# Patient Record
Sex: Male | Born: 1945 | Race: White | Hispanic: No | Marital: Married | Smoking: Former smoker
Health system: Southern US, Community
[De-identification: ages and names within clinical notes are randomized; demographics above are authoritative.]

## PROBLEM LIST (undated history)

## (undated) DIAGNOSIS — I5032 Chronic diastolic (congestive) heart failure: Secondary | ICD-10-CM

## (undated) DIAGNOSIS — I480 Paroxysmal atrial fibrillation: Secondary | ICD-10-CM

## (undated) DIAGNOSIS — G4733 Obstructive sleep apnea (adult) (pediatric): Secondary | ICD-10-CM

## (undated) DIAGNOSIS — F0280 Dementia in other diseases classified elsewhere without behavioral disturbance: Secondary | ICD-10-CM

## (undated) DIAGNOSIS — I639 Cerebral infarction, unspecified: Secondary | ICD-10-CM

## (undated) DIAGNOSIS — I152 Hypertension secondary to endocrine disorders: Secondary | ICD-10-CM

## (undated) DIAGNOSIS — I2510 Atherosclerotic heart disease of native coronary artery without angina pectoris: Secondary | ICD-10-CM

## (undated) DIAGNOSIS — N529 Male erectile dysfunction, unspecified: Secondary | ICD-10-CM

## (undated) DIAGNOSIS — I219 Acute myocardial infarction, unspecified: Secondary | ICD-10-CM

## (undated) DIAGNOSIS — R931 Abnormal findings on diagnostic imaging of heart and coronary circulation: Secondary | ICD-10-CM

## (undated) DIAGNOSIS — Z955 Presence of coronary angioplasty implant and graft: Secondary | ICD-10-CM

## (undated) DIAGNOSIS — E1169 Type 2 diabetes mellitus with other specified complication: Secondary | ICD-10-CM

## (undated) DIAGNOSIS — I5189 Other ill-defined heart diseases: Secondary | ICD-10-CM

## (undated) DIAGNOSIS — E119 Type 2 diabetes mellitus without complications: Secondary | ICD-10-CM

## (undated) DIAGNOSIS — I255 Ischemic cardiomyopathy: Secondary | ICD-10-CM

## (undated) DIAGNOSIS — R053 Chronic cough: Secondary | ICD-10-CM

## (undated) DIAGNOSIS — I872 Venous insufficiency (chronic) (peripheral): Secondary | ICD-10-CM

## (undated) DIAGNOSIS — G309 Alzheimer's disease, unspecified: Secondary | ICD-10-CM

## (undated) DIAGNOSIS — I252 Old myocardial infarction: Secondary | ICD-10-CM

## (undated) DIAGNOSIS — IMO0002 Reserved for concepts with insufficient information to code with codable children: Secondary | ICD-10-CM

## (undated) DIAGNOSIS — M109 Gout, unspecified: Secondary | ICD-10-CM

## (undated) DIAGNOSIS — R9430 Abnormal result of cardiovascular function study, unspecified: Secondary | ICD-10-CM

## (undated) DIAGNOSIS — L308 Other specified dermatitis: Secondary | ICD-10-CM

## (undated) DIAGNOSIS — N400 Enlarged prostate without lower urinary tract symptoms: Secondary | ICD-10-CM

## (undated) DIAGNOSIS — G4730 Sleep apnea, unspecified: Secondary | ICD-10-CM

## (undated) DIAGNOSIS — E291 Testicular hypofunction: Secondary | ICD-10-CM

## (undated) DIAGNOSIS — R269 Unspecified abnormalities of gait and mobility: Secondary | ICD-10-CM

## (undated) HISTORY — DX: Benign prostatic hyperplasia without lower urinary tract symptoms: N40.0

## (undated) HISTORY — DX: Acute myocardial infarction, unspecified: I21.9

## (undated) HISTORY — DX: Cerebral infarction, unspecified: I63.9

## (undated) HISTORY — DX: Testicular hypofunction: E29.1

## (undated) HISTORY — DX: Male erectile dysfunction, unspecified: N52.9

## (undated) HISTORY — PX: OTHER SURGICAL HISTORY: SHX169

---

## 2007-11-18 DIAGNOSIS — I639 Cerebral infarction, unspecified: Secondary | ICD-10-CM

## 2007-11-18 HISTORY — DX: Cerebral infarction, unspecified: I63.9

## 2008-01-29 ENCOUNTER — Ambulatory Visit: Payer: Self-pay | Admitting: Ophthalmology

## 2008-02-15 ENCOUNTER — Ambulatory Visit: Payer: Self-pay | Admitting: Internal Medicine

## 2011-10-21 ENCOUNTER — Ambulatory Visit: Payer: Self-pay | Admitting: Gastroenterology

## 2013-02-10 ENCOUNTER — Emergency Department: Payer: Self-pay | Admitting: Emergency Medicine

## 2014-02-21 ENCOUNTER — Ambulatory Visit: Payer: Self-pay | Admitting: Internal Medicine

## 2014-02-21 LAB — CBC CANCER CENTER
BASOS PCT: 1.1 %
Basophil #: 0.1 x10 3/mm (ref 0.0–0.1)
EOS ABS: 0.1 x10 3/mm (ref 0.0–0.7)
Eosinophil %: 1.9 %
HCT: 40.7 % (ref 40.0–52.0)
HGB: 13.7 g/dL (ref 13.0–18.0)
LYMPHS ABS: 1.4 x10 3/mm (ref 1.0–3.6)
Lymphocyte %: 26 %
MCH: 28.4 pg (ref 26.0–34.0)
MCHC: 33.6 g/dL (ref 32.0–36.0)
MCV: 84 fL (ref 80–100)
MONO ABS: 0.4 x10 3/mm (ref 0.2–1.0)
MONOS PCT: 6.6 %
NEUTROS ABS: 3.6 x10 3/mm (ref 1.4–6.5)
Neutrophil %: 64.4 %
Platelet: 171 x10 3/mm (ref 150–440)
RBC: 4.83 10*6/uL (ref 4.40–5.90)
RDW: 14.2 % (ref 11.5–14.5)
WBC: 5.5 x10 3/mm (ref 3.8–10.6)

## 2014-02-21 LAB — RETICULOCYTES
ABSOLUTE RETIC COUNT: 0.098 10*6/uL (ref 0.019–0.186)
Reticulocyte: 2 % (ref 0.4–3.1)

## 2014-02-21 LAB — IRON AND TIBC
IRON BIND. CAP.(TOTAL): 320 ug/dL (ref 250–450)
IRON SATURATION: 26 %
Iron: 82 ug/dL (ref 65–175)
Unbound Iron-Bind.Cap.: 238 ug/dL

## 2014-02-21 LAB — FERRITIN: Ferritin (ARMC): 29 ng/mL (ref 8–388)

## 2014-03-17 ENCOUNTER — Ambulatory Visit: Payer: Self-pay | Admitting: Internal Medicine

## 2014-03-21 LAB — CBC CANCER CENTER
BASOS ABS: 0 x10 3/mm (ref 0.0–0.1)
BASOS PCT: 0.7 %
EOS ABS: 0.1 x10 3/mm (ref 0.0–0.7)
Eosinophil %: 2.4 %
HCT: 41.6 % (ref 40.0–52.0)
HGB: 14.4 g/dL (ref 13.0–18.0)
Lymphocyte #: 1.5 x10 3/mm (ref 1.0–3.6)
Lymphocyte %: 29.6 %
MCH: 29 pg (ref 26.0–34.0)
MCHC: 34.6 g/dL (ref 32.0–36.0)
MCV: 84 fL (ref 80–100)
MONO ABS: 0.3 x10 3/mm (ref 0.2–1.0)
Monocyte %: 6.8 %
Neutrophil #: 3 x10 3/mm (ref 1.4–6.5)
Neutrophil %: 60.5 %
PLATELETS: 141 x10 3/mm — AB (ref 150–440)
RBC: 4.95 10*6/uL (ref 4.40–5.90)
RDW: 14.3 % (ref 11.5–14.5)
WBC: 4.9 x10 3/mm (ref 3.8–10.6)

## 2014-04-17 ENCOUNTER — Ambulatory Visit: Payer: Self-pay | Admitting: Internal Medicine

## 2014-04-18 ENCOUNTER — Ambulatory Visit: Payer: Self-pay | Admitting: Internal Medicine

## 2014-04-18 LAB — CBC CANCER CENTER
BASOS ABS: 0.1 x10 3/mm (ref 0.0–0.1)
BASOS PCT: 1 %
Eosinophil #: 0.1 x10 3/mm (ref 0.0–0.7)
Eosinophil %: 2.1 %
HCT: 41.5 % (ref 40.0–52.0)
HGB: 14 g/dL (ref 13.0–18.0)
LYMPHS PCT: 28.2 %
Lymphocyte #: 1.5 x10 3/mm (ref 1.0–3.6)
MCH: 28.7 pg (ref 26.0–34.0)
MCHC: 33.8 g/dL (ref 32.0–36.0)
MCV: 85 fL (ref 80–100)
MONOS PCT: 7.7 %
Monocyte #: 0.4 x10 3/mm (ref 0.2–1.0)
NEUTROS ABS: 3.3 x10 3/mm (ref 1.4–6.5)
Neutrophil %: 61 %
Platelet: 149 x10 3/mm — ABNORMAL LOW (ref 150–440)
RBC: 4.89 10*6/uL (ref 4.40–5.90)
RDW: 13.9 % (ref 11.5–14.5)
WBC: 5.4 x10 3/mm (ref 3.8–10.6)

## 2014-05-17 ENCOUNTER — Ambulatory Visit: Payer: Self-pay | Admitting: Internal Medicine

## 2014-05-30 ENCOUNTER — Ambulatory Visit: Payer: Self-pay | Admitting: Internal Medicine

## 2014-08-13 DIAGNOSIS — E119 Type 2 diabetes mellitus without complications: Secondary | ICD-10-CM | POA: Insufficient documentation

## 2014-08-13 DIAGNOSIS — E538 Deficiency of other specified B group vitamins: Secondary | ICD-10-CM | POA: Insufficient documentation

## 2014-08-13 DIAGNOSIS — G4733 Obstructive sleep apnea (adult) (pediatric): Secondary | ICD-10-CM | POA: Insufficient documentation

## 2014-08-13 DIAGNOSIS — E1159 Type 2 diabetes mellitus with other circulatory complications: Secondary | ICD-10-CM | POA: Insufficient documentation

## 2014-08-13 DIAGNOSIS — E7800 Pure hypercholesterolemia, unspecified: Secondary | ICD-10-CM | POA: Insufficient documentation

## 2014-08-13 DIAGNOSIS — D61818 Other pancytopenia: Secondary | ICD-10-CM | POA: Insufficient documentation

## 2014-08-13 DIAGNOSIS — I10 Essential (primary) hypertension: Secondary | ICD-10-CM | POA: Insufficient documentation

## 2014-08-13 DIAGNOSIS — E291 Testicular hypofunction: Secondary | ICD-10-CM | POA: Insufficient documentation

## 2014-08-13 DIAGNOSIS — H539 Unspecified visual disturbance: Secondary | ICD-10-CM | POA: Insufficient documentation

## 2014-08-13 DIAGNOSIS — N529 Male erectile dysfunction, unspecified: Secondary | ICD-10-CM | POA: Insufficient documentation

## 2014-08-13 DIAGNOSIS — I69998 Other sequelae following unspecified cerebrovascular disease: Secondary | ICD-10-CM

## 2014-09-20 DIAGNOSIS — Z8673 Personal history of transient ischemic attack (TIA), and cerebral infarction without residual deficits: Secondary | ICD-10-CM | POA: Insufficient documentation

## 2014-09-20 DIAGNOSIS — R0681 Apnea, not elsewhere classified: Secondary | ICD-10-CM | POA: Insufficient documentation

## 2014-09-20 DIAGNOSIS — R079 Chest pain, unspecified: Secondary | ICD-10-CM | POA: Insufficient documentation

## 2014-11-17 DIAGNOSIS — I219 Acute myocardial infarction, unspecified: Secondary | ICD-10-CM

## 2014-11-17 HISTORY — PX: CARDIAC CATHETERIZATION: SHX172

## 2014-11-17 HISTORY — DX: Acute myocardial infarction, unspecified: I21.9

## 2015-01-25 DIAGNOSIS — G3184 Mild cognitive impairment, so stated: Secondary | ICD-10-CM | POA: Insufficient documentation

## 2015-01-25 DIAGNOSIS — F0390 Unspecified dementia without behavioral disturbance: Secondary | ICD-10-CM | POA: Insufficient documentation

## 2015-01-25 DIAGNOSIS — F09 Unspecified mental disorder due to known physiological condition: Secondary | ICD-10-CM | POA: Insufficient documentation

## 2015-02-20 DIAGNOSIS — Z79899 Other long term (current) drug therapy: Secondary | ICD-10-CM | POA: Insufficient documentation

## 2015-06-27 ENCOUNTER — Encounter: Payer: Self-pay | Admitting: *Deleted

## 2015-07-05 ENCOUNTER — Encounter: Payer: Self-pay | Admitting: Urology

## 2015-07-05 ENCOUNTER — Ambulatory Visit: Payer: Self-pay | Admitting: Urology

## 2015-07-17 ENCOUNTER — Encounter: Payer: Self-pay | Admitting: Urology

## 2015-07-17 ENCOUNTER — Ambulatory Visit (INDEPENDENT_AMBULATORY_CARE_PROVIDER_SITE_OTHER): Payer: Medicare Other | Admitting: Urology

## 2015-07-17 ENCOUNTER — Other Ambulatory Visit: Payer: Self-pay | Admitting: Urology

## 2015-07-17 ENCOUNTER — Telehealth: Payer: Self-pay

## 2015-07-17 VITALS — BP 151/84 | HR 63 | Ht 71.75 in | Wt 278.5 lb

## 2015-07-17 DIAGNOSIS — N528 Other male erectile dysfunction: Secondary | ICD-10-CM

## 2015-07-17 DIAGNOSIS — N529 Male erectile dysfunction, unspecified: Secondary | ICD-10-CM

## 2015-07-17 DIAGNOSIS — N401 Enlarged prostate with lower urinary tract symptoms: Secondary | ICD-10-CM | POA: Diagnosis not present

## 2015-07-17 DIAGNOSIS — N138 Other obstructive and reflux uropathy: Secondary | ICD-10-CM | POA: Insufficient documentation

## 2015-07-17 MED ORDER — SILDENAFIL CITRATE 20 MG PO TABS: 20.0000 mg | ORAL_TABLET | ORAL | Status: DC | PRN

## 2015-07-17 MED ORDER — TAMSULOSIN HCL 0.4 MG PO CAPS: 0.4000 mg | ORAL_CAPSULE | Freq: Every day | ORAL | Status: DC

## 2015-07-17 NOTE — Telephone Encounter (Signed)
Pt called stating he went to have his generic cialis filled and the script currently says one daily. Per pt the pharmacist told him the script needs to say 3-5 prior to intercourse or prn. Please advise.

## 2015-07-17 NOTE — Progress Notes (Signed)
07/17/2015 10:36 AM   [name] 1946/10/11 [id number]  Referring provider: No referring provider defined for this encounter.  Chief Complaint  Patient presents with  . Erectile Dysfunction    1 year recheck  . Benign Prostatic Hypertrophy    HPI: Patient is a 69 year old white male with erectile dysfunction and BPH with LUTS who presents today for one year recheck.   BPH with LUTS His IPSS score today is 5, which is mild lower urinary tract symptomatology. He is mostly satisfied with his quality life due to his urinary symptoms.     His major complaint today nocturia 1.  He has had these symptoms for many years.  He denies any dysuria, hematuria or suprapubic pain.   He currently taking tamsulosin 0.4 mg daily.     He also denies any recent UTI's, fevers, chills, nausea or vomiting.  He does not have a family history of PCa.      IPSS      07/17/15 0800       International Prostate Symptom Score   How often have you had the sensation of not emptying your bladder? Less than 1 in 5     How often have you had to urinate less than every two hours? Less than 1 in 5 times     How often have you found you stopped and started again several times when you urinated? Less than 1 in 5 times     How often have you found it difficult to postpone urination? Less than 1 in 5 times     How often have you had a weak urinary stream? Not at All     How often have you had to strain to start urination? Not at All     How many times did you typically get up at night to urinate? 1 Time     Total IPSS Score 5     Quality of Life due to urinary symptoms   If you were to spend the rest of your life with your urinary condition just the way it is now how would you feel about that? Mostly Satisfied        Score:  1-7 Mild 8-19 Moderate 20-35 Severe   Erectile dysfunction His SHIM score is 5, which is severe ED.   He has been having difficulty with erections for several years.   His  major complaint is maintaining erections.  His libido is preserved.   His risk factors for ED are BPH, hypertension, advanced age, history of stroke and memory loss .  He denies any painful erections or curvatures with his erections.   He has tried PDE 5 inhibitors and they are effective, but he finds them cost prohibitive.  He is currently taking sildenafil 20 mg once daily.      SHIM      07/17/15 0851       SHIM: Over the last 6 months:   How do you rate your confidence that you could get and keep an erection? Very Low     When you had erections with sexual stimulation, how often were your erections hard enough for penetration (entering your partner)? Almost Never or Never     During sexual intercourse, how often were you able to maintain your erection after you had penetrated (entered) your partner? Extremely Difficult     During sexual intercourse, how difficult was it to maintain your erection to completion of intercourse? Extremely Difficult  When you attempted sexual intercourse, how often was it satisfactory for you? Extremely Difficult     SHIM Total Score   SHIM 5        Score: 1-7 Severe ED 8-11 Moderate ED 12-16 Mild-Moderate ED 17-21 Mild ED 22-25 No ED   PMH: Past Medical History  Diagnosis Date  . Stroke   . Erectile dysfunction   . BPH (benign prostatic hyperplasia)   . Erectile dysfunction   . Hypogonadism in male     Surgical History: Past Surgical History  Procedure Laterality Date  . Abdominal cyst surgery      Home Medications:    Medication List       This list is accurate as of: 07/17/15 10:36 AM.  Always use your most recent med list.               allopurinol 300 MG tablet  Commonly known as:  ZYLOPRIM  Take 300 mg by mouth daily.     aspirin 81 MG tablet  Take 81 mg by mouth daily.     cyanocobalamin 1000 MCG/ML injection  Commonly known as:  (VITAMIN B-12)  Inject into the muscle.     dipyridamole-aspirin 200-25 MG per 12  hr capsule  Commonly known as:  AGGRENOX  Take 1 capsule by mouth 2 (two) times daily.     donepezil 5 MG tablet  Commonly known as:  ARICEPT     ramipril 10 MG capsule  Commonly known as:  ALTACE  Take 10 mg by mouth daily.     sildenafil 20 MG tablet  Commonly known as:  REVATIO  Take 20 mg by mouth 3 (three) times daily.     simvastatin 40 MG tablet  Commonly known as:  ZOCOR  Take 40 mg by mouth daily.     tamsulosin 0.4 MG Caps capsule  Commonly known as:  FLOMAX  Take 1 capsule (0.4 mg total) by mouth daily after supper.     Vitamin D (Cholecalciferol) 1000 UNITS Caps  Take by mouth daily.        Allergies: No Known Allergies  Family History: Family History  Problem Relation Age of Onset  . Kidney disease Neg Hx   . Prostate cancer Neg Hx   . Bladder Cancer Neg Hx     Social History:  reports that he has quit smoking. He does not have any smokeless tobacco history on file. He reports that he does not drink alcohol or use illicit drugs.  ROS: UROLOGY Frequent Urination?: No Hard to postpone urination?: No Burning/pain with urination?: No Get up at night to urinate?: No Leakage of urine?: No Urine stream starts and stops?: No Trouble starting stream?: No Do you have to strain to urinate?: No Blood in urine?: No Urinary tract infection?: No Sexually transmitted disease?: No Injury to kidneys or bladder?: No Painful intercourse?: No Weak stream?: No Erection problems?: No Penile pain?: No  Gastrointestinal Nausea?: No Vomiting?: No Indigestion/heartburn?: No Diarrhea?: No Constipation?: No  Constitutional Fever: No Night sweats?: No Weight loss?: No Fatigue?: No  Skin Skin rash/lesions?: No Itching?: No  Eyes Blurred vision?: No Double vision?: No  Ears/Nose/Throat Sore throat?: No Sinus problems?: No  Hematologic/Lymphatic Swollen glands?: No Easy bruising?: No  Cardiovascular Leg swelling?: No Chest pain?:  No  Respiratory Cough?: No Shortness of breath?: No  Endocrine Excessive thirst?: No  Musculoskeletal Back pain?: No Joint pain?: No  Neurological Headaches?: No Dizziness?: No  Psychologic Depression?: No Anxiety?: No  Physical Exam: BP 151/84 mmHg  Pulse 63  Ht 5' 11.75" (1.822 m)  Wt 278 lb 8 oz (126.327 kg)  BMI 38.05 kg/m2  GU: Patient with circumcised.  Urethral meatus is patent.  No penile discharge. No penile lesions or rashes. Scrotum without lesions, cysts, rashes and/or edema.  Testicles are located scrotally bilaterally. No masses are appreciated in the testicles. Left and right epididymis are normal. Rectal: Patient with  normal sphincter tone. Perineum without scarring or rashes. No rectal masses are appreciated. Prostate is approximately 45 grams, no nodules are appreciated. Seminal vesicles are normal.   Laboratory Data: Lab Results  Component Value Date   WBC 5.4 04/18/2014   HGB 14.0 04/18/2014   HCT 41.5 04/18/2014   MCV 85 04/18/2014   PLT 149* 04/18/2014   PSA history:  1.4 ng/mL on 12/22/2012             1.3 ng/mL on 07/04/2013  1.5 ng/mL on 07/04/2014   Assessment & Plan:    1. BPH (benign prostatic hyperplasia) with LUTS:   Patient's IPSS score is 5/2.   His DRE demonstrates mild enlargement, no nodules.  He will continue the tamsulosin 0.4 mg daily. I have sent a refill to his pharmacy  He will follow up in 12 months for a PSA, DRE and an IPSS.    - PSA  2. Erectile dysfunction:   SHIM score is 5, which is severe ED.  He is only taking one sildenafil 25 mg tablet daily which is subtherapeutic.   I explained to him that he would need to take 3-5 tablets of the sildenafil 30 minutes prior to intercourse on an empty stomach.  Patient will contact us with results. He will return in 1 year for [facility] IM score and symptom recheck.   Return in about 1 year (around 07/16/2016) for IPSS and SHIM.  Michiel Cowboy, PA-C  [facility] [address], [address] [place], [place] [address] [phone]

## 2015-07-17 NOTE — Telephone Encounter (Signed)
Attempted to contact the pt, no answer. LMOM to return my call.  

## 2015-07-17 NOTE — Telephone Encounter (Signed)
I spoke w/ pt and informed him that he could pick up a prescription for the Sildenafil , # 50 at the office.

## 2015-07-17 NOTE — Telephone Encounter (Signed)
The medication is sildenafil. It is 20 mg daily he needs to take 3-5 tablets 30 minutes prior to intercourse on an empty stomach.

## 2015-07-17 NOTE — Patient Instructions (Signed)
Sildenafil 20 mg.  Take 3 to 5 tablets prior to 30 minutes prior to intercourse on an empty stomach.

## 2015-07-18 LAB — PSA: Prostate Specific Ag, Serum: 1.3 ng/mL (ref 0.0–4.0)

## 2015-07-18 NOTE — Telephone Encounter (Signed)
One year is fine

## 2015-07-18 NOTE — Telephone Encounter (Signed)
-----   Message from Harle Battiest, PA-C sent at 07/18/2015  1:23 PM EDT ----- Patient's PSA is stable.  We will see him in 6 months.  PSA to be drawn before his next appointment.

## 2015-07-18 NOTE — Telephone Encounter (Signed)
Spoke with pt and made aware of PSA results. Pt stated he made a f/u in 1year. Would that be ok or does he need to come back in 42mo. Please advise.

## 2015-07-19 NOTE — Telephone Encounter (Signed)
Spoke with pt in reference to f/u appt. Pt voiced understanding.  

## 2015-08-15 DIAGNOSIS — E559 Vitamin D deficiency, unspecified: Secondary | ICD-10-CM | POA: Insufficient documentation

## 2015-09-19 ENCOUNTER — Ambulatory Visit
Admission: EM | Admit: 2015-09-19 | Discharge: 2015-09-19 | Payer: Medicare Other | Attending: Family Medicine | Admitting: Family Medicine

## 2015-09-19 ENCOUNTER — Other Ambulatory Visit: Payer: Self-pay

## 2015-09-19 DIAGNOSIS — R079 Chest pain, unspecified: Secondary | ICD-10-CM | POA: Diagnosis present

## 2015-09-19 DIAGNOSIS — Z9889 Other specified postprocedural states: Secondary | ICD-10-CM | POA: Insufficient documentation

## 2015-09-19 DIAGNOSIS — I2129 ST elevation (STEMI) myocardial infarction involving other sites: Secondary | ICD-10-CM | POA: Diagnosis not present

## 2015-09-19 DIAGNOSIS — I213 ST elevation (STEMI) myocardial infarction of unspecified site: Secondary | ICD-10-CM | POA: Diagnosis not present

## 2015-09-19 DIAGNOSIS — Z87891 Personal history of nicotine dependence: Secondary | ICD-10-CM | POA: Diagnosis not present

## 2015-09-19 DIAGNOSIS — Z8673 Personal history of transient ischemic attack (TIA), and cerebral infarction without residual deficits: Secondary | ICD-10-CM | POA: Insufficient documentation

## 2015-09-19 NOTE — ED Notes (Signed)
Patient states he is having chest pain which started at approximately 745am today.

## 2015-09-19 NOTE — ED Provider Notes (Signed)
Subjective:  James GeorgesKenneth [name] is a 69 y.o. male who presents for evaluation of chest pain Onset was around 8am this morning. The patient admits to chest discomfort that is a left sided pressure.  Associated symptom diaphoresis. Patient's cardiac risk factors are obesity, diabetes, and past smoker. Family Hx father dies of MI in 6860s. Patient denies taking any other meds today besides ASA.   Patient Active Problem List   Diagnosis Date Noted  . 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  . 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   Past Medical History  Diagnosis Date  . Stroke   . Erectile dysfunction   . BPH (benign prostatic hyperplasia)   . Erectile dysfunction   . Hypogonadism in male     Past Surgical History  Procedure Laterality Date  . Abdominal cyst surgery       (Not in a hospital admission) No Known Allergies  Social History  Substance Use Topics  . Smoking status: Former Games developermoker  . Smokeless tobacco: Not on file     Comment: "years ago"  . Alcohol Use: No    Family History  Problem Relation Age of Onset  . Kidney disease Neg Hx   . Prostate cancer Neg Hx   . Bladder Cancer Neg Hx     Review of Systems Negative except mentioned above.   Objective:  Patient Vitals for the past 8 hrs:  BP Pulse Resp SpO2  09/19/15 0818 (!) 143/87 mmHg 78 (!) 22 98 %   Vitals as per Epic  GENERAL: NAD HEENT: no pharyngeal erythema, no exudate RESP: CTA B CARD: RRR, no chest wall tenderness NEURO: CN II-XII grossly intact  MSK: -Homans   ECG: sinus rhythm, ST elevation anterior leads, low voltage QRS-suggestive of acute MI   Assessment:  Myocardial Infarction   Plan:  1. ECG Ordered, IV placed, FSBS 191 2. NITRO SL x 2 3. 2L O2 [place], Baby ASA  (3) taken at approx. 8am   4. Nurse to call EMS for transport to ED  Jolene ProvostKirtida Marzella Miracle, MD 09/19/15 [phone]

## 2015-09-21 DIAGNOSIS — R310 Gross hematuria: Secondary | ICD-10-CM | POA: Insufficient documentation

## 2015-09-21 DIAGNOSIS — I2510 Atherosclerotic heart disease of native coronary artery without angina pectoris: Secondary | ICD-10-CM | POA: Insufficient documentation

## 2015-09-21 DIAGNOSIS — I5021 Acute systolic (congestive) heart failure: Secondary | ICD-10-CM | POA: Insufficient documentation

## 2015-09-21 DIAGNOSIS — I213 ST elevation (STEMI) myocardial infarction of unspecified site: Secondary | ICD-10-CM | POA: Insufficient documentation

## 2015-09-22 DIAGNOSIS — R58 Hemorrhage, not elsewhere classified: Secondary | ICD-10-CM | POA: Insufficient documentation

## 2015-09-22 DIAGNOSIS — D62 Acute posthemorrhagic anemia: Secondary | ICD-10-CM | POA: Insufficient documentation

## 2015-10-09 ENCOUNTER — Other Ambulatory Visit: Payer: Self-pay | Admitting: Internal Medicine

## 2015-10-09 DIAGNOSIS — K769 Liver disease, unspecified: Secondary | ICD-10-CM

## 2015-10-10 DIAGNOSIS — I2109 ST elevation (STEMI) myocardial infarction involving other coronary artery of anterior wall: Secondary | ICD-10-CM | POA: Insufficient documentation

## 2015-10-24 ENCOUNTER — Encounter: Payer: Medicare Other | Attending: Cardiology | Admitting: *Deleted

## 2015-10-24 ENCOUNTER — Encounter: Payer: Self-pay | Admitting: *Deleted

## 2015-10-24 VITALS — Ht 70.5 in | Wt 262.3 lb

## 2015-10-24 DIAGNOSIS — I213 ST elevation (STEMI) myocardial infarction of unspecified site: Secondary | ICD-10-CM | POA: Diagnosis not present

## 2015-10-24 DIAGNOSIS — Z9861 Coronary angioplasty status: Secondary | ICD-10-CM | POA: Diagnosis present

## 2015-10-24 DIAGNOSIS — M109 Gout, unspecified: Secondary | ICD-10-CM | POA: Insufficient documentation

## 2015-10-24 DIAGNOSIS — N400 Enlarged prostate without lower urinary tract symptoms: Secondary | ICD-10-CM | POA: Insufficient documentation

## 2015-10-24 NOTE — Addendum Note (Signed)
Addended by: Suszanne FinchWRIGHT, Noella Kipnis P on: 10/24/2015 06:10 PM   Modules accepted: Orders, Medications

## 2015-10-24 NOTE — Progress Notes (Addendum)
Cardiac Individual Treatment Plan  Patient Details  Name: James [name] MRN: [id number] Date of Birth: 06/28/1946 Referring Provider:  Marcina MillardParaschos, Alexander, MD  Initial Encounter Date: Date: 10/24/15  Visit Diagnosis: ST elevation myocardial infarction (STEMI), unspecified artery (HCC) - Plan: CARDIAC REHAB 30 DAY REVIEW  S/P PTCA (percutaneous transluminal coronary angioplasty) - Plan: CARDIAC REHAB 30 DAY REVIEW  Patient's Home Medications on Admission:  Current outpatient prescriptions:  .  aspirin 81 MG tablet, Take 81 mg by mouth daily., Disp: , Rfl:  .  atorvastatin (LIPITOR) 80 MG tablet, Take 1 tablet by mouth., Disp: , Rfl:  .  cyanocobalamin (,VITAMIN B-12,) 1000 MCG/ML injection, Inject into the muscle., Disp: , Rfl:  .  Cyanocobalamin (RA VITAMIN B-12 TR) 1000 MCG TBCR, Take by mouth daily. , Disp: , Rfl:  .  finasteride (PROSCAR) 5 MG tablet, Take by mouth., Disp: , Rfl:  .  metoprolol succinate (TOPROL-XL) 50 MG 24 hr tablet, Take by mouth., Disp: , Rfl:  .  ramipril (ALTACE) 10 MG capsule, Take 10 mg by mouth daily., Disp: , Rfl:  .  tamsulosin (FLOMAX) 0.4 MG CAPS capsule, Take 1 capsule (0.4 mg total) by mouth daily after supper., Disp: 90 capsule, Rfl: 3 .  ticagrelor (BRILINTA) 90 MG TABS tablet, Take by mouth., Disp: , Rfl:  .  Vitamin D, Cholecalciferol, 1000 UNITS CAPS, Take by mouth daily., Disp: , Rfl:   Past Medical History: Past Medical History  Diagnosis Date  . Stroke (HCC)   . Erectile dysfunction   . BPH (benign prostatic hyperplasia)   . Erectile dysfunction   . Hypogonadism in male     Tobacco Use: History  Smoking status  . Former Smoker -- 1.50 packs/day for 25 years  . Quit date: 11/17/1984  Smokeless tobacco  . Never Used    Comment: "years ago"    Labs: Recent Review Flowsheet Data    There is no flowsheet data to display.       Exercise Target Goals: Date: 10/24/15  Exercise Program Goal: Individual exercise prescription set  with THRR, safety & activity barriers. Participant demonstrates ability to understand and report RPE using BORG scale, to self-measure pulse accurately, and to acknowledge the importance of the exercise prescription.  Exercise Prescription Goal: Starting with aerobic activity 30 plus minutes a day, 3 days per week for initial exercise prescription. Provide home exercise prescription and guidelines that participant acknowledges understanding prior to discharge.  Activity Barriers & Risk Stratification:     Activity Barriers & Risk Stratification - 10/24/15 1730    Activity Barriers & Risk Stratification   Activity Barriers None   Risk Stratification High      6 Minute Walk:     6 Minute Walk      10/24/15 1436       6 Minute Walk   Phase Initial     Distance 1300 feet     Walk Time 6 minutes     Resting HR 72 bpm     Resting BP 124/60 mmHg     Max Ex. HR 111 bpm     Max Ex. BP 144/62 mmHg     RPE 8     Symptoms No        Initial Exercise Prescription:     Initial Exercise Prescription - 10/24/15 1400    Date of Initial Exercise Prescription   Date 10/24/15   Treadmill   MPH 2   Grade 0   Minutes 10  Bike   Level 0.2   Minutes 10   Recumbant Bike   Level 3   RPM 40   Watts 25   Minutes 15   NuStep   Level 3   Watts 30   Minutes 15   Arm Ergometer   Level 1   Watts 8   Minutes 10   Arm/Foot Ergometer   Level 4   Watts 15   Minutes 10   Cybex   Level 2   RPM 50   Minutes 10   Recumbant Elliptical   Level 1   RPM 40   Watts 10   Minutes 10   Elliptical   Level 1   Speed 3   Minutes 1   REL-XR   Level 2   Watts 30   Minutes 15   T5 Nustep   Level 2   Watts 15   Minutes 15   Biostep-RELP   Level 2   Watts 15   Minutes 15   Prescription Details   Frequency (times per week) 3   Duration Progress to 30 minutes of continuous aerobic without signs/symptoms of physical distress   Intensity   THRR REST +  30   Ratings of Perceived  Exertion 11-15   Progression Continue progressive overload as per policy without signs/symptoms or physical distress.   Resistance Training   Training Prescription Yes   Weight 2   Reps 10-15      Exercise Prescription Changes:   Discharge Exercise Prescription (Final Exercise Prescription Changes):   Nutrition:  Target Goals: Understanding of nutrition guidelines, daily intake of sodium 1500mg , cholesterol 200mg , calories 30% from fat and 7% or less from saturated fats, daily to have 5 or more servings of fruits and vegetables.  Biometrics:     Pre Biometrics - 10/24/15 1436    Pre Biometrics   Height 5' 10.5" (1.791 m)   Weight 262 lb 4.8 oz (118.978 kg)   Waist Circumference 47.75 inches   Hip Circumference 49 inches   Waist to Hip Ratio 0.97 %   BMI (Calculated) 37.2       Nutrition Therapy Plan and Nutrition Goals:   Nutrition Discharge: Rate Your Plate Scores:   Nutrition Goals Re-Evaluation:   Psychosocial: Target Goals: Acknowledge presence or absence of depression, maximize coping skills, provide positive support system. Participant is able to verbalize types and ability to use techniques and skills needed for reducing stress and depression.  Initial Review & Psychosocial Screening:     Initial Psych Review & Screening - 10/24/15 1741    Family Dynamics   Good Support System? Yes   Barriers   Psychosocial barriers to participate in program There are no identifiable barriers or psychosocial needs.   Screening Interventions   Interventions Encouraged to exercise  Pt. has a good support system.  He has his wife James [name], 2 children and 3 grandchildren. He states he does not feel depressed.  He has an affiliation with a [employer].        Quality of Life Scores:     Quality of Life - 10/24/15 1743    Quality of Life Scores   Health/Function Pre 22.32 %   Socioeconomic Pre 30 %   Psych/Spiritual Pre 27.43 %   Family Pre 28.8 %    GLOBAL Pre 26.02 %      PHQ-9:     Recent Review Flowsheet Data    Depression screen [facility] 2/9 10/24/2015   Decreased Interest  0   Down, Depressed, Hopeless 0   PHQ - 2 Score 0   Altered sleeping 1   Tired, decreased energy 1   Change in appetite 0   Feeling bad or failure about yourself  0   Trouble concentrating 0   Moving slowly or fidgety/restless 0   Suicidal thoughts 0   PHQ-9 Score 2   Difficult doing work/chores Not difficult at all      Psychosocial Evaluation and Intervention:   Psychosocial Re-Evaluation:   Vocational Rehabilitation: Provide vocational rehab assistance to qualifying candidates.   Vocational Rehab Evaluation & Intervention:     Vocational Rehab - 10/24/15 1732    Initial Vocational Rehab Evaluation & Intervention   Assessment shows need for Vocational Rehabilitation No      Education: Education Goals: Education classes will be provided on a weekly basis, covering required topics. Participant will state understanding/return demonstration of topics presented.  Learning Barriers/Preferences:     Learning Barriers/Preferences - 10/24/15 1732    Learning Barriers/Preferences   Learning Barriers Sight;Hearing  Pt. had stroke 7 years ago which affected his  ability to process and respond to questions. Responses are delayed at times.  Also pt has limited peripheral vision.  Patient does not drive at night.     Learning Preferences Written Material      Education Topics: General Nutrition Guidelines/Fats and Fiber: -Group instruction provided by verbal, written material, models and posters to present the general guidelines for heart healthy nutrition. Gives an explanation and review of dietary fats and fiber.   Controlling Sodium/Reading Food Labels: -Group verbal and written material supporting the discussion of sodium use in heart healthy nutrition. Review and explanation with models, verbal and written materials for utilization of the  food label.   Exercise Physiology & Risk Factors: - Group verbal and written instruction with models to review the exercise physiology of the cardiovascular system and associated critical values. Details cardiovascular disease risk factors and the goals associated with each risk factor.   Aerobic Exercise & Resistance Training: - Gives group verbal and written discussion on the health impact of inactivity. On the components of aerobic and resistive training programs and the benefits of this training and how to safely progress through these programs.   Flexibility, Balance, General Exercise Guidelines: - Provides group verbal and written instruction on the benefits of flexibility and balance training programs. Provides general exercise guidelines with specific guidelines to those with heart or lung disease. Demonstration and skill practice provided.   Stress Management: - Provides group verbal and written instruction about the health risks of elevated stress, cause of high stress, and healthy ways to reduce stress.   Depression: - Provides group verbal and written instruction on the correlation between heart/lung disease and depressed mood, treatment options, and the stigmas associated with seeking treatment.   Anatomy & Physiology of the Heart: - Group verbal and written instruction and models provide basic cardiac anatomy and physiology, with the coronary electrical and arterial systems. Review of: AMI, Angina, Valve disease, Heart Failure, Cardiac Arrhythmia, Pacemakers, and the ICD.   Cardiac Procedures: - Group verbal and written instruction and models to describe the testing methods done to diagnose heart disease. Reviews the outcomes of the test results. Describes the treatment choices: Medical Management, Angioplasty, or Coronary Bypass Surgery.   Cardiac Medications: - Group verbal and written instruction to review commonly prescribed medications for heart disease. Reviews the  medication, class of the drug, and side effects. Includes the steps  to properly store meds and maintain the prescription regimen.   Go Sex-Intimacy & Heart Disease, Get SMART - Goal Setting: - Group verbal and written instruction through game format to discuss heart disease and the return to sexual intimacy. Provides group verbal and written material to discuss and apply goal setting through the application of the S.M.A.R.T. Method.   Other Matters of the Heart: - Provides group verbal, written materials and models to describe Heart Failure, Angina, Valve Disease, and Diabetes in the realm of heart disease. Includes description of the disease process and treatment options available to the cardiac patient.   Exercise & Equipment Safety: - Individual verbal instruction and demonstration of equipment use and safety with use of the equipment.          Cardiac Rehab from 10/24/2015 in [facility]iac [facility]   Date  10/24/15   Educator  DW   Instruction Review Code  1- partially meets, needs review/practice      Infection Prevention: - Provides verbal and written material to individual with discussion of infection control including proper hand washing and proper equipment cleaning during exercise session.      Cardiac Rehab from 10/24/2015 in [facility] Cardiac Rehab   Date  10/24/15   Educator  DW   Instruction Review Code  2- meets goals/outcomes      Falls Prevention: - Provides verbal and written material to individual with discussion of falls prevention and safety.      Cardiac Rehab from 10/24/2015 in [facility]   Date  10/24/15   Educator  DW   Instruction Review Code  2- meets goals/outcomes      Diabetes: - Individual verbal and written instruction to review signs/symptoms of diabetes, desired ranges of glucose level fasting, after meals and with exercise. Advice that pre and post exercise glucose checks will be done for 3 sessions at entry of program.      Cardiac Rehab from  10/24/2015 in [facility] Cardiac Rehab   Date  10/24/15   Educator  DW   Instruction Review Code  2- meets goals/outcomes       Knowledge Questionnaire Score:   Personal Goals and Risk Factors at Admission:     Personal Goals and Risk Factors at Admission - 10/24/15 1740    Personal Goals and Risk Factors on Admission    Weight Management Yes   Admit Weight 262 lb 4.8 oz (118.978 kg)   Goal Weight 200 lb (90.719 kg)   Increase Aerobic Exercise and Physical Activity Yes   Intervention While in program, learn and follow the exercise prescription taught. Start at a low level workload and increase workload after able to maintain previous level for 30 minutes. Increase time before increasing intensity.   Take Less Medication Yes   Intervention Learn your risk factors and begin the lifestyle modifications for risk factor control during your time in the program.   Understand more about Heart/Pulmonary Disease. Yes   Intervention While in program utilize professionals for any questions, and attend the education sessions. Great websites to use are www.americanheart.org or www.lung.org for reliable information.   Diabetes Yes   Goal Blood glucose control identified by blood glucose values, HgbA1C. Participant verbalizes understanding of the signs/symptoms of hyper/hypo glycemia, proper foot care and importance of medication and nutrition plan for blood glucose control.   Intervention Provide nutrition & aerobic exercise along with prescribed medications to achieve blood glucose in normal ranges: Fasting 65-99 mg/dL   Hypertension Yes  Goal Participant will see blood pressure controlled within the values of 140/31mm/Hg or within value directed by their physician.   Intervention Provide nutrition & aerobic exercise along with prescribed medications to achieve BP 140/90 or less.   Lipids Yes   Goal Cholesterol controlled with medications as prescribed, with individualized exercise RX and with  personalized nutrition plan. Value goals: LDL < 70mg , HDL > 40mg . Participant states understanding of desired cholesterol values and following prescriptions.   Intervention Provide nutrition & aerobic exercise along with prescribed medications to achieve LDL 70mg , HDL >40mg .   Stress Yes   Goal To meet with psychosocial counselor for stress and relaxation information and guidance. To state understanding of performing relaxation techniques and or identifying personal stressors.   Intervention Provide education on types of stress, identifiying stressors, and ways to cope with stress. Provide demonstration and active practice of relaxation techniques.      Personal Goals and Risk Factors Review:    Personal Goals Discharge (Final Personal Goals and Risk Factors Review):    ITP Comments:   Comments: Today James [name] attended his med review/cardiac rehab orientation. James [name] was accompanied by his wife James [name]. James [name] had a stroke 7 years ago which causes him to have a delay in processing or responding to questions. 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 Staff will need to give him time to respond and may need to repeat things due to hearing impairment. Patient states he does better if he can look directly at the person talking. Patient will start in the Tuesday/Thursday session on November 08, 2015 at 0830.

## 2015-10-24 NOTE — Progress Notes (Deleted)
Cardiac Individual Treatment Plan  Patient Details  Name: James [name] MRN: [id number] Date of Birth: 1946/10/06 Referring Provider:  Marcina Millard, MD  Initial Encounter Date: Date: 10/24/15  Visit Diagnosis: ST elevation myocardial infarction (STEMI), unspecified artery (HCC)  S/P PTCA (percutaneous transluminal coronary angioplasty)  Patient's Home Medications on Admission:  Current outpatient prescriptions:  .  aspirin 81 MG tablet, Take 81 mg by mouth daily., Disp: , Rfl:  .  atorvastatin (LIPITOR) 80 MG tablet, Take 1 tablet by mouth., Disp: , Rfl:  .  cyanocobalamin (,VITAMIN B-12,) 1000 MCG/ML injection, Inject into the muscle., Disp: , Rfl:  .  Cyanocobalamin (RA VITAMIN B-12 TR) 1000 MCG TBCR, Take by mouth daily. , Disp: , Rfl:  .  finasteride (PROSCAR) 5 MG tablet, Take by mouth., Disp: , Rfl:  .  metoprolol succinate (TOPROL-XL) 50 MG 24 hr tablet, Take by mouth., Disp: , Rfl:  .  ramipril (ALTACE) 10 MG capsule, Take 10 mg by mouth daily., Disp: , Rfl:  .  ramipril (ALTACE) 10 MG capsule, Take by mouth., Disp: , Rfl:  .  tamsulosin (FLOMAX) 0.4 MG CAPS capsule, Take 1 capsule (0.4 mg total) by mouth daily after supper., Disp: 90 capsule, Rfl: 3 .  ticagrelor (BRILINTA) 90 MG TABS tablet, Take by mouth., Disp: , Rfl:  .  Vitamin D, Cholecalciferol, 1000 UNITS CAPS, Take by mouth daily., Disp: , Rfl:  .  allopurinol (ZYLOPRIM) 300 MG tablet, Take 300 mg by mouth daily., Disp: , Rfl:  .  dipyridamole-aspirin (AGGRENOX) 200-25 MG per 12 hr capsule, Take 1 capsule by mouth 2 (two) times daily., Disp: , Rfl:  .  donepezil (ARICEPT) 5 MG tablet, , Disp: , Rfl:  .  sildenafil (REVATIO) 20 MG tablet, Take 1 tablet (20 mg total) by mouth continuous as needed (take 2-5 tablets as needed for sexual activity). (Patient not taking: Reported on 10/24/2015), Disp: 50 tablet, Rfl: 0 .  simvastatin (ZOCOR) 40 MG tablet, Take 40 mg by mouth daily., Disp: , Rfl:   Past Medical  History: Past Medical History  Diagnosis Date  . Stroke (HCC)   . Erectile dysfunction   . BPH (benign prostatic hyperplasia)   . Erectile dysfunction   . Hypogonadism in male     Tobacco Use: History  Smoking status  . Former Smoker -- 1.50 packs/day for 25 years  . Quit date: 11/17/1984  Smokeless tobacco  . Never Used    Comment: "years ago"    Labs: Recent Review Flowsheet Data    There is no flowsheet data to display.       Exercise Target Goals: Date: 10/24/15  Exercise Program Goal: Individual exercise prescription set with THRR, safety & activity barriers. Participant demonstrates ability to understand and report RPE using BORG scale, to self-measure pulse accurately, and to acknowledge the importance of the exercise prescription.  Exercise Prescription Goal: Starting with aerobic activity 30 plus minutes a day, 3 days per week for initial exercise prescription. Provide home exercise prescription and guidelines that participant acknowledges understanding prior to discharge.  Activity Barriers & Risk Stratification:     Activity Barriers & Risk Stratification - 10/24/15 1730    Activity Barriers & Risk Stratification   Activity Barriers None   Risk Stratification High      6 Minute Walk:     6 Minute Walk      10/24/15 1436       6 Minute Walk   Phase Initial  Distance 1300 feet     Walk Time 6 minutes     Resting HR 72 bpm     Resting BP 124/60 mmHg     Max Ex. HR 111 bpm     Max Ex. BP 144/62 mmHg     RPE 8     Symptoms No        Initial Exercise Prescription:     Initial Exercise Prescription - 10/24/15 1400    Date of Initial Exercise Prescription   Date 10/24/15   Treadmill   MPH 2   Grade 0   Minutes 10   Bike   Level 0.2   Minutes 10   Recumbant Bike   Level 3   RPM 40   Watts 25   Minutes 15   NuStep   Level 3   Watts 30   Minutes 15   Arm Ergometer   Level 1   Watts 8   Minutes 10   Arm/Foot Ergometer    Level 4   Watts 15   Minutes 10   Cybex   Level 2   RPM 50   Minutes 10   Recumbant Elliptical   Level 1   RPM 40   Watts 10   Minutes 10   Elliptical   Level 1   Speed 3   Minutes 1   REL-XR   Level 2   Watts 30   Minutes 15   T5 Nustep   Level 2   Watts 15   Minutes 15   Biostep-RELP   Level 2   Watts 15   Minutes 15   Prescription Details   Frequency (times per week) 3   Duration Progress to 30 minutes of continuous aerobic without signs/symptoms of physical distress   Intensity   THRR REST +  30   Ratings of Perceived Exertion 11-15   Progression Continue progressive overload as per policy without signs/symptoms or physical distress.   Resistance Training   Training Prescription Yes   Weight 2   Reps 10-15      Exercise Prescription Changes:   Discharge Exercise Prescription (Final Exercise Prescription Changes):   Nutrition:  Target Goals: Understanding of nutrition guidelines, daily intake of sodium 1500mg , cholesterol 200mg , calories 30% from fat and 7% or less from saturated fats, daily to have 5 or more servings of fruits and vegetables.  Biometrics:     Pre Biometrics - 10/24/15 1436    Pre Biometrics   Height 5' 10.5" (1.791 m)   Weight 262 lb 4.8 oz (118.978 kg)   Waist Circumference 47.75 inches   Hip Circumference 49 inches   Waist to Hip Ratio 0.97 %   BMI (Calculated) 37.2       Nutrition Therapy Plan and Nutrition Goals:   Nutrition Discharge: Rate Your Plate Scores:   Nutrition Goals Re-Evaluation:   Psychosocial: Target Goals: Acknowledge presence or absence of depression, maximize coping skills, provide positive support system. Participant is able to verbalize types and ability to use techniques and skills needed for reducing stress and depression.  Initial Review & Psychosocial Screening:     Initial Psych Review & Screening - 10/24/15 1741    Family Dynamics   Good Support System? Yes   Barriers    Psychosocial barriers to participate in program There are no identifiable barriers or psychosocial needs.   Screening Interventions   Interventions Encouraged to exercise  Pt. has a good support system.  He has his wife James [name],  2 children and 3 grandchildren. He states he does not feel depressed.  He has an affiliation with a [employer].        Quality of Life Scores:     Quality of Life - 10/24/15 1743    Quality of Life Scores   Health/Function Pre 22.32 %   Socioeconomic Pre 30 %   Psych/Spiritual Pre 27.43 %   Family Pre 28.8 %   GLOBAL Pre 26.02 %      PHQ-9:     Recent Review Flowsheet Data    Depression screen [facility] 2/9 10/24/2015   Decreased Interest 0   Down, Depressed, Hopeless 0   PHQ - 2 Score 0   Altered sleeping 1   Tired, decreased energy 1   Change in appetite 0   Feeling bad or failure about yourself  0   Trouble concentrating 0   Moving slowly or fidgety/restless 0   Suicidal thoughts 0   PHQ-9 Score 2   Difficult doing work/chores Not difficult at all      Psychosocial Evaluation and Intervention:   Psychosocial Re-Evaluation:   Vocational Rehabilitation: Provide vocational rehab assistance to qualifying candidates.   Vocational Rehab Evaluation & Intervention:     Vocational Rehab - 10/24/15 1732    Initial Vocational Rehab Evaluation & Intervention   Assessment shows need for Vocational Rehabilitation No      Education: Education Goals: Education classes will be provided on a weekly basis, covering required topics. Participant will state understanding/return demonstration of topics presented.  Learning Barriers/Preferences:     Learning Barriers/Preferences - 10/24/15 1732    Learning Barriers/Preferences   Learning Barriers Sight;Hearing  Pt. had stroke 7 years ago which affected his  ability to process and respond to questions. Responses are delayed at times.  Also pt has limited peripheral vision.  Patient does not  drive at night.     Learning Preferences Written Material      Education Topics: General Nutrition Guidelines/Fats and Fiber: -Group instruction provided by verbal, written material, models and posters to present the general guidelines for heart healthy nutrition. Gives an explanation and review of dietary fats and fiber.   Controlling Sodium/Reading Food Labels: -Group verbal and written material supporting the discussion of sodium use in heart healthy nutrition. Review and explanation with models, verbal and written materials for utilization of the food label.   Exercise Physiology & Risk Factors: - Group verbal and written instruction with models to review the exercise physiology of the cardiovascular system and associated critical values. Details cardiovascular disease risk factors and the goals associated with each risk factor.   Aerobic Exercise & Resistance Training: - Gives group verbal and written discussion on the health impact of inactivity. On the components of aerobic and resistive training programs and the benefits of this training and how to safely progress through these programs.   Flexibility, Balance, General Exercise Guidelines: - Provides group verbal and written instruction on the benefits of flexibility and balance training programs. Provides general exercise guidelines with specific guidelines to those with heart or lung disease. Demonstration and skill practice provided.   Stress Management: - Provides group verbal and written instruction about the health risks of elevated stress, cause of high stress, and healthy ways to reduce stress.   Depression: - Provides group verbal and written instruction on the correlation between heart/lung disease and depressed mood, treatment options, and the stigmas associated with seeking treatment.   Anatomy & Physiology of the Heart: -  Group verbal and written instruction and models provide basic cardiac anatomy and  physiology, with the coronary electrical and arterial systems. Review of: AMI, Angina, Valve disease, Heart Failure, Cardiac Arrhythmia, Pacemakers, and the ICD.   Cardiac Procedures: - Group verbal and written instruction and models to describe the testing methods done to diagnose heart disease. Reviews the outcomes of the test results. Describes the treatment choices: Medical Management, Angioplasty, or Coronary Bypass Surgery.   Cardiac Medications: - Group verbal and written instruction to review commonly prescribed medications for heart disease. Reviews the medication, class of the drug, and side effects. Includes the steps to properly store meds and maintain the prescription regimen.   Go Sex-Intimacy & Heart Disease, Get SMART - Goal Setting: - Group verbal and written instruction through game format to discuss heart disease and the return to sexual intimacy. Provides group verbal and written material to discuss and apply goal setting through the application of the S.M.A.R.T. Method.   Other Matters of the Heart: - Provides group verbal, written materials and models to describe Heart Failure, Angina, Valve Disease, and Diabetes in the realm of heart disease. Includes description of the disease process and treatment options available to the cardiac patient.   Exercise & Equipment Safety: - Individual verbal instruction and demonstration of equipment use and safety with use of the equipment.          Cardiac Rehab from 10/24/2015 in [facility] Cardiac Rehab   Date  10/24/15   Educator  DW   Instruction Review Code  1- partially meets, needs review/practice      Infection Prevention: - Provides verbal and written material to individual with discussion of infection control including proper hand washing and proper equipment cleaning during exercise session.      Cardiac Rehab from 10/24/2015 in [facility] Cardiac Rehab   Date  10/24/15   Educator  DW   Instruction Review Code  2- meets  goals/outcomes      Falls Prevention: - Provides verbal and written material to individual with discussion of falls prevention and safety.      Cardiac Rehab from 10/24/2015 in [facility] Cardiac Rehab   Date  10/24/15   Educator  DW   Instruction Review Code  2- meets goals/outcomes      Diabetes: - Individual verbal and written instruction to review signs/symptoms of diabetes, desired ranges of glucose level fasting, after meals and with exercise. Advice that pre and post exercise glucose checks will be done for 3 sessions at entry of program.      Cardiac Rehab from 10/24/2015 in [facility] Cardiac Rehab   Date  10/24/15   Educator  DW   Instruction Review Code  2- meets goals/outcomes       Knowledge Questionnaire Score:   Personal Goals and Risk Factors at Admission:     Personal Goals and Risk Factors at Admission - 10/24/15 1740    Personal Goals and Risk Factors on Admission    Weight Management Yes   Admit Weight 262 lb 4.8 oz (118.978 kg)   Goal Weight 200 lb (90.719 kg)   Increase Aerobic Exercise and Physical Activity Yes   Intervention While in program, learn and follow the exercise prescription taught. Start at a low level workload and increase workload after able to maintain previous level for 30 minutes. Increase time before increasing intensity.   Take Less Medication Yes   Intervention Learn your risk factors and begin the lifestyle modifications for risk factor control during your  time in the program.   Understand more about Heart/Pulmonary Disease. Yes   Intervention While in program utilize professionals for any questions, and attend the education sessions. Great websites to use are www.americanheart.org or www.lung.org for reliable information.   Diabetes Yes   Goal Blood glucose control identified by blood glucose values, HgbA1C. Participant verbalizes understanding of the signs/symptoms of hyper/hypo glycemia, proper foot care and importance of medication and  nutrition plan for blood glucose control.   Intervention Provide nutrition & aerobic exercise along with prescribed medications to achieve blood glucose in normal ranges: Fasting 65-99 mg/dL   Hypertension Yes   Goal Participant will see blood pressure controlled within the values of 140/5490mm/Hg or within value directed by their physician.   Intervention Provide nutrition & aerobic exercise along with prescribed medications to achieve BP 140/90 or less.   Lipids Yes   Goal Cholesterol controlled with medications as prescribed, with individualized exercise RX and with personalized nutrition plan. Value goals: LDL < 70mg , HDL > 40mg . Participant states understanding of desired cholesterol values and following prescriptions.   Intervention Provide nutrition & aerobic exercise along with prescribed medications to achieve LDL 70mg , HDL >40mg .   Stress Yes   Goal To meet with psychosocial counselor for stress and relaxation information and guidance. To state understanding of performing relaxation techniques and or identifying personal stressors.   Intervention Provide education on types of stress, identifiying stressors, and ways to cope with stress. Provide demonstration and active practice of relaxation techniques.      Personal Goals and Risk Factors Review:    Personal Goals Discharge (Final Personal Goals and Risk Factors Review):    ITP Comments:   Comments:  Today James [name] attended his med review/cardiac rehab orientation.  James [name] was accompanied by his wife James FreeLibby.  James [name] had a stroke 7 years ago which causes him to have a delay in processing or responding to questions. 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  Staff will need to give him time to respond and may need to repeat things due to hearing impairment.  Patient states he does better if he can look directly at the person talking.     Patient will start in the Tuesday/Thursday session on November 08, 2015 at 0830.

## 2015-10-24 NOTE — Addendum Note (Signed)
Addended by: Suszanne FinchWRIGHT, Yaqueline Gutter P on: 10/24/2015 06:15 PM   Modules accepted: Orders

## 2015-10-24 NOTE — Patient Instructions (Signed)
Patient Instructions  Patient Details  Name: James GeorgesKenneth Lacross MRN: [id number] Date of Birth: 10/12/1946 Referring Provider:  Marcina MillardParaschos, Alexander, MD  Below are the personal goals you chose as well as exercise and nutrition goals. Our goal is to help you keep on track towards obtaining and maintaining your goals. We will be discussing your progress on these goals with you throughout the program.  Initial Exercise Prescription:     Initial Exercise Prescription - 10/24/15 1400    Date of Initial Exercise Prescription   Date 10/24/15   Treadmill   MPH 2   Grade 0   Minutes 10   Bike   Level 0.2   Minutes 10   Recumbant Bike   Level 3   RPM 40   Watts 25   Minutes 15   NuStep   Level 3   Watts 30   Minutes 15   Arm Ergometer   Level 1   Watts 8   Minutes 10   Arm/Foot Ergometer   Level 4   Watts 15   Minutes 10   Cybex   Level 2   RPM 50   Minutes 10   Recumbant Elliptical   Level 1   RPM 40   Watts 10   Minutes 10   Elliptical   Level 1   Speed 3   Minutes 1   REL-XR   Level 2   Watts 30   Minutes 15   T5 Nustep   Level 2   Watts 15   Minutes 15   Biostep-RELP   Level 2   Watts 15   Minutes 15   Prescription Details   Frequency (times per week) 3   Duration Progress to 30 minutes of continuous aerobic without signs/symptoms of physical distress   Intensity   THRR REST +  30   Ratings of Perceived Exertion 11-15   Progression Continue progressive overload as per policy without signs/symptoms or physical distress.   Resistance Training   Training Prescription Yes   Weight 2   Reps 10-15      Exercise Goals: Frequency: Be able to perform aerobic exercise three times per week working toward 3-5 days per week.  Intensity: Work with a perceived exertion of 11 (fairly light) - 15 (hard) as tolerated. Follow your new exercise prescription and watch for changes in prescription as you progress with the program. Changes will be reviewed with you when they  are made.  Duration: You should be able to do 30 minutes of continuous aerobic exercise in addition to a 5 minute warm-up and a 5 minute cool-down routine.  Nutrition Goals: Your personal nutrition goals will be established when you do your nutrition analysis with the dietician.  The following are nutrition guidelines to follow: Cholesterol < 200mg /day Sodium < 1500mg /day Fiber: Men over 50 yrs - 30 grams per day  Personal Goals:     Personal Goals and Risk Factors at Admission - 10/24/15 1740    Personal Goals and Risk Factors on Admission    Weight Management Yes   Admit Weight 262 lb 4.8 oz (118.978 kg)   Goal Weight 200 lb (90.719 kg)   Increase Aerobic Exercise and Physical Activity Yes   Intervention While in program, learn and follow the exercise prescription taught. Start at a low level workload and increase workload after able to maintain previous level for 30 minutes. Increase time before increasing intensity.   Take Less Medication Yes   Intervention Learn your risk  factors and begin the lifestyle modifications for risk factor control during your time in the program.   Understand more about Heart/Pulmonary Disease. Yes   Intervention While in program utilize professionals for any questions, and attend the education sessions. Great websites to use are www.americanheart.org or www.lung.org for reliable information.   Diabetes Yes   Goal Blood glucose control identified by blood glucose values, HgbA1C. Participant verbalizes understanding of the signs/symptoms of hyper/hypo glycemia, proper foot care and importance of medication and nutrition plan for blood glucose control.   Intervention Provide nutrition & aerobic exercise along with prescribed medications to achieve blood glucose in normal ranges: Fasting 65-99 mg/dL   Hypertension Yes   Goal Participant will see blood pressure controlled within the values of 140/68mm/Hg or within value directed by their physician.    Intervention Provide nutrition & aerobic exercise along with prescribed medications to achieve BP 140/90 or less.   Lipids Yes   Goal Cholesterol controlled with medications as prescribed, with individualized exercise RX and with personalized nutrition plan. Value goals: LDL < , HDL > . Participant states understanding of desired cholesterol values and following prescriptions.   Intervention Provide nutrition & aerobic exercise along with prescribed medications to achieve LDL 70mg , HDL >40mg .   Stress Yes   Goal To meet with psychosocial counselor for stress and relaxation information and guidance. To state understanding of performing relaxation techniques and or identifying personal stressors.   Intervention Provide education on types of stress, identifiying stressors, and ways to cope with stress. Provide demonstration and active practice of relaxation techniques.      Tobacco Use Initial Evaluation: History  Smoking status  . Former Smoker -- 1.50 packs/day for 25 years  . Quit date: 11/17/1984  Smokeless tobacco  . Never Used    Comment: "years ago"    Copy of goals given to participant.

## 2015-10-31 ENCOUNTER — Ambulatory Visit
Admission: RE | Admit: 2015-10-31 | Discharge: 2015-10-31 | Disposition: A | Discharge status: Home / Self Care | Payer: Medicare Other | Source: Ambulatory Visit | Attending: Internal Medicine | Admitting: Internal Medicine

## 2015-10-31 DIAGNOSIS — K7689 Other specified diseases of liver: Secondary | ICD-10-CM | POA: Diagnosis present

## 2015-10-31 DIAGNOSIS — K769 Liver disease, unspecified: Secondary | ICD-10-CM

## 2015-10-31 MED ORDER — GADOBENATE DIMEGLUMINE 529 MG/ML IV SOLN
20.0000 mL | Freq: Once | INTRAVENOUS | Status: AC | PRN
  Administered 2015-10-31: 20 mL via INTRAVENOUS

## 2015-11-08 ENCOUNTER — Encounter: Payer: Medicare Other | Admitting: *Deleted

## 2015-11-08 DIAGNOSIS — Z9861 Coronary angioplasty status: Secondary | ICD-10-CM

## 2015-11-08 DIAGNOSIS — I213 ST elevation (STEMI) myocardial infarction of unspecified site: Secondary | ICD-10-CM | POA: Diagnosis not present

## 2015-11-08 LAB — GLUCOSE, CAPILLARY: Glucose-Capillary: 131 mg/dL — ABNORMAL HIGH (ref 65–99)

## 2015-11-08 NOTE — Progress Notes (Signed)
Daily Session Note  Patient Details  Name: James [name] MRN: [id number] Date of Birth: 09-23-1946 Referring Provider:  Isaias Cowman, MD  Encounter Date: 11/08/2015  Check In:     Session Check In - 11/08/15 0935    Check-In   Staff Present Hessie Knows, BS, Exercise Physiologist;Tyrice Hewitt Amedeo Plenty, BS, ACSM CEP, Exercise Physiologist;Carroll Enterkin, RN, BSN   ER physicians immediately available to respond to emergencies See telemetry face sheet for immediately available ER MD   Medication changes reported     No   Fall or balance concerns reported    No   Warm-up and Cool-down Performed on first and last piece of equipment   VAD Patient? No   Pain Assessment   Currently in Pain? No/denies   Multiple Pain Sites No         Goals Met:  Independence with exercise equipment Exercise tolerated well Personal goals reviewed No report of cardiac concerns or symptoms Strength training completed today  Goals Unmet:  Not Applicable  Goals Comments: Today was the patient's first day of class. The patient's initial exercise prescription (based on the 6 min walk evaluation) was reviewed with the patient.  Patient completed exercise prescription and all exercise goals during rehab session. The exercise was tolerated well and the patient is progressing in the program.     Dr. Emily Filbert is Medical Director for [facility].

## 2015-11-13 DIAGNOSIS — I213 ST elevation (STEMI) myocardial infarction of unspecified site: Secondary | ICD-10-CM

## 2015-11-13 DIAGNOSIS — Z9861 Coronary angioplasty status: Secondary | ICD-10-CM

## 2015-11-13 LAB — GLUCOSE, CAPILLARY
GLUCOSE-CAPILLARY: 204 mg/dL — AB (ref 65–99)
Glucose-Capillary: 135 mg/dL — ABNORMAL HIGH (ref 65–99)

## 2015-11-13 NOTE — Progress Notes (Signed)
Daily Session Note  Patient Details  Name: Addie Cederberg MRN: [id number] Date of Birth: 13-Feb-1946 Referring Provider:  Isaias Cowman, MD  Encounter Date: 11/13/2015  Check In:     Session Check In - 11/13/15 0929    Check-In   Staff Present Candiss Norse, MS, ACSM CEP, Exercise Physiologist;Diane Joya Gaskins, RN, Apolonio Schneiders, BS, Exercise Physiologist   ER physicians immediately available to respond to emergencies See telemetry face sheet for immediately available ER MD   Medication changes reported     No   Fall or balance concerns reported    No   Warm-up and Cool-down Performed on first and last piece of equipment   VAD Patient? No   Pain Assessment   Currently in Pain? No/denies         Goals Met:  Independence with exercise equipment Exercise tolerated well No report of cardiac concerns or symptoms Strength training completed today  Goals Unmet:  Not Applicable  Goals Comments:    Dr. Emily Filbert is Medical Director for [facility].

## 2015-11-15 DIAGNOSIS — I213 ST elevation (STEMI) myocardial infarction of unspecified site: Secondary | ICD-10-CM

## 2015-11-15 DIAGNOSIS — Z9861 Coronary angioplasty status: Secondary | ICD-10-CM

## 2015-11-15 LAB — GLUCOSE, CAPILLARY
GLUCOSE-CAPILLARY: 145 mg/dL — AB (ref 65–99)
Glucose-Capillary: 149 mg/dL — ABNORMAL HIGH (ref 65–99)

## 2015-11-15 NOTE — Progress Notes (Signed)
Daily Session Note  Patient Details  Name: James [name] MRN: [id number] Date of Birth: 04/16/1946 Referring Provider:  Isaias Cowman, MD  Encounter Date: 11/15/2015  Check In:     Session Check In - 11/15/15 0858    Check-In   Staff Present Gerlene Burdock, RN, BSN;Kendall Caprice Beaver, BS, Exercise Physiologist;Bharat Antillon, BS, ACSM EP-C, Exercise Physiologist   ER physicians immediately available to respond to emergencies See telemetry face sheet for immediately available ER MD   Medication changes reported     No   Fall or balance concerns reported    No   Warm-up and Cool-down Performed on first and last piece of equipment   VAD Patient? No   Pain Assessment   Currently in Pain? No/denies         Goals Met:  Proper associated with RPD/PD & O2 Sat Exercise tolerated well No report of cardiac concerns or symptoms Strength training completed today  Goals Unmet:  Not Applicable  Goals Comments:    Dr. Emily Filbert is Medical Director for [facility] and [facility].

## 2015-11-18 NOTE — Progress Notes (Signed)
Cardiac Individual Treatment Plan  Patient Details  Name: Karie GeorgesKenneth Shawler MRN: [id number] Date of Birth: 01/04/1946 Referring Provider:  Marcina MillardParaschos, Alexander, MD  Initial Encounter Date:    Visit Diagnosis: ST elevation myocardial infarction (STEMI), unspecified artery (HCC)  S/P PTCA (percutaneous transluminal coronary angioplasty)  Patient's Home Medications on Admission:  Current outpatient prescriptions:  .  aspirin 81 MG tablet, Take 81 mg by mouth daily., Disp: , Rfl:  .  atorvastatin (LIPITOR) 80 MG tablet, Take 1 tablet by mouth., Disp: , Rfl:  .  cyanocobalamin (,VITAMIN B-12,) 1000 MCG/ML injection, Inject into the muscle., Disp: , Rfl:  .  Cyanocobalamin (RA VITAMIN B-12 TR) 1000 MCG TBCR, Take by mouth daily. , Disp: , Rfl:  .  finasteride (PROSCAR) 5 MG tablet, Take by mouth., Disp: , Rfl:  .  metoprolol succinate (TOPROL-XL) 50 MG 24 hr tablet, Take by mouth., Disp: , Rfl:  .  ramipril (ALTACE) 10 MG capsule, Take 10 mg by mouth daily., Disp: , Rfl:  .  tamsulosin (FLOMAX) 0.4 MG CAPS capsule, Take 1 capsule (0.4 mg total) by mouth daily after supper., Disp: 90 capsule, Rfl: 3 .  ticagrelor (BRILINTA) 90 MG TABS tablet, Take by mouth., Disp: , Rfl:  .  Vitamin D, Cholecalciferol, 1000 UNITS CAPS, Take by mouth daily., Disp: , Rfl:   Past Medical History: Past Medical History  Diagnosis Date  . Stroke (HCC)   . Erectile dysfunction   . BPH (benign prostatic hyperplasia)   . Erectile dysfunction   . Hypogonadism in male     Tobacco Use: History  Smoking status  . Former Smoker -- 1.50 packs/day for 25 years  . Quit date: 11/17/1984  Smokeless tobacco  . Never Used    Comment: "years ago"    Labs: Recent Review Flowsheet Data    There is no flowsheet data to display.       Exercise Target Goals:    Exercise Program Goal: Individual exercise prescription set with THRR, safety & activity barriers. Participant demonstrates ability to understand and report  RPE using BORG scale, to self-measure pulse accurately, and to acknowledge the importance of the exercise prescription.  Exercise Prescription Goal: Starting with aerobic activity 30 plus minutes a day, 3 days per week for initial exercise prescription. Provide home exercise prescription and guidelines that participant acknowledges understanding prior to discharge.  Activity Barriers & Risk Stratification:     Activity Barriers & Risk Stratification - 10/24/15 1730    Activity Barriers & Risk Stratification   Activity Barriers None   Risk Stratification High      6 Minute Walk:     6 Minute Walk      10/24/15 1436       6 Minute Walk   Phase Initial     Distance 1300 feet     Walk Time 6 minutes     Resting HR 72 bpm     Resting BP 124/60 mmHg     Max Ex. HR 111 bpm     Max Ex. BP 144/62 mmHg     RPE 8     Symptoms No        Initial Exercise Prescription:     Initial Exercise Prescription - 10/24/15 1400    Date of Initial Exercise Prescription   Date 10/24/15   Treadmill   MPH 2   Grade 0   Minutes 10   Bike   Level 0.2   Minutes 10   Recumbant Bike  Level 3   RPM 40   Watts 25   Minutes 15   NuStep   Level 3   Watts 30   Minutes 15   Arm Ergometer   Level 1   Watts 8   Minutes 10   Arm/Foot Ergometer   Level 4   Watts 15   Minutes 10   Cybex   Level 2   RPM 50   Minutes 10   Recumbant Elliptical   Level 1   RPM 40   Watts 10   Minutes 10   Elliptical   Level 1   Speed 3   Minutes 1   REL-XR   Level 2   Watts 30   Minutes 15   T5 Nustep   Level 2   Watts 15   Minutes 15   Biostep-RELP   Level 2   Watts 15   Minutes 15   Prescription Details   Frequency (times per week) 3   Duration Progress to 30 minutes of continuous aerobic without signs/symptoms of physical distress   Intensity   THRR REST +  30   Ratings of Perceived Exertion 11-15   Progression Continue progressive overload as per policy without signs/symptoms or  physical distress.   Resistance Training   Training Prescription Yes   Weight 2   Reps 10-15      Exercise Prescription Changes:     Exercise Prescription Changes      11/15/15 1400           Response to Exercise   Blood Pressure (Admit) 128/78 mmHg       Blood Pressure (Exercise) 142/64 mmHg       Blood Pressure (Exit) 124/76 mmHg       Heart Rate (Admit) 99 bpm       Heart Rate (Exercise) 102 bpm       Heart Rate (Exit) 84 bpm       Rating of Perceived Exertion (Exercise) 12       Symptoms none       Duration Progress to 50 minutes of aerobic without signs/symptoms of physical distress       Intensity THRR unchanged       Progression Continue progressive overload as per policy without signs/symptoms or physical distress.       Resistance Training   Training Prescription Yes       Weight 3       Reps 10-15       Interval Training   Interval Training No       Treadmill   MPH 3.2       Grade 0       Minutes 20       T5 Nustep   Level 2       Watts 25       Minutes 10          Discharge Exercise Prescription (Final Exercise Prescription Changes):     Exercise Prescription Changes - 11/15/15 1400    Response to Exercise   Blood Pressure (Admit) 128/78 mmHg   Blood Pressure (Exercise) 142/64 mmHg   Blood Pressure (Exit) 124/76 mmHg   Heart Rate (Admit) 99 bpm   Heart Rate (Exercise) 102 bpm   Heart Rate (Exit) 84 bpm   Rating of Perceived Exertion (Exercise) 12   Symptoms none   Duration Progress to 50 minutes of aerobic without signs/symptoms of physical distress   Intensity THRR unchanged   Progression Continue  progressive overload as per policy without signs/symptoms or physical distress.   Resistance Training   Training Prescription Yes   Weight 3   Reps 10-15   Interval Training   Interval Training No   Treadmill   MPH 3.2   Grade 0   Minutes 20   T5 Nustep   Level 2   Watts 25   Minutes 10      Nutrition:  Target Goals: Understanding  of nutrition guidelines, daily intake of sodium 1500mg , cholesterol 200mg , calories 30% from fat and 7% or less from saturated fats, daily to have 5 or more servings of fruits and vegetables.  Biometrics:     Pre Biometrics - 10/24/15 1436    Pre Biometrics   Height 5' 10.5" (1.791 m)   Weight 262 lb 4.8 oz (118.978 kg)   Waist Circumference 47.75 inches   Hip Circumference 49 inches   Waist to Hip Ratio 0.97 %   BMI (Calculated) 37.2       Nutrition Therapy Plan and Nutrition Goals:   Nutrition Discharge: Rate Your Plate Scores:     Rate Your Plate - 64/33/29 0924    Rate Your Plate Scores   Pre Score 58   Pre Score % 64 %      Nutrition Goals Re-Evaluation:   Psychosocial: Target Goals: Acknowledge presence or absence of depression, maximize coping skills, provide positive support system. Participant is able to verbalize types and ability to use techniques and skills needed for reducing stress and depression.  Initial Review & Psychosocial Screening:     Initial Psych Review & Screening - 10/24/15 1741    Family Dynamics   Good Support System? Yes   Barriers   Psychosocial barriers to participate in program There are no identifiable barriers or psychosocial needs.   Screening Interventions   Interventions Encouraged to exercise  Pt. has a good support system.  He has his wife Almyra Free, 2 children and 3 grandchildren. He states he does not feel depressed.  He has an affiliation with a [employer].        Quality of Life Scores:     Quality of Life - 10/24/15 1743    Quality of Life Scores   Health/Function Pre 22.32 %   Socioeconomic Pre 30 %   Psych/Spiritual Pre 27.43 %   Family Pre 28.8 %   GLOBAL Pre 26.02 %      PHQ-9:     Recent Review Flowsheet Data    Depression screen [facility] 2/9 10/24/2015   Decreased Interest 0   Down, Depressed, Hopeless 0   PHQ - 2 Score 0   Altered sleeping 1   Tired, decreased energy 1   Change in appetite  0   Feeling bad or failure about yourself  0   Trouble concentrating 0   Moving slowly or fidgety/restless 0   Suicidal thoughts 0   PHQ-9 Score 2   Difficult doing work/chores Not difficult at all      Psychosocial Evaluation and Intervention:   Psychosocial Re-Evaluation:   Vocational Rehabilitation: Provide vocational rehab assistance to qualifying candidates.   Vocational Rehab Evaluation & Intervention:     Vocational Rehab - 10/24/15 1732    Initial Vocational Rehab Evaluation & Intervention   Assessment shows need for Vocational Rehabilitation No      Education: Education Goals: Education classes will be provided on a weekly basis, covering required topics. Participant will state understanding/return demonstration of topics presented.  Learning Barriers/Preferences:  Learning Barriers/Preferences - 10/24/15 1732    Learning Barriers/Preferences   Learning Barriers Sight;Hearing  Pt. had stroke 7 years ago which affected his  ability to process and respond to questions. Responses are delayed at times.  Also pt has limited peripheral vision.  Patient does not drive at night.     Learning Preferences Written Material      Education Topics: General Nutrition Guidelines/Fats and Fiber: -Group instruction provided by verbal, written material, models and posters to present the general guidelines for heart healthy nutrition. Gives an explanation and review of dietary fats and fiber.   Controlling Sodium/Reading Food Labels: -Group verbal and written material supporting the discussion of sodium use in heart healthy nutrition. Review and explanation with models, verbal and written materials for utilization of the food label.   Exercise Physiology & Risk Factors: - Group verbal and written instruction with models to review the exercise physiology of the cardiovascular system and associated critical values. Details cardiovascular disease risk factors and the goals  associated with each risk factor.   Aerobic Exercise & Resistance Training: - Gives group verbal and written discussion on the health impact of inactivity. On the components of aerobic and resistive training programs and the benefits of this training and how to safely progress through these programs.   Flexibility, Balance, General Exercise Guidelines: - Provides group verbal and written instruction on the benefits of flexibility and balance training programs. Provides general exercise guidelines with specific guidelines to those with heart or lung disease. Demonstration and skill practice provided.   Stress Management: - Provides group verbal and written instruction about the health risks of elevated stress, cause of high stress, and healthy ways to reduce stress.   Depression: - Provides group verbal and written instruction on the correlation between heart/lung disease and depressed mood, treatment options, and the stigmas associated with seeking treatment.   Anatomy & Physiology of the Heart: - Group verbal and written instruction and models provide basic cardiac anatomy and physiology, with the coronary electrical and arterial systems. Review of: AMI, Angina, Valve disease, Heart Failure, Cardiac Arrhythmia, Pacemakers, and the ICD.   Cardiac Procedures: - Group verbal and written instruction and models to describe the testing methods done to diagnose heart disease. Reviews the outcomes of the test results. Describes the treatment choices: Medical Management, Angioplasty, or Coronary Bypass Surgery.   Cardiac Medications: - Group verbal and written instruction to review commonly prescribed medications for heart disease. Reviews the medication, class of the drug, and side effects. Includes the steps to properly store meds and maintain the prescription regimen.          Cardiac Rehab from 11/15/2015 in [facility] Cardiac and [facility]monary Rehab   Date  11/15/15   Educator  CE   Instruction  Review Code  2- meets goals/outcomes      Go Sex-Intimacy & Heart Disease, Get SMART - Goal Setting: - Group verbal and written instruction through game format to discuss heart disease and the return to sexual intimacy. Provides group verbal and written material to discuss and apply goal setting through the application of the S.M.A.R.T. Method.   Other Matters of the Heart: - Provides group verbal, written materials and models to describe Heart Failure, Angina, Valve Disease, and Diabetes in the realm of heart disease. Includes description of the disease process and treatment options available to the cardiac patient.   Exercise & Equipment Safety: - Individual verbal instruction and demonstration of equipment use and safety with use of the  equipment.      Cardiac Rehab from 11/15/2015 in [facility] Cardiac and Pulmonary Rehab   Date  10/24/15   Educator  DW   Instruction Review Code  1- partially meets, needs review/practice      Infection Prevention: - Provides verbal and written material to individual with discussion of infection control including proper hand washing and proper equipment cleaning during exercise session.      Cardiac Rehab from 11/15/2015 in [facility] Cardiac and Pulmonary Rehab   Date  10/24/15   Educator  DW   Instruction Review Code  2- meets goals/outcomes      Falls Prevention: - Provides verbal and written material to individual with discussion of falls prevention and safety.      Cardiac Rehab from 11/15/2015 in [facility] Cardiac and Pulmonary Rehab   Date  10/24/15   Educator  DW   Instruction Review Code  2- meets goals/outcomes      Diabetes: - Individual verbal and written instruction to review signs/symptoms of diabetes, desired ranges of glucose level fasting, after meals and with exercise. Advice that pre and post exercise glucose checks will be done for 3 sessions at entry of program.      Cardiac Rehab from 11/15/2015 in [facility] Cardiac and Pulmonary Rehab    Date  10/24/15   Educator  DW   Instruction Review Code  2- meets goals/outcomes       Knowledge Questionnaire Score:     Knowledge Questionnaire Score - 11/08/15 1041    Knowledge Questionnaire Score   Pre Score 18      Personal Goals and Risk Factors at Admission:     Personal Goals and Risk Factors at Admission - 10/24/15 1740    Personal Goals and Risk Factors on Admission    Weight Management Yes   Admit Weight 262 lb 4.8 oz (118.978 kg)   Goal Weight 200 lb (90.719 kg)   Increase Aerobic Exercise and Physical Activity Yes   Intervention While in program, learn and follow the exercise prescription taught. Start at a low level workload and increase workload after able to maintain previous level for 30 minutes. Increase time before increasing intensity.   Take Less Medication Yes   Intervention Learn your risk factors and begin the lifestyle modifications for risk factor control during your time in the program.   Understand more about Heart/Pulmonary Disease. Yes   Intervention While in program utilize professionals for any questions, and attend the education sessions. Great websites to use are www.americanheart.org or www.lung.org for reliable information.   Diabetes Yes   Goal Blood glucose control identified by blood glucose values, HgbA1C. Participant verbalizes understanding of the signs/symptoms of hyper/hypo glycemia, proper foot care and importance of medication and nutrition plan for blood glucose control.   Intervention Provide nutrition & aerobic exercise along with prescribed medications to achieve blood glucose in normal ranges: Fasting 65-99 mg/dL   Hypertension Yes   Goal Participant will see blood pressure controlled within the values of 140/45mm/Hg or within value directed by their physician.   Intervention Provide nutrition & aerobic exercise along with prescribed medications to achieve BP 140/90 or less.   Lipids Yes   Goal Cholesterol controlled with  medications as prescribed, with individualized exercise RX and with personalized nutrition plan. Value goals: LDL < 70mg , HDL > 40mg . Participant states understanding of desired cholesterol values and following prescriptions.   Intervention Provide nutrition & aerobic exercise along with prescribed medications to achieve LDL 70mg , HDL >40mg .  Stress Yes   Goal To meet with psychosocial counselor for stress and relaxation information and guidance. To state understanding of performing relaxation techniques and or identifying personal stressors.   Intervention Provide education on types of stress, identifiying stressors, and ways to cope with stress. Provide demonstration and active practice of relaxation techniques.      Personal Goals and Risk Factors Review:      Goals and Risk Factor Review      11/15/15 0941           Increase Aerobic Exercise and Physical Activity   Goals Progress/Improvement seen  Yes       Comments Rocky Link is relatively new to class, but is enjoying getting into the exercise portion of the program, and feels better each time he has completed exercise.          Personal Goals Discharge (Final Personal Goals and Risk Factors Review):      Goals and Risk Factor Review - 11/15/15 0941    Increase Aerobic Exercise and Physical Activity   Goals Progress/Improvement seen  Yes   Comments Rocky Link is relatively new to class, but is enjoying getting into the exercise portion of the program, and feels better each time he has completed exercise.      ITP Comments:     ITP Comments      11/18/15 1302           ITP Comments Ready for 30 day review.  Continue with ITP   New start to program, has attended three sessions.          Comments:

## 2015-11-20 ENCOUNTER — Encounter: Payer: Medicare Other | Attending: Cardiology | Admitting: *Deleted

## 2015-11-20 DIAGNOSIS — I213 ST elevation (STEMI) myocardial infarction of unspecified site: Secondary | ICD-10-CM | POA: Diagnosis present

## 2015-11-20 DIAGNOSIS — Z9861 Coronary angioplasty status: Secondary | ICD-10-CM | POA: Diagnosis present

## 2015-11-20 NOTE — Progress Notes (Signed)
Daily Session Note  Patient Details  Name: James [name] MRN: [id number] Date of Birth: [birth year] Referring Provider:  Isaias Cowman, MD  Encounter Date: 11/20/2015  Check In:     Session Check In - 11/20/15 1003    Check-In   Staff Present Roanna Epley, RN, Drusilla Kanner, MS, ACSM CEP, Exercise Physiologist;Kendall Otis Peak, Exercise Physiologist   ER physicians immediately available to respond to emergencies See telemetry face sheet for immediately available ER MD   Medication changes reported     No   Fall or balance concerns reported    No   Warm-up and Cool-down Performed on first and last piece of equipment   VAD Patient? No   Pain Assessment   Currently in Pain? No/denies   Multiple Pain Sites No         Goals Met:  Independence with exercise equipment Exercise tolerated well No report of cardiac concerns or symptoms Strength training completed today  Goals Unmet:  Not Applicable  Goals Comments: Patient completed exercise prescription and all exercise goals during rehab session. The exercise was tolerated well and the patient is progressing in the program.    Dr. Emily Filbert is Medical Director for [place] and [facility].

## 2015-11-21 NOTE — Addendum Note (Signed)
Addended by: Rudy JewBICE, SUSANNE P on: 11/21/2015 11:25 AM   Modules accepted: Orders

## 2015-11-22 DIAGNOSIS — I213 ST elevation (STEMI) myocardial infarction of unspecified site: Secondary | ICD-10-CM | POA: Diagnosis not present

## 2015-11-22 DIAGNOSIS — Z9861 Coronary angioplasty status: Secondary | ICD-10-CM

## 2015-11-22 NOTE — Progress Notes (Signed)
Daily Session Note  Patient Details  Name: James [name] MRN: [id number] Date of Birth: 30-Jul-1946 Referring Provider:  Isaias Cowman, MD  Encounter Date: 11/22/2015  Check In:     Session Check In - 11/22/15 0903    Check-In   Staff Present Hessie Knows, BS, Exercise Physiologist;Steven Way, BS, ACSM EP-C, Exercise Physiologist;Carroll Enterkin, RN, BSN   ER physicians immediately available to respond to emergencies See telemetry face sheet for immediately available ER MD   Medication changes reported     No   Fall or balance concerns reported    No   Warm-up and Cool-down Performed on first and last piece of equipment   VAD Patient? No   Pain Assessment   Currently in Pain? No/denies         Goals Met:  Independence with exercise equipment Exercise tolerated well No report of cardiac concerns or symptoms Strength training completed today  Goals Unmet:  Not Applicable  Goals Comments:    Dr. Emily Filbert is Medical Director for [place] and [facility].

## 2015-11-27 DIAGNOSIS — I213 ST elevation (STEMI) myocardial infarction of unspecified site: Secondary | ICD-10-CM | POA: Diagnosis not present

## 2015-11-27 DIAGNOSIS — Z9861 Coronary angioplasty status: Secondary | ICD-10-CM

## 2015-11-27 NOTE — Progress Notes (Signed)
Daily Session Note  Patient Details  Name: James [name] MRN: [id number] Date of Birth: 12/24/1945 Referring Provider:  Paraschos, Alexander, MD  Encounter Date: 11/27/2015  Check In:     Session Check In - 11/27/15 1103    Check-In   Staff Present Susanne Bice, RN, BSN, CCRP;Renee MacMillan, MS, ACSM CEP, Exercise Physiologist; , BS, ACSM EP-C, Exercise Physiologist   ER physicians immediately available to respond to emergencies See telemetry face sheet for immediately available ER MD   Medication changes reported     No   Fall or balance concerns reported    No   Warm-up and Cool-down Performed on first and last piece of equipment   VAD Patient? No   Pain Assessment   Currently in Pain? No/denies         Goals Met:  Proper associated with RPD/PD & O2 Sat Exercise tolerated well No report of cardiac concerns or symptoms Strength training completed today  Goals Unmet:  Not Applicable  Goals Comments:   Dr. Mark Miller is Medical Director for [facility] and [facility]. 

## 2015-11-29 DIAGNOSIS — I213 ST elevation (STEMI) myocardial infarction of unspecified site: Secondary | ICD-10-CM

## 2015-11-29 DIAGNOSIS — Z9861 Coronary angioplasty status: Secondary | ICD-10-CM

## 2015-11-29 NOTE — Progress Notes (Signed)
Daily Session Note  Patient Details  Name: James [name] MRN: [id number] Date of Birth: 09-Jul-1946 Referring Provider:  Isaias Cowman, MD  Encounter Date: 11/29/2015  Check In:     Session Check In - 11/29/15 0854    Check-In   Staff Present Hessie Knows, BS, Exercise Physiologist;Carroll Enterkin, RN, BSN;Steven Way, BS, ACSM EP-C, Exercise Physiologist   ER physicians immediately available to respond to emergencies See telemetry face sheet for immediately available ER MD   Medication changes reported     No   Fall or balance concerns reported    No   Warm-up and Cool-down Performed on first and last piece of equipment   VAD Patient? No   Pain Assessment   Currently in Pain? No/denies         Goals Met:  Independence with exercise equipment Exercise tolerated well No report of cardiac concerns or symptoms Strength training completed today  Goals Unmet:  Not Applicable  Goals Comments:    Dr. Emily Filbert is Medical Director for [place] and [facility].

## 2015-12-04 DIAGNOSIS — I213 ST elevation (STEMI) myocardial infarction of unspecified site: Secondary | ICD-10-CM

## 2015-12-04 DIAGNOSIS — Z9861 Coronary angioplasty status: Secondary | ICD-10-CM

## 2015-12-04 NOTE — Progress Notes (Signed)
Daily Session Note  Patient Details  Name: James [name] MRN: [id number] Date of Birth: [birth year] Referring Provider:  Ezequiel Kayser, MD  Encounter Date: 12/04/2015  Check In:     Session Check In - 12/04/15 2947    Check-In   Staff Present Candiss Norse, MS, ACSM CEP, Exercise Physiologist;Diane Joya Gaskins, RN, Apolonio Schneiders, BS, Exercise Physiologist   ER physicians immediately available to respond to emergencies See telemetry face sheet for immediately available ER MD   Medication changes reported     No   Fall or balance concerns reported    No   Warm-up and Cool-down Performed on first and last piece of equipment   VAD Patient? No   Pain Assessment   Currently in Pain? No/denies         Goals Met:  Independence with exercise equipment Exercise tolerated well No report of cardiac concerns or symptoms Strength training completed today  Goals Unmet:  Not Applicable  Goals Comments:    Dr. Emily Filbert is Medical Director for [place] and [facility].

## 2015-12-05 ENCOUNTER — Telehealth: Payer: Self-pay

## 2015-12-05 DIAGNOSIS — N529 Male erectile dysfunction, unspecified: Secondary | ICD-10-CM

## 2015-12-05 MED ORDER — SILDENAFIL CITRATE 20 MG PO TABS: 20.0000 mg | ORAL_TABLET | Freq: Three times a day (TID) | ORAL | Status: DC

## 2015-12-05 NOTE — Telephone Encounter (Signed)
Pt called requesting refills of sildenafil. Pt was seen in 06/2015 and Carollee Herter told pt to f/u in 1 year.

## 2015-12-06 ENCOUNTER — Other Ambulatory Visit: Payer: Self-pay

## 2015-12-06 DIAGNOSIS — Z9861 Coronary angioplasty status: Secondary | ICD-10-CM

## 2015-12-06 DIAGNOSIS — I213 ST elevation (STEMI) myocardial infarction of unspecified site: Secondary | ICD-10-CM | POA: Diagnosis not present

## 2015-12-06 DIAGNOSIS — N529 Male erectile dysfunction, unspecified: Secondary | ICD-10-CM

## 2015-12-06 MED ORDER — SILDENAFIL CITRATE 20 MG PO TABS: 20.0000 mg | ORAL_TABLET | Freq: Three times a day (TID) | ORAL | Status: DC

## 2015-12-06 NOTE — Progress Notes (Signed)
Daily Session Note  Patient Details  Name: Edi Gorniak MRN: [id number] Date of Birth: 04-16-1946 Referring Provider:  Isaias Cowman, MD  Encounter Date: 12/06/2015  Check In:     Session Check In - 12/06/15 0855    Check-In   Staff Present Hessie Knows, BS, Exercise Physiologist;Carroll Enterkin, RN, BSN;Steven Way, BS, ACSM EP-C, Exercise Physiologist   ER physicians immediately available to respond to emergencies See telemetry face sheet for immediately available ER MD   Medication changes reported     No   Fall or balance concerns reported    No   Warm-up and Cool-down Performed on first and last piece of equipment   VAD Patient? No   Pain Assessment   Currently in Pain? No/denies         Goals Met:  Independence with exercise equipment Exercise tolerated well No report of cardiac concerns or symptoms Strength training completed today  Goals Unmet:  Not Applicable  Goals Comments:    Dr. Emily Filbert is Medical Director for [facility].

## 2015-12-11 DIAGNOSIS — I213 ST elevation (STEMI) myocardial infarction of unspecified site: Secondary | ICD-10-CM

## 2015-12-11 DIAGNOSIS — Z9861 Coronary angioplasty status: Secondary | ICD-10-CM

## 2015-12-11 NOTE — Progress Notes (Signed)
Daily Session Note  Patient Details  Name: James [name] MRN: [id number] Date of Birth: January 22, 1946 Referring Provider:  Isaias Cowman, MD  Encounter Date: 12/11/2015  Check In:     Session Check In - 12/11/15 0930    Check-In   Staff Present Candiss Norse, MS, ACSM CEP, Exercise Physiologist;Julisa Flippo Caprice Beaver, BS, Exercise Physiologist;Diane Joya Gaskins, RN, BSN   ER physicians immediately available to respond to emergencies See telemetry face sheet for immediately available ER MD   Medication changes reported     No   Fall or balance concerns reported    No   Warm-up and Cool-down Performed on first and last piece of equipment   VAD Patient? No   Pain Assessment   Currently in Pain? No/denies         Goals Met:  Independence with exercise equipment Exercise tolerated well No report of cardiac concerns or symptoms Strength training completed today  Goals Unmet:  Not Applicable  Goals Comments:    Dr. Emily Filbert is Medical Director for [facility].

## 2015-12-13 DIAGNOSIS — Z9861 Coronary angioplasty status: Secondary | ICD-10-CM

## 2015-12-13 DIAGNOSIS — I213 ST elevation (STEMI) myocardial infarction of unspecified site: Secondary | ICD-10-CM

## 2015-12-13 NOTE — Progress Notes (Signed)
Cardiac Individual Treatment Plan  Patient Details  Name: James [name] MRN: [id number] Date of Birth: November 18, 1945 Referring Provider:  Isaias Cowman, MD  Initial Encounter Date:    Visit Diagnosis: S/P PTCA (percutaneous transluminal coronary angioplasty)  ST elevation myocardial infarction (STEMI), unspecified artery ([place])  Patient's Home Medications on Admission:  Current outpatient prescriptions:  .  aspirin 81 MG tablet, Take 81 mg by mouth daily., Disp: , Rfl:  .  atorvastatin (LIPITOR) 80 MG tablet, Take 1 tablet by mouth., Disp: , Rfl:  .  cyanocobalamin (,VITAMIN B-12,) 1000 MCG/ML injection, Inject into the muscle., Disp: , Rfl:  .  Cyanocobalamin (RA VITAMIN B-12 TR) 1000 MCG TBCR, Take by mouth daily. , Disp: , Rfl:  .  finasteride (PROSCAR) 5 MG tablet, Take by mouth., Disp: , Rfl:  .  metoprolol succinate (TOPROL-XL) 50 MG 24 hr tablet, Take by mouth., Disp: , Rfl:  .  ramipril (ALTACE) 10 MG capsule, Take 10 mg by mouth daily., Disp: , Rfl:  .  sildenafil (REVATIO) 20 MG tablet, Take 1 tablet (20 mg total) by mouth 3 (three) times daily., Disp: 90 tablet, Rfl: 0 .  tamsulosin (FLOMAX) 0.4 MG CAPS capsule, Take 1 capsule (0.4 mg total) by mouth daily after supper., Disp: 90 capsule, Rfl: 3 .  ticagrelor (BRILINTA) 90 MG TABS tablet, Take by mouth., Disp: , Rfl:  .  Vitamin D, Cholecalciferol, 1000 UNITS CAPS, Take by mouth daily., Disp: , Rfl:   Past Medical History: Past Medical History  Diagnosis Date  . Stroke ([place])   . Erectile dysfunction   . BPH (benign prostatic hyperplasia)   . Erectile dysfunction   . Hypogonadism in male     Tobacco Use: History  Smoking status  . Former Smoker -- 1.50 packs/day for 25 years  . Quit date: 11/17/1984  Smokeless tobacco  . Never Used    Comment: "years ago"    Labs: Recent Review Flowsheet Data    There is no flowsheet data to display.       Exercise Target Goals:    Exercise Program Goal: Individual  exercise prescription set with THRR, safety & activity barriers. Participant demonstrates ability to understand and report RPE using BORG scale, to self-measure pulse accurately, and to acknowledge the importance of the exercise prescription.  Exercise Prescription Goal: Starting with aerobic activity 30 plus minutes a day, 3 days per week for initial exercise prescription. Provide home exercise prescription and guidelines that participant acknowledges understanding prior to discharge.  Activity Barriers & Risk Stratification:     Activity Barriers & Risk Stratification - 10/24/15 1730    Activity Barriers & Risk Stratification   Activity Barriers None   Risk Stratification High      6 Minute Walk:     6 Minute Walk      10/24/15 1436       6 Minute Walk   Phase Initial     Distance 1300 feet     Walk Time 6 minutes     Resting HR 72 bpm     Resting BP 124/60 mmHg     Max Ex. HR 111 bpm     Max Ex. BP 144/62 mmHg     RPE 8     Symptoms No        Initial Exercise Prescription:     Initial Exercise Prescription - 10/24/15 1400    Date of Initial Exercise Prescription   Date 10/24/15   Treadmill   MPH 2  Grade 0   Minutes 10   Bike   Level 0.2   Minutes 10   Recumbant Bike   Level 3   RPM 40   Watts 25   Minutes 15   NuStep   Level 3   Watts 30   Minutes 15   Arm Ergometer   Level 1   Watts 8   Minutes 10   Arm/Foot Ergometer   Level 4   Watts 15   Minutes 10   Cybex   Level 2   RPM 50   Minutes 10   Recumbant Elliptical   Level 1   RPM 40   Watts 10   Minutes 10   Elliptical   Level 1   Speed 3   Minutes 1   REL-XR   Level 2   Watts 30   Minutes 15   T5 Nustep   Level 2   Watts 15   Minutes 15   Biostep-RELP   Level 2   Watts 15   Minutes 15   Prescription Details   Frequency (times per week) 3   Duration Progress to 30 minutes of continuous aerobic without signs/symptoms of physical distress   Intensity   THRR REST +  30    Ratings of Perceived Exertion 11-15   Progression Continue progressive overload as per policy without signs/symptoms or physical distress.   Resistance Training   Training Prescription Yes   Weight 2   Reps 10-15      Exercise Prescription Changes:     Exercise Prescription Changes      11/15/15 1400 12/04/15 0656         Response to Exercise   Blood Pressure (Admit) 128/78 mmHg 124/70 mmHg      Blood Pressure (Exercise) 142/64 mmHg 162/64 mmHg      Blood Pressure (Exit) 124/76 mmHg 100/64 mmHg      Heart Rate (Admit) 99 bpm 70 bpm      Heart Rate (Exercise) 102 bpm 97 bpm      Heart Rate (Exit) 84 bpm 87 bpm      Rating of Perceived Exertion (Exercise) 12 13      Symptoms none none      Duration Progress to 50 minutes of aerobic without signs/symptoms of physical distress Progress to 50 minutes of aerobic without signs/symptoms of physical distress      Intensity THRR unchanged THRR unchanged      Progression Continue progressive overload as per policy without signs/symptoms or physical distress. Continue progressive overload as per policy without signs/symptoms or physical distress.      Resistance Training   Training Prescription Yes Yes      Weight 3 3      Reps 10-15 10-15      Interval Training   Interval Training No No      Treadmill   MPH 3.2 3.2      Grade 0 0      Minutes 20 20      T5 Nustep   Level 2 2      Watts 25 25      Minutes 10 10         Discharge Exercise Prescription (Final Exercise Prescription Changes):     Exercise Prescription Changes - 12/04/15 0656    Response to Exercise   Blood Pressure (Admit) 124/70 mmHg   Blood Pressure (Exercise) 162/64 mmHg   Blood Pressure (Exit) 100/64 mmHg   Heart Rate (Admit) 70  bpm   Heart Rate (Exercise) 97 bpm   Heart Rate (Exit) 87 bpm   Rating of Perceived Exertion (Exercise) 13   Symptoms none   Duration Progress to 50 minutes of aerobic without signs/symptoms of physical distress   Intensity  THRR unchanged   Progression Continue progressive overload as per policy without signs/symptoms or physical distress.   Resistance Training   Training Prescription Yes   Weight 3   Reps 10-15   Interval Training   Interval Training No   Treadmill   MPH 3.2   Grade 0   Minutes 20   T5 Nustep   Level 2   Watts 25   Minutes 10      Nutrition:  Target Goals: Understanding of nutrition guidelines, daily intake of sodium <154m, cholesterol <2041m calories 30% from fat and 7% or less from saturated fats, daily to have 5 or more servings of fruits and vegetables.  Biometrics:     Pre Biometrics - 10/24/15 1436    Pre Biometrics   Height 5' 10.5" (1.791 m)   Weight 262 lb 4.8 oz (118.978 kg)   Waist Circumference 47.75 inches   Hip Circumference 49 inches   Waist to Hip Ratio 0.97 %   BMI (Calculated) 37.2       Nutrition Therapy Plan and Nutrition Goals:     Nutrition Therapy & Goals - 12/04/15 1237    Nutrition Therapy   Diet Instructed patient on a meal plan based on 1800 calories including DASH diet principles   Drug/Food Interactions Statins/Certain Fruits   Fiber 30 grams   Whole Grain Foods 3 servings   Protein 8 ounces/day   Saturated Fats 12 max. grams   Fruits and Vegetables 5 servings/day   Personal Nutrition Goals   Personal Goal #1 Try Starkist "very low sodium" tuna   Personal Goal #2 Increase intake of whole grains. Try 100% whole wheat pasta and brown rice   Personal Goal #3 Increase fruits and vegetables with goal of 5 servings per day.   Personal Goal #4 Try the mini bags of popcorn verses the large bags   Additional Goals? Yes   Personal Goal #5 Consider a margarine with no trans fat. Refer to list.      Nutrition Discharge: Rate Your Plate Scores:     Rate Your Plate - 1262/83/6692947  Rate Your Plate Scores   Pre Score 58   Pre Score % 64 %      Nutrition Goals Re-Evaluation:     Nutrition Goals Re-Evaluation      12/13/15 1149            Personal Goal #3 Re-Evaluation   Personal Goal #3 KeYvone Neuaid he will try to cut back on the bread that he likes and substitute some vegetables. KeYvone Neuaid it was very helpful when he met with the Cardiac Rehab Registered dietician       Goal Progress Seen Yes          Psychosocial: Target Goals: Acknowledge presence or absence of depression, maximize coping skills, provide positive support system. Participant is able to verbalize types and ability to use techniques and skills needed for reducing stress and depression.  Initial Review & Psychosocial Screening:     Initial Psych Review & Screening - 10/24/15 [place]   Barriers   Psychosocial barriers to participate in program There are no identifiable barriers or psychosocial  needs.   Screening Interventions   Interventions Encouraged to exercise  Pt. has a good support system.  He has his wife Golden Circle, 2 children and 3 grandchildren. He states he does not feel depressed.  He has an affiliation with a [employer].        Quality of Life Scores:     Quality of Life - 10/24/15 1743    Quality of Life Scores   Health/Function Pre 22.32 %   Socioeconomic Pre 30 %   Psych/Spiritual Pre 27.43 %   Family Pre 28.8 %   GLOBAL Pre 26.02 %      PHQ-9:     Recent Review Flowsheet Data    Depression screen [facility] 2/9 10/24/2015   Decreased Interest 0   Down, Depressed, Hopeless 0   PHQ - 2 Score 0   Altered sleeping 1   Tired, decreased energy 1   Change in appetite 0   Feeling bad or failure about yourself  0   Trouble concentrating 0   Moving slowly or fidgety/restless 0   Suicidal thoughts 0   PHQ-9 Score 2   Difficult doing work/chores Not difficult at all      Psychosocial Evaluation and Intervention:     Psychosocial Evaluation - 12/11/15 0951    Psychosocial Evaluation & Interventions   Interventions Encouraged to exercise with the program and follow  exercise prescription;Stress management education;Relaxation education   Comments Counselor met with Mr. Gallina today for initial psychosocial evaluation.  He will be 70 years old next week and continues to work in [employer].  He had a heart attack on 11/3.  He has a strong support system with a spouse of 78 years and adult children who live close by as well as active involvement in his local church community.  Mr. Vandevoorde states he has several other health issues including sleep apnea and erectile dysfunction for which he is being treated.  He denies a history of depression or anxiety or current symptoms and reports he is typically in a positive mood.  He does mention how stressful at times his job is as well as concern over his health issues.   His goals for this program are to lose weight, increase his stamina and have a stronger heart.  he would also like to move around with greater ease or worry of having another heart attack.  Mr. Cocuzza works out at [employer] and plans to return there following this program.     Continued Psychosocial Services Needed Yes  Mr. Schirtzinger will benefit form the stress management and relaxations components of this program.  He will also benefit form meeting with the dietician to address his weight loss goals.        Psychosocial Re-Evaluation:     Psychosocial Re-Evaluation      12/13/15 1154           Psychosocial Re-Evaluation   Interventions Encouraged to attend Cardiac Rehabilitation for the exercise       Comments Yvone Neu said he really wants to lose weight and is trying to exercise more. He realizes that he is only at session 8 or 10 and has 36 sessions of [facility] available to him.           Vocational Rehabilitation: Provide vocational rehab assistance to qualifying candidates.   Vocational Rehab Evaluation & Intervention:     Vocational Rehab - 10/24/15 1732    Initial Vocational Rehab Evaluation & Intervention  Assessment shows need for  Vocational Rehabilitation No      Education: Education Goals: Education classes will be provided on a weekly basis, covering required topics. Participant will state understanding/return demonstration of topics presented.  Learning Barriers/Preferences:     Learning Barriers/Preferences - 10/24/15 1732    Learning Barriers/Preferences   Learning Barriers Sight;Hearing  Pt. had stroke 7 years ago which affected his  ability to process and respond to questions. Responses are delayed at times.  Also pt has limited peripheral vision.  Patient does not drive at night.     Learning Preferences Written Material      Education Topics: General Nutrition Guidelines/Fats and Fiber: -Group instruction provided by verbal, written material, models and posters to present the general guidelines for heart healthy nutrition. Gives an explanation and review of dietary fats and fiber.   Controlling Sodium/Reading Food Labels: -Group verbal and written material supporting the discussion of sodium use in heart healthy nutrition. Review and explanation with models, verbal and written materials for utilization of the food label.   Exercise Physiology & Risk Factors: - Group verbal and written instruction with models to review the exercise physiology of the cardiovascular system and associated critical values. Details cardiovascular disease risk factors and the goals associated with each risk factor.          Cardiac Rehab from 12/13/2015 in [facility] Cardiac and Pulmonary Rehab   Date  11/22/15   Educator  SW   Instruction Review Code  2- meets goals/outcomes      Aerobic Exercise & Resistance Training: - Gives group verbal and written discussion on the health impact of inactivity. On the components of aerobic and resistive training programs and the benefits of this training and how to safely progress through these programs.      Cardiac Rehab from 12/13/2015 in [facility] Cardiac and Pulmonary Rehab   Date   12/04/15   Educator  RM   Instruction Review Code  2- meets goals/outcomes      Flexibility, Balance, General Exercise Guidelines: - Provides group verbal and written instruction on the benefits of flexibility and balance training programs. Provides general exercise guidelines with specific guidelines to those with heart or lung disease. Demonstration and skill practice provided.      Cardiac Rehab from 12/13/2015 in [facility] Cardiac and Pulmonary Rehab   Date  12/04/15   Educator  RM   Instruction Review Code  2- meets goals/outcomes      Stress Management: - Provides group verbal and written instruction about the health risks of elevated stress, cause of high stress, and healthy ways to reduce stress.      Cardiac Rehab from 12/13/2015 in [facility] Cardiac and Pulmonary Rehab   Date  11/29/15   Educator  CE   Instruction Review Code  2- meets goals/outcomes      Depression: - Provides group verbal and written instruction on the correlation between heart/lung disease and depressed mood, treatment options, and the stigmas associated with seeking treatment.   Anatomy & Physiology of the Heart: - Group verbal and written instruction and models provide basic cardiac anatomy and physiology, with the coronary electrical and arterial systems. Review of: AMI, Angina, Valve disease, Heart Failure, Cardiac Arrhythmia, Pacemakers, and the ICD.      Cardiac Rehab from 12/13/2015 in [facility] Cardiac and Pulmonary Rehab   Date  12/06/15   Educator  CE   Instruction Review Code  2- meets goals/outcomes      Cardiac Procedures: -  Group verbal and written instruction and models to describe the testing methods done to diagnose heart disease. Reviews the outcomes of the test results. Describes the treatment choices: Medical Management, Angioplasty, or Coronary Bypass Surgery.   Cardiac Medications: - Group verbal and written instruction to review commonly prescribed medications for heart disease. Reviews the  medication, class of the drug, and side effects. Includes the steps to properly store meds and maintain the prescription regimen.      Cardiac Rehab from 12/13/2015 in [facility] Cardiac and Pulmonary Rehab   Date  11/15/15   Educator  CE   Instruction Review Code  2- meets goals/outcomes      Go Sex-Intimacy & Heart Disease, Get SMART - Goal Setting: - Group verbal and written instruction through game format to discuss heart disease and the return to sexual intimacy. Provides group verbal and written material to discuss and apply goal setting through the application of the S.M.A.R.T. Method.   Other Matters of the Heart: - Provides group verbal, written materials and models to describe Heart Failure, Angina, Valve Disease, and Diabetes in the realm of heart disease. Includes description of the disease process and treatment options available to the cardiac patient.   Exercise & Equipment Safety: - Individual verbal instruction and demonstration of equipment use and safety with use of the equipment.      Cardiac Rehab from 12/13/2015 in [facility] Cardiac and Pulmonary Rehab   Date  10/24/15   Educator  DW   Instruction Review Code  1- partially meets, needs review/practice      Infection Prevention: - Provides verbal and written material to individual with discussion of infection control including proper hand washing and proper equipment cleaning during exercise session.      Cardiac Rehab from 12/13/2015 in [facility] Cardiac and Pulmonary Rehab   Date  10/24/15   Educator  DW   Instruction Review Code  2- meets goals/outcomes      Falls Prevention: - Provides verbal and written material to individual with discussion of falls prevention and safety.      Cardiac Rehab from 12/13/2015 in [facility] Cardiac and Pulmonary Rehab   Date  10/24/15   Educator  DW   Instruction Review Code  2- meets goals/outcomes      Diabetes: - Individual verbal and written instruction to review signs/symptoms of  diabetes, desired ranges of glucose level fasting, after meals and with exercise. Advice that pre and post exercise glucose checks will be done for 3 sessions at entry of program.      Cardiac Rehab from 12/13/2015 in [facility] Cardiac and Pulmonary Rehab   Date  12/13/15   Educator  [place]   Instruction Review Code  2- meets goals/outcomes       Knowledge Questionnaire Score:     Knowledge Questionnaire Score - 11/08/15 1041    Knowledge Questionnaire Score   Pre Score 18      Personal Goals and Risk Factors at Admission:     Personal Goals and Risk Factors at Admission - 10/24/15 1740    Personal Goals and Risk Factors on Admission    Weight Management Yes   Admit Weight 262 lb 4.8 oz (118.978 kg)   Goal Weight 200 lb (90.719 kg)   Increase Aerobic Exercise and Physical Activity Yes   Intervention While in program, learn and follow the exercise prescription taught. Start at a low level workload and increase workload after able to maintain previous level for 30 minutes. Increase time before  increasing intensity.   Take Less Medication Yes   Intervention Learn your risk factors and begin the lifestyle modifications for risk factor control during your time in the program.   Understand more about Heart/Pulmonary Disease. Yes   Intervention While in program utilize professionals for any questions, and attend the education sessions. Great websites to use are www.americanheart.org or www.lung.org for reliable information.   Diabetes Yes   Goal Blood glucose control identified by blood glucose values, HgbA1C. Participant verbalizes understanding of the signs/symptoms of hyper/hypo glycemia, proper foot care and importance of medication and nutrition plan for blood glucose control.   Intervention Provide nutrition & aerobic exercise along with prescribed medications to achieve blood glucose in normal ranges: Fasting 65-99 mg/dL   Hypertension Yes   Goal Participant will see blood pressure  controlled within the values of 140/10m/Hg or within value directed by their physician.   Intervention Provide nutrition & aerobic exercise along with prescribed medications to achieve BP 140/90 or less.   Lipids Yes   Goal Cholesterol controlled with medications as prescribed, with individualized exercise RX and with personalized nutrition plan. Value goals: LDL < 781m HDL > 4054mParticipant states understanding of desired cholesterol values and following prescriptions.   Intervention Provide nutrition & aerobic exercise along with prescribed medications to achieve LDL <31m44mDL >40mg4mStress Yes   Goal To meet with psychosocial counselor for stress and relaxation information and guidance. To state understanding of performing relaxation techniques and or identifying personal stressors.   Intervention Provide education on types of stress, identifiying stressors, and ways to cope with stress. Provide demonstration and active practice of relaxation techniques.      Personal Goals and Risk Factors Review:      Goals and Risk Factor Review      11/15/15 0941 12/06/15 1005 12/13/15 1150       Weight Management   Goals Progress/Improvement seen  Yes Yes     Comments  He hasn't seen significant gain in scale numbers, however, has made very important diet changes  Cut down on sodium and has started to consume more soup and fruits and veggies.   Ken sYvone Neu today he has lost weight but not as quick as he would like. We discussed trying to cut back on his bread intake.      Increase Aerobic Exercise and Physical Activity   Goals Progress/Improvement seen  Yes Yes Yes     Comments Ken iYvone Neuelatively new to class, but is enjoying getting into the exercise portion of the program, and feels better each time he has completed exercise. Ken iYvone Neueeling much stronger.  Aiming to return to prior work levels, which he feels he is doing incrementally and is enjoying striving to make these gains.  Also has been  doing some exercise on days that he is able. Ken aYvone NeuI discussed today and increasing his exercise will burn more calories and hopefully achieve or get closer to his weight loss goal.      Diabetes   Goal  Blood glucose control identified by blood glucose values, HgbA1C. Participant verbalizes understanding of the signs/symptoms of hyper/hypo glycemia, proper foot care and importance of medication and nutrition plan for blood glucose control.      Progress seen towards goals   Yes     Comments   Ken rYvone Neurts his blood sugars have been stable.      Hypertension   Goal  Participant will see blood pressure controlled within the  values of 140/80m/Hg or within value directed by their physician.  BP is remaining within acceptable levels.      Progress seen toward goals   Yes     Comments   KYvone Neuhad a good blood pressure today but frequent PVC's when he was at Level 7 on the Nustep T5 and watts said 100. He is going to take a copy of his PVC with no symptoms to his primary care MD today since he lives near the office.      Abnormal Lipids   Progress seen towards goals   Unknown     Comments   KYvone Neusaid his primary care MD takes care of this.         Personal Goals Discharge (Final Personal Goals and Risk Factors Review):      Goals and Risk Factor Review - 12/13/15 1150    Weight Management   Goals Progress/Improvement seen Yes   Comments Ken siad today he has lost weight but not as quick as he would like. We discussed trying to cut back on his bread intake.    Increase Aerobic Exercise and Physical Activity   Goals Progress/Improvement seen  Yes   Comments KYvone Neuand I discussed today and increasing his exercise will burn more calories and hopefully achieve or get closer to his weight loss goal.    Diabetes   Progress seen towards goals Yes   Comments KYvone Neureports his blood sugars have been stable.    Hypertension   Progress seen toward goals Yes   Comments KYvone Neuhad a good blood pressure today but  frequent PVC's when he was at Level 7 on the Nustep T5 and watts said 100. He is going to take a copy of his PVC with no symptoms to his primary care MD today since he lives near the office.    Abnormal Lipids   Progress seen towards goals Unknown   Comments KYvone Neusaid his primary care MD takes care of this.       ITP Comments:     ITP Comments      11/18/15 1302           ITP Comments Ready for 30 day review.  Continue with ITP   New start to program, has attended three sessions.          Comments: No c/o or symptoms but KYvone Neuhad a lot of PVC's when he exercised today on level 7 with 100 watts seated on the Nustep T5 exercise equipment.

## 2015-12-13 NOTE — Progress Notes (Signed)
Cardiac Individual Treatment Plan  Patient Details  Name: James [name] MRN: [id number] Date of Birth: November 18, 1945 Referring Provider:  Isaias Cowman, MD  Initial Encounter Date:    Visit Diagnosis: S/P PTCA (percutaneous transluminal coronary angioplasty)  ST elevation myocardial infarction (STEMI), unspecified artery ([place])  Patient's Home Medications on Admission:  Current outpatient prescriptions:  .  aspirin 81 MG tablet, Take 81 mg by mouth daily., Disp: , Rfl:  .  atorvastatin (LIPITOR) 80 MG tablet, Take 1 tablet by mouth., Disp: , Rfl:  .  cyanocobalamin (,VITAMIN B-12,) 1000 MCG/ML injection, Inject into the muscle., Disp: , Rfl:  .  Cyanocobalamin (RA VITAMIN B-12 TR) 1000 MCG TBCR, Take by mouth daily. , Disp: , Rfl:  .  finasteride (PROSCAR) 5 MG tablet, Take by mouth., Disp: , Rfl:  .  metoprolol succinate (TOPROL-XL) 50 MG 24 hr tablet, Take by mouth., Disp: , Rfl:  .  ramipril (ALTACE) 10 MG capsule, Take 10 mg by mouth daily., Disp: , Rfl:  .  sildenafil (REVATIO) 20 MG tablet, Take 1 tablet (20 mg total) by mouth 3 (three) times daily., Disp: 90 tablet, Rfl: 0 .  tamsulosin (FLOMAX) 0.4 MG CAPS capsule, Take 1 capsule (0.4 mg total) by mouth daily after supper., Disp: 90 capsule, Rfl: 3 .  ticagrelor (BRILINTA) 90 MG TABS tablet, Take by mouth., Disp: , Rfl:  .  Vitamin D, Cholecalciferol, 1000 UNITS CAPS, Take by mouth daily., Disp: , Rfl:   Past Medical History: Past Medical History  Diagnosis Date  . Stroke ([place])   . Erectile dysfunction   . BPH (benign prostatic hyperplasia)   . Erectile dysfunction   . Hypogonadism in male     Tobacco Use: History  Smoking status  . Former Smoker -- 1.50 packs/day for 25 years  . Quit date: 11/17/1984  Smokeless tobacco  . Never Used    Comment: "years ago"    Labs: Recent Review Flowsheet Data    There is no flowsheet data to display.       Exercise Target Goals:    Exercise Program Goal: Individual  exercise prescription set with THRR, safety & activity barriers. Participant demonstrates ability to understand and report RPE using BORG scale, to self-measure pulse accurately, and to acknowledge the importance of the exercise prescription.  Exercise Prescription Goal: Starting with aerobic activity 30 plus minutes a day, 3 days per week for initial exercise prescription. Provide home exercise prescription and guidelines that participant acknowledges understanding prior to discharge.  Activity Barriers & Risk Stratification:     Activity Barriers & Risk Stratification - 10/24/15 1730    Activity Barriers & Risk Stratification   Activity Barriers None   Risk Stratification High      6 Minute Walk:     6 Minute Walk      10/24/15 1436       6 Minute Walk   Phase Initial     Distance 1300 feet     Walk Time 6 minutes     Resting HR 72 bpm     Resting BP 124/60 mmHg     Max Ex. HR 111 bpm     Max Ex. BP 144/62 mmHg     RPE 8     Symptoms No        Initial Exercise Prescription:     Initial Exercise Prescription - 10/24/15 1400    Date of Initial Exercise Prescription   Date 10/24/15   Treadmill   MPH 2  Grade 0   Minutes 10   Bike   Level 0.2   Minutes 10   Recumbant Bike   Level 3   RPM 40   Watts 25   Minutes 15   NuStep   Level 3   Watts 30   Minutes 15   Arm Ergometer   Level 1   Watts 8   Minutes 10   Arm/Foot Ergometer   Level 4   Watts 15   Minutes 10   Cybex   Level 2   RPM 50   Minutes 10   Recumbant Elliptical   Level 1   RPM 40   Watts 10   Minutes 10   Elliptical   Level 1   Speed 3   Minutes 1   REL-XR   Level 2   Watts 30   Minutes 15   T5 Nustep   Level 2   Watts 15   Minutes 15   Biostep-RELP   Level 2   Watts 15   Minutes 15   Prescription Details   Frequency (times per week) 3   Duration Progress to 30 minutes of continuous aerobic without signs/symptoms of physical distress   Intensity   THRR REST +  30    Ratings of Perceived Exertion 11-15   Progression Continue progressive overload as per policy without signs/symptoms or physical distress.   Resistance Training   Training Prescription Yes   Weight 2   Reps 10-15      Exercise Prescription Changes:     Exercise Prescription Changes      11/15/15 1400 12/04/15 0656         Response to Exercise   Blood Pressure (Admit) 128/78 mmHg 124/70 mmHg      Blood Pressure (Exercise) 142/64 mmHg 162/64 mmHg      Blood Pressure (Exit) 124/76 mmHg 100/64 mmHg      Heart Rate (Admit) 99 bpm 70 bpm      Heart Rate (Exercise) 102 bpm 97 bpm      Heart Rate (Exit) 84 bpm 87 bpm      Rating of Perceived Exertion (Exercise) 12 13      Symptoms none none      Duration Progress to 50 minutes of aerobic without signs/symptoms of physical distress Progress to 50 minutes of aerobic without signs/symptoms of physical distress      Intensity THRR unchanged THRR unchanged      Progression Continue progressive overload as per policy without signs/symptoms or physical distress. Continue progressive overload as per policy without signs/symptoms or physical distress.      Resistance Training   Training Prescription Yes Yes      Weight 3 3      Reps 10-15 10-15      Interval Training   Interval Training No No      Treadmill   MPH 3.2 3.2      Grade 0 0      Minutes 20 20      T5 Nustep   Level 2 2      Watts 25 25      Minutes 10 10         Discharge Exercise Prescription (Final Exercise Prescription Changes):     Exercise Prescription Changes - 12/04/15 0656    Response to Exercise   Blood Pressure (Admit) 124/70 mmHg   Blood Pressure (Exercise) 162/64 mmHg   Blood Pressure (Exit) 100/64 mmHg   Heart Rate (Admit) 70  bpm   Heart Rate (Exercise) 97 bpm   Heart Rate (Exit) 87 bpm   Rating of Perceived Exertion (Exercise) 13   Symptoms none   Duration Progress to 50 minutes of aerobic without signs/symptoms of physical distress   Intensity  THRR unchanged   Progression Continue progressive overload as per policy without signs/symptoms or physical distress.   Resistance Training   Training Prescription Yes   Weight 3   Reps 10-15   Interval Training   Interval Training No   Treadmill   MPH 3.2   Grade 0   Minutes 20   T5 Nustep   Level 2   Watts 25   Minutes 10      Nutrition:  Target Goals: Understanding of nutrition guidelines, daily intake of sodium <154m, cholesterol <2041m calories 30% from fat and 7% or less from saturated fats, daily to have 5 or more servings of fruits and vegetables.  Biometrics:     Pre Biometrics - 10/24/15 1436    Pre Biometrics   Height 5' 10.5" (1.791 m)   Weight 262 lb 4.8 oz (118.978 kg)   Waist Circumference 47.75 inches   Hip Circumference 49 inches   Waist to Hip Ratio 0.97 %   BMI (Calculated) 37.2       Nutrition Therapy Plan and Nutrition Goals:     Nutrition Therapy & Goals - 12/04/15 1237    Nutrition Therapy   Diet Instructed patient on a meal plan based on 1800 calories including DASH diet principles   Drug/Food Interactions Statins/Certain Fruits   Fiber 30 grams   Whole Grain Foods 3 servings   Protein 8 ounces/day   Saturated Fats 12 max. grams   Fruits and Vegetables 5 servings/day   Personal Nutrition Goals   Personal Goal #1 Try Starkist "very low sodium" tuna   Personal Goal #2 Increase intake of whole grains. Try 100% whole wheat pasta and brown rice   Personal Goal #3 Increase fruits and vegetables with goal of 5 servings per day.   Personal Goal #4 Try the mini bags of popcorn verses the large bags   Additional Goals? Yes   Personal Goal #5 Consider a margarine with no trans fat. Refer to list.      Nutrition Discharge: Rate Your Plate Scores:     Rate Your Plate - 1262/83/6692947  Rate Your Plate Scores   Pre Score 58   Pre Score % 64 %      Nutrition Goals Re-Evaluation:     Nutrition Goals Re-Evaluation      12/13/15 1149            Personal Goal #3 Re-Evaluation   Personal Goal #3 KeYvone Neuaid he will try to cut back on the bread that he likes and substitute some vegetables. KeYvone Neuaid it was very helpful when he met with the Cardiac Rehab Registered dietician       Goal Progress Seen Yes          Psychosocial: Target Goals: Acknowledge presence or absence of depression, maximize coping skills, provide positive support system. Participant is able to verbalize types and ability to use techniques and skills needed for reducing stress and depression.  Initial Review & Psychosocial Screening:     Initial Psych Review & Screening - 10/24/15 [place]   Barriers   Psychosocial barriers to participate in program There are no identifiable barriers or psychosocial  needs.   Screening Interventions   Interventions Encouraged to exercise  Pt. has a good support system.  He has his wife Golden Circle, 2 children and 3 grandchildren. He states he does not feel depressed.  He has an affiliation with a [employer].        Quality of Life Scores:     Quality of Life - 10/24/15 1743    Quality of Life Scores   Health/Function Pre 22.32 %   Socioeconomic Pre 30 %   Psych/Spiritual Pre 27.43 %   Family Pre 28.8 %   GLOBAL Pre 26.02 %      PHQ-9:     Recent Review Flowsheet Data    Depression screen [facility] 2/9 10/24/2015   Decreased Interest 0   Down, Depressed, Hopeless 0   PHQ - 2 Score 0   Altered sleeping 1   Tired, decreased energy 1   Change in appetite 0   Feeling bad or failure about yourself  0   Trouble concentrating 0   Moving slowly or fidgety/restless 0   Suicidal thoughts 0   PHQ-9 Score 2   Difficult doing work/chores Not difficult at all      Psychosocial Evaluation and Intervention:     Psychosocial Evaluation - 12/11/15 0951    Psychosocial Evaluation & Interventions   Interventions Encouraged to exercise with the program and follow  exercise prescription;Stress management education;Relaxation education   Comments Counselor met with Mr. Gallina today for initial psychosocial evaluation.  He will be 70 years old next week and continues to work in [employer].  He had a heart attack on 11/3.  He has a strong support system with a spouse of 78 years and adult children who live close by as well as active involvement in his local church community.  Mr. Vandevoorde states he has several other health issues including sleep apnea and erectile dysfunction for which he is being treated.  He denies a history of depression or anxiety or current symptoms and reports he is typically in a positive mood.  He does mention how stressful at times his job is as well as concern over his health issues.   His goals for this program are to lose weight, increase his stamina and have a stronger heart.  he would also like to move around with greater ease or worry of having another heart attack.  Mr. Cocuzza works out at [employer] and plans to return there following this program.     Continued Psychosocial Services Needed Yes  Mr. Schirtzinger will benefit form the stress management and relaxations components of this program.  He will also benefit form meeting with the dietician to address his weight loss goals.        Psychosocial Re-Evaluation:     Psychosocial Re-Evaluation      12/13/15 1154           Psychosocial Re-Evaluation   Interventions Encouraged to attend Cardiac Rehabilitation for the exercise       Comments Yvone Neu said he really wants to lose weight and is trying to exercise more. He realizes that he is only at session 8 or 10 and has 36 sessions of [facility] available to him.           Vocational Rehabilitation: Provide vocational rehab assistance to qualifying candidates.   Vocational Rehab Evaluation & Intervention:     Vocational Rehab - 10/24/15 1732    Initial Vocational Rehab Evaluation & Intervention  Assessment shows need for  Vocational Rehabilitation No      Education: Education Goals: Education classes will be provided on a weekly basis, covering required topics. Participant will state understanding/return demonstration of topics presented.  Learning Barriers/Preferences:     Learning Barriers/Preferences - 10/24/15 1732    Learning Barriers/Preferences   Learning Barriers Sight;Hearing  Pt. had stroke 7 years ago which affected his  ability to process and respond to questions. Responses are delayed at times.  Also pt has limited peripheral vision.  Patient does not drive at night.     Learning Preferences Written Material      Education Topics: General Nutrition Guidelines/Fats and Fiber: -Group instruction provided by verbal, written material, models and posters to present the general guidelines for heart healthy nutrition. Gives an explanation and review of dietary fats and fiber.   Controlling Sodium/Reading Food Labels: -Group verbal and written material supporting the discussion of sodium use in heart healthy nutrition. Review and explanation with models, verbal and written materials for utilization of the food label.   Exercise Physiology & Risk Factors: - Group verbal and written instruction with models to review the exercise physiology of the cardiovascular system and associated critical values. Details cardiovascular disease risk factors and the goals associated with each risk factor.          Cardiac Rehab from 12/13/2015 in [facility] Cardiac and Pulmonary Rehab   Date  11/22/15   Educator  SW   Instruction Review Code  2- meets goals/outcomes      Aerobic Exercise & Resistance Training: - Gives group verbal and written discussion on the health impact of inactivity. On the components of aerobic and resistive training programs and the benefits of this training and how to safely progress through these programs.      Cardiac Rehab from 12/13/2015 in [facility] Cardiac and Pulmonary Rehab   Date   12/04/15   Educator  RM   Instruction Review Code  2- meets goals/outcomes      Flexibility, Balance, General Exercise Guidelines: - Provides group verbal and written instruction on the benefits of flexibility and balance training programs. Provides general exercise guidelines with specific guidelines to those with heart or lung disease. Demonstration and skill practice provided.      Cardiac Rehab from 12/13/2015 in [facility] Cardiac and Pulmonary Rehab   Date  12/04/15   Educator  RM   Instruction Review Code  2- meets goals/outcomes      Stress Management: - Provides group verbal and written instruction about the health risks of elevated stress, cause of high stress, and healthy ways to reduce stress.      Cardiac Rehab from 12/13/2015 in [facility] Cardiac and Pulmonary Rehab   Date  11/29/15   Educator  CE   Instruction Review Code  2- meets goals/outcomes      Depression: - Provides group verbal and written instruction on the correlation between heart/lung disease and depressed mood, treatment options, and the stigmas associated with seeking treatment.   Anatomy & Physiology of the Heart: - Group verbal and written instruction and models provide basic cardiac anatomy and physiology, with the coronary electrical and arterial systems. Review of: AMI, Angina, Valve disease, Heart Failure, Cardiac Arrhythmia, Pacemakers, and the ICD.      Cardiac Rehab from 12/13/2015 in [facility] Cardiac and Pulmonary Rehab   Date  12/06/15   Educator  CE   Instruction Review Code  2- meets goals/outcomes      Cardiac Procedures: -  Group verbal and written instruction and models to describe the testing methods done to diagnose heart disease. Reviews the outcomes of the test results. Describes the treatment choices: Medical Management, Angioplasty, or Coronary Bypass Surgery.   Cardiac Medications: - Group verbal and written instruction to review commonly prescribed medications for heart disease. Reviews the  medication, class of the drug, and side effects. Includes the steps to properly store meds and maintain the prescription regimen.      Cardiac Rehab from 12/13/2015 in [facility] Cardiac and Pulmonary Rehab   Date  11/15/15   Educator  CE   Instruction Review Code  2- meets goals/outcomes      Go Sex-Intimacy & Heart Disease, Get SMART - Goal Setting: - Group verbal and written instruction through game format to discuss heart disease and the return to sexual intimacy. Provides group verbal and written material to discuss and apply goal setting through the application of the S.M.A.R.T. Method.   Other Matters of the Heart: - Provides group verbal, written materials and models to describe Heart Failure, Angina, Valve Disease, and Diabetes in the realm of heart disease. Includes description of the disease process and treatment options available to the cardiac patient.   Exercise & Equipment Safety: - Individual verbal instruction and demonstration of equipment use and safety with use of the equipment.      Cardiac Rehab from 12/13/2015 in [facility] Cardiac and Pulmonary Rehab   Date  10/24/15   Educator  DW   Instruction Review Code  1- partially meets, needs review/practice      Infection Prevention: - Provides verbal and written material to individual with discussion of infection control including proper hand washing and proper equipment cleaning during exercise session.      Cardiac Rehab from 12/13/2015 in [facility] Cardiac and Pulmonary Rehab   Date  10/24/15   Educator  DW   Instruction Review Code  2- meets goals/outcomes      Falls Prevention: - Provides verbal and written material to individual with discussion of falls prevention and safety.      Cardiac Rehab from 12/13/2015 in [facility] Cardiac and Pulmonary Rehab   Date  10/24/15   Educator  DW   Instruction Review Code  2- meets goals/outcomes      Diabetes: - Individual verbal and written instruction to review signs/symptoms of  diabetes, desired ranges of glucose level fasting, after meals and with exercise. Advice that pre and post exercise glucose checks will be done for 3 sessions at entry of program.      Cardiac Rehab from 12/13/2015 in [facility] Cardiac and Pulmonary Rehab   Date  12/13/15   Educator  [place]   Instruction Review Code  2- meets goals/outcomes       Knowledge Questionnaire Score:     Knowledge Questionnaire Score - 11/08/15 1041    Knowledge Questionnaire Score   Pre Score 18      Personal Goals and Risk Factors at Admission:     Personal Goals and Risk Factors at Admission - 10/24/15 1740    Personal Goals and Risk Factors on Admission    Weight Management Yes   Admit Weight 262 lb 4.8 oz (118.978 kg)   Goal Weight 200 lb (90.719 kg)   Increase Aerobic Exercise and Physical Activity Yes   Intervention While in program, learn and follow the exercise prescription taught. Start at a low level workload and increase workload after able to maintain previous level for 30 minutes. Increase time before  increasing intensity.   Take Less Medication Yes   Intervention Learn your risk factors and begin the lifestyle modifications for risk factor control during your time in the program.   Understand more about Heart/Pulmonary Disease. Yes   Intervention While in program utilize professionals for any questions, and attend the education sessions. Great websites to use are www.americanheart.org or www.lung.org for reliable information.   Diabetes Yes   Goal Blood glucose control identified by blood glucose values, HgbA1C. Participant verbalizes understanding of the signs/symptoms of hyper/hypo glycemia, proper foot care and importance of medication and nutrition plan for blood glucose control.   Intervention Provide nutrition & aerobic exercise along with prescribed medications to achieve blood glucose in normal ranges: Fasting 65-99 mg/dL   Hypertension Yes   Goal Participant will see blood pressure  controlled within the values of 140/10m/Hg or within value directed by their physician.   Intervention Provide nutrition & aerobic exercise along with prescribed medications to achieve BP 140/90 or less.   Lipids Yes   Goal Cholesterol controlled with medications as prescribed, with individualized exercise RX and with personalized nutrition plan. Value goals: LDL < 781m HDL > 4054mParticipant states understanding of desired cholesterol values and following prescriptions.   Intervention Provide nutrition & aerobic exercise along with prescribed medications to achieve LDL <31m44mDL >40mg4mStress Yes   Goal To meet with psychosocial counselor for stress and relaxation information and guidance. To state understanding of performing relaxation techniques and or identifying personal stressors.   Intervention Provide education on types of stress, identifiying stressors, and ways to cope with stress. Provide demonstration and active practice of relaxation techniques.      Personal Goals and Risk Factors Review:      Goals and Risk Factor Review      11/15/15 0941 12/06/15 1005 12/13/15 1150       Weight Management   Goals Progress/Improvement seen  Yes Yes     Comments  He hasn't seen significant gain in scale numbers, however, has made very important diet changes  Cut down on sodium and has started to consume more soup and fruits and veggies.   Ken sYvone Neu today he has lost weight but not as quick as he would like. We discussed trying to cut back on his bread intake.      Increase Aerobic Exercise and Physical Activity   Goals Progress/Improvement seen  Yes Yes Yes     Comments Ken iYvone Neuelatively new to class, but is enjoying getting into the exercise portion of the program, and feels better each time he has completed exercise. Ken iYvone Neueeling much stronger.  Aiming to return to prior work levels, which he feels he is doing incrementally and is enjoying striving to make these gains.  Also has been  doing some exercise on days that he is able. Ken aYvone NeuI discussed today and increasing his exercise will burn more calories and hopefully achieve or get closer to his weight loss goal.      Diabetes   Goal  Blood glucose control identified by blood glucose values, HgbA1C. Participant verbalizes understanding of the signs/symptoms of hyper/hypo glycemia, proper foot care and importance of medication and nutrition plan for blood glucose control.      Progress seen towards goals   Yes     Comments   Ken rYvone Neurts his blood sugars have been stable.      Hypertension   Goal  Participant will see blood pressure controlled within the  values of 140/37m/Hg or within value directed by their physician.  BP is remaining within acceptable levels.      Progress seen toward goals   Yes     Comments   KYvone Neuhad a good blood pressure today but frequent PVC's when he was at Level 7 on the Nustep T5 and watts said 100. He is going to take a copy of his PVC with no symptoms to his primary care MD today since he lives near the office.      Abnormal Lipids   Progress seen towards goals   Unknown     Comments   KYvone Neusaid his primary care MD takes care of this.         Personal Goals Discharge (Final Personal Goals and Risk Factors Review):      Goals and Risk Factor Review - 12/13/15 1150    Weight Management   Goals Progress/Improvement seen Yes   Comments Ken siad today he has lost weight but not as quick as he would like. We discussed trying to cut back on his bread intake.    Increase Aerobic Exercise and Physical Activity   Goals Progress/Improvement seen  Yes   Comments KYvone Neuand I discussed today and increasing his exercise will burn more calories and hopefully achieve or get closer to his weight loss goal.    Diabetes   Progress seen towards goals Yes   Comments KYvone Neureports his blood sugars have been stable.    Hypertension   Progress seen toward goals Yes   Comments KYvone Neuhad a good blood pressure today but  frequent PVC's when he was at Level 7 on the Nustep T5 and watts said 100. He is going to take a copy of his PVC with no symptoms to his primary care MD today since he lives near the office.    Abnormal Lipids   Progress seen towards goals Unknown   Comments KYvone Neusaid his primary care MD takes care of this.       ITP Comments:     ITP Comments      11/18/15 1302 12/13/15 1242         ITP Comments Ready for 30 day review.  Continue with ITP   New start to program, has attended three sessions. Ready for 30 day review. Continue with ITP.         Comments:

## 2015-12-13 NOTE — Progress Notes (Signed)
Daily Session Note  Patient Details  Name: Naziah Weckerly MRN: [id number] Date of Birth: May 07, 1946 Referring Provider:  Isaias Cowman, MD  Encounter Date: 12/13/2015  Check In:     Session Check In - 12/13/15 0846    Check-In   Staff Present Gerlene Burdock, RN, BSN;Macauley Mossberg Caprice Beaver, BS, Exercise Physiologist;Steven Way, BS, ACSM EP-C, Exercise Physiologist   ER physicians immediately available to respond to emergencies See telemetry face sheet for immediately available ER MD   Medication changes reported     No   Fall or balance concerns reported    No   Warm-up and Cool-down Performed on first and last piece of equipment   VAD Patient? No   Pain Assessment   Currently in Pain? No/denies   Multiple Pain Sites No         Goals Met:  Independence with exercise equipment Exercise tolerated well No report of cardiac concerns or symptoms Strength training completed today  Goals Unmet:  Not Applicable  Goals Comments:    Dr. Emily Filbert is Medical Director for [facility] and [facility].

## 2015-12-19 NOTE — Addendum Note (Signed)
Addended by: Rudy Jew on: 12/19/2015 07:28 AM   Modules accepted: Orders

## 2015-12-20 ENCOUNTER — Encounter: Payer: Medicare Other | Attending: Cardiology | Admitting: *Deleted

## 2015-12-20 DIAGNOSIS — Z9861 Coronary angioplasty status: Secondary | ICD-10-CM | POA: Insufficient documentation

## 2015-12-20 DIAGNOSIS — I213 ST elevation (STEMI) myocardial infarction of unspecified site: Secondary | ICD-10-CM | POA: Diagnosis present

## 2015-12-20 NOTE — Progress Notes (Signed)
Daily Session Note  Patient Details  Name: Jandiel Magallanes MRN: [id number] Date of Birth: 10-08-1946 Referring Provider:  Isaias Cowman, MD  Encounter Date: 12/20/2015  Check In:     Session Check In - 12/20/15 0941    Check-In   Staff Present Heath Lark, RN, BSN, CCRP;Carroll Enterkin, RN, Apolonio Schneiders, BS, Exercise Physiologist   ER physicians immediately available to respond to emergencies See telemetry face sheet for immediately available ER MD   Medication changes reported     No   Fall or balance concerns reported    No   Warm-up and Cool-down Performed on first and last piece of equipment   VAD Patient? No   Pain Assessment   Currently in Pain? No/denies           Exercise Prescription Changes - 12/20/15 0900    Resistance Training   Training Prescription Yes   Weight 3   Reps 10-15   Interval Training   Interval Training Yes   Equipment NuStep   Comments T5 level 6-6 at 70 watts   Treadmill   MPH 3.2   Grade 0   Minutes 20   T5 Nustep   Level 7   Watts 25   Minutes 10      Goals Met:  Exercise tolerated well No report of cardiac concerns or symptoms Strength training completed today  Goals Unmet:  Not Applicable  Goals Comments: Doing well with exercise prescription progression.    Dr. Emily Filbert is Medical Director for [facility].

## 2015-12-25 ENCOUNTER — Encounter: Payer: Medicare Other | Admitting: *Deleted

## 2015-12-25 DIAGNOSIS — Z9861 Coronary angioplasty status: Secondary | ICD-10-CM

## 2015-12-25 DIAGNOSIS — I213 ST elevation (STEMI) myocardial infarction of unspecified site: Secondary | ICD-10-CM | POA: Diagnosis not present

## 2015-12-25 NOTE — Progress Notes (Signed)
Daily Session Note  Patient Details  Name: Tymir Terral MRN: [id number] Date of Birth: 08/24/1946 Referring Provider:  Isaias Cowman, MD  Encounter Date: 12/25/2015  Check In:     Session Check In - 12/25/15 0855    Check-In   Location [facility]   Staff Present Candiss Norse, MS, ACSM CEP, Exercise Physiologist;Carroll Enterkin, RN, Jana Half, RN, BSN   Supervising physician immediately available to respond to emergencies See telemetry face sheet for immediately available ER MD   Medication changes reported     No   Fall or balance concerns reported    No   Warm-up and Cool-down Performed on first and last piece of equipment   Resistance Training Performed Yes   VAD Patient? No   Pain Assessment   Currently in Pain? No/denies   Multiple Pain Sites No         Goals Met:  Independence with exercise equipment Exercise tolerated well No report of cardiac concerns or symptoms Strength training completed today  Goals Unmet:  Not Applicable  Goals Comments: Patient completed exercise prescription and all exercise goals during rehab session. The exercise was tolerated well and the patient is progressing in the program.    Dr. Emily Filbert is Medical Director for [place] [facility].

## 2015-12-27 ENCOUNTER — Encounter: Payer: Medicare Other | Admitting: *Deleted

## 2015-12-27 DIAGNOSIS — Z9861 Coronary angioplasty status: Secondary | ICD-10-CM

## 2015-12-27 DIAGNOSIS — I213 ST elevation (STEMI) myocardial infarction of unspecified site: Secondary | ICD-10-CM | POA: Diagnosis not present

## 2015-12-27 NOTE — Progress Notes (Signed)
Daily Session Note  Patient Details  Name: James [name] MRN: [id number] Date of Birth: [birth year] Referring Provider:  Isaias Cowman, MD  Encounter Date: 12/27/2015  Check In:     Session Check In - 12/27/15 0915    Check-In   Staff Present Heath Lark, RN, BSN, CCRP;Carroll Enterkin, RN, Jana Half, RN, BSN   Supervising physician immediately available to respond to emergencies See telemetry face sheet for immediately available ER MD   Medication changes reported     No   Fall or balance concerns reported    No   Warm-up and Cool-down Performed on first and last piece of equipment   Resistance Training Performed No   VAD Patient? No         Goals Met:  Independence with exercise equipment Exercise tolerated well No report of cardiac concerns or symptoms  Goals Unmet:  Not Applicable  Goals Comments:  Patient completed exercise prescription and all exercise goals during rehab session. The exercise was tolerated well and the patient is progressing in the program.    Dr. Emily Filbert is Medical Director for [place] [facility].

## 2016-01-01 ENCOUNTER — Encounter: Payer: Medicare Other | Admitting: *Deleted

## 2016-01-01 DIAGNOSIS — I213 ST elevation (STEMI) myocardial infarction of unspecified site: Secondary | ICD-10-CM | POA: Diagnosis not present

## 2016-01-01 DIAGNOSIS — Z9861 Coronary angioplasty status: Secondary | ICD-10-CM

## 2016-01-01 NOTE — Progress Notes (Signed)
Daily Session Note  Patient Details  Name: James [name] MRN: [id number] Date of Birth: Apr 03, 1946 Referring Provider:  Isaias Cowman, MD  Encounter Date: 01/01/2016  Check In:     Session Check In - 01/01/16 0906    Check-In   Location [facility]   Staff Present Candiss Norse, MS, ACSM CEP, Exercise Physiologist;Diane Joya Gaskins, RN, BSN;Carroll Enterkin, RN, BSN   Supervising physician immediately available to respond to emergencies See telemetry face sheet for immediately available ER MD   Medication changes reported     No   Fall or balance concerns reported    No   Warm-up and Cool-down Performed on first and last piece of equipment   Resistance Training Performed Yes   VAD Patient? No   Pain Assessment   Currently in Pain? No/denies   Multiple Pain Sites No         Goals Met:  Independence with exercise equipment Exercise tolerated well No report of cardiac concerns or symptoms Strength training completed today  Goals Unmet:  Not Applicable  Goals Comments: Patient completed exercise prescription and all exercise goals during rehab session. The exercise was tolerated well and the patient is progressing in the program.    Dr. Emily Filbert is Medical Director for [place] and [facility].

## 2016-01-03 ENCOUNTER — Encounter: Payer: Medicare Other | Admitting: *Deleted

## 2016-01-03 DIAGNOSIS — I213 ST elevation (STEMI) myocardial infarction of unspecified site: Secondary | ICD-10-CM | POA: Diagnosis not present

## 2016-01-03 DIAGNOSIS — Z9861 Coronary angioplasty status: Secondary | ICD-10-CM

## 2016-01-03 NOTE — Progress Notes (Signed)
Daily Session Note  Patient Details  Name: James [name] MRN: [id number] Date of Birth: 1946/03/31 Referring Provider:  Isaias Cowman, MD  Encounter Date: 01/03/2016  Check In:     Session Check In - 01/03/16 0903    Check-In   Staff Present Gerlene Burdock, RN, BSN;Diane Joya Gaskins, RN, BSN;Mehkai Gallo, RN, BSN, [facility]   Supervising physician immediately available to respond to emergencies See telemetry face sheet for immediately available ER MD   Medication changes reported     No   Fall or balance concerns reported    No   Warm-up and Cool-down Performed on first and last piece of equipment   Resistance Training Performed No   VAD Patient? No   Pain Assessment   Currently in Pain? No/denies         Goals Met:  Independence with exercise equipment Exercise tolerated well No report of cardiac concerns or symptoms Strength training completed today  Goals Unmet:  Not Applicable  Goals Comments: Doing well with exercise prescription progression.    Dr. Emily Filbert is Medical Director for [place] [facility].

## 2016-01-08 ENCOUNTER — Encounter: Payer: Medicare Other | Admitting: *Deleted

## 2016-01-08 DIAGNOSIS — I213 ST elevation (STEMI) myocardial infarction of unspecified site: Secondary | ICD-10-CM | POA: Diagnosis not present

## 2016-01-08 DIAGNOSIS — Z9861 Coronary angioplasty status: Secondary | ICD-10-CM

## 2016-01-08 NOTE — Progress Notes (Signed)
Daily Session Note  Patient Details  Name: James [name] MRN: [id number] Date of Birth: [birth year] Referring Provider:  Isaias Cowman, MD  Encounter Date: 01/08/2016  Check In:     Session Check In - 01/08/16 1001    Check-In   Location [facility]   Staff Present Candiss Norse, MS, ACSM CEP, Exercise Physiologist;Mary Kellie Shropshire, RN;Diane Joya Gaskins, RN, BSN   Supervising physician immediately available to respond to emergencies See telemetry face sheet for immediately available ER MD   Medication changes reported     No   Fall or balance concerns reported    No   Warm-up and Cool-down Performed on first and last piece of equipment   Resistance Training Performed Yes   VAD Patient? No   Pain Assessment   Currently in Pain? No/denies   Multiple Pain Sites No           Exercise Prescription Changes - 01/08/16 1000    Exercise Review   Progression Yes   Response to Exercise   Symptoms None   Comments Yvone Neu can continuously exercise for the entire 45 minutes of aerobic exercise and exercise progression is now focused on intensity.Marland Kitchen Yvone Neu has been doing interval training for the last several sessions and is very compliant with it. We will continue to offer tweaks in his interval program in order to promote continuous progression.    Frequency Add 2 additional days to program exercise sessions.  Yvone Neu is going to start light exercise at [employer]   Duration Progress to 50 minutes of aerobic without signs/symptoms of physical distress   Intensity Rest + 30   Progression Continue progressive overload as per policy without signs/symptoms or physical distress.   Resistance Training   Training Prescription Yes   Weight 4   Reps 10-15   Interval Training   Interval Training Yes   Equipment NuStep   Comments T5 level 6-7 at 60-70 watts   Treadmill   MPH 3.2   Grade 0   Minutes 20   T5 Nustep   Level 7   Watts 100   Minutes 40      Goals Met:   Independence with exercise equipment Exercise tolerated well Personal goals reviewed No report of cardiac concerns or symptoms Strength training completed today  Goals Unmet:  Not Applicable  Goals Comments: Patient completed exercise prescription and all exercise goals during rehab session. The exercise was tolerated well and the patient is progressing in the program.    Dr. Emily Filbert is Medical Director for [place].

## 2016-01-08 NOTE — Progress Notes (Signed)
Cardiac Individual Treatment Plan  Patient Details  Name: Ranny Wiebelhaus MRN: [id number] Date of Birth: [birth year] Referring Provider:  Isaias Cowman, MD  Initial Encounter Date:    Visit Diagnosis: S/P PTCA (percutaneous transluminal coronary angioplasty)  ST elevation myocardial infarction (STEMI), unspecified artery ([place])  Patient's Home Medications on Admission:  Current outpatient prescriptions:  .  aspirin 81 MG tablet, Take 81 mg by mouth daily., Disp: , Rfl:  .  atorvastatin (LIPITOR) 80 MG tablet, Take 1 tablet by mouth., Disp: , Rfl:  .  cyanocobalamin (,VITAMIN B-12,) 1000 MCG/ML injection, Inject into the muscle., Disp: , Rfl:  .  Cyanocobalamin (RA VITAMIN B-12 TR) 1000 MCG TBCR, Take by mouth daily. , Disp: , Rfl:  .  finasteride (PROSCAR) 5 MG tablet, Take by mouth., Disp: , Rfl:  .  metoprolol succinate (TOPROL-XL) 50 MG 24 hr tablet, Take by mouth., Disp: , Rfl:  .  ramipril (ALTACE) 10 MG capsule, Take 10 mg by mouth daily., Disp: , Rfl:  .  sildenafil (REVATIO) 20 MG tablet, Take 1 tablet (20 mg total) by mouth 3 (three) times daily., Disp: 90 tablet, Rfl: 0 .  tamsulosin (FLOMAX) 0.4 MG CAPS capsule, Take 1 capsule (0.4 mg total) by mouth daily after supper., Disp: 90 capsule, Rfl: 3 .  ticagrelor (BRILINTA) 90 MG TABS tablet, Take by mouth., Disp: , Rfl:  .  Vitamin D, Cholecalciferol, 1000 UNITS CAPS, Take by mouth daily., Disp: , Rfl:   Past Medical History: Past Medical History  Diagnosis Date  . Stroke ([place])   . Erectile dysfunction   . BPH (benign prostatic hyperplasia)   . Erectile dysfunction   . Hypogonadism in male     Tobacco Use: History  Smoking status  . Former Smoker -- 1.50 packs/day for 25 years  . Quit date: 11/17/1984  Smokeless tobacco  . Never Used    Comment: "years ago"    Labs: Recent Review Flowsheet Data    There is no flowsheet data to display.       Exercise Target Goals:    Exercise Program Goal: Individual  exercise prescription set with THRR, safety & activity barriers. Participant demonstrates ability to understand and report RPE using BORG scale, to self-measure pulse accurately, and to acknowledge the importance of the exercise prescription.  Exercise Prescription Goal: Starting with aerobic activity 30 plus minutes a day, 3 days per week for initial exercise prescription. Provide home exercise prescription and guidelines that participant acknowledges understanding prior to discharge.  Activity Barriers & Risk Stratification:     Activity Barriers & Cardiac Risk Stratification - 10/24/15 1730    Activity Barriers & Cardiac Risk Stratification   Activity Barriers None   Cardiac Risk Stratification High      6 Minute Walk:     6 Minute Walk      10/24/15 1436       6 Minute Walk   Phase Initial     Distance 1300 feet     Walk Time 6 minutes     RPE 8     Symptoms No     Resting HR 72 bpm     Resting BP 124/60 mmHg     Max Ex. HR 111 bpm     Max Ex. BP 144/62 mmHg        Initial Exercise Prescription:     Initial Exercise Prescription - 10/24/15 1400    Date of Initial Exercise Prescription   Date 10/24/15   Treadmill  MPH 2   Grade 0   Minutes 10   Bike   Level 0.2   Minutes 10   Recumbant Bike   Level 3   RPM 40   Watts 25   Minutes 15   NuStep   Level 3   Watts 30   Minutes 15   Arm Ergometer   Level 1   Watts 8   Minutes 10   Arm/Foot Ergometer   Level 4   Watts 15   Minutes 10   Cybex   Level 2   RPM 50   Minutes 10   Recumbant Elliptical   Level 1   RPM 40   Watts 10   Minutes 10   Elliptical   Level 1   Speed 3   Minutes 1   REL-XR   Level 2   Watts 30   Minutes 15   T5 Nustep   Level 2   Watts 15   Minutes 15   Biostep-RELP   Level 2   Watts 15   Minutes 15   Prescription Details   Frequency (times per week) 3   Duration Progress to 30 minutes of continuous aerobic without signs/symptoms of physical distress    Intensity   THRR REST +  30   Ratings of Perceived Exertion 11-15   Progression Continue progressive overload as per policy without signs/symptoms or physical distress.   Resistance Training   Training Prescription Yes   Weight 2   Reps 10-15      Exercise Prescription Changes:     Exercise Prescription Changes      11/15/15 1400 12/04/15 0656 12/20/15 0900 12/25/15 0900 01/03/16 0648   Exercise Review   Progression    Yes Yes   Response to Exercise   Blood Pressure (Admit) 128/78 mmHg 124/70 mmHg   122/64 mmHg   Blood Pressure (Exercise) 142/64 mmHg 162/64 mmHg   160/64 mmHg   Blood Pressure (Exit) 124/76 mmHg 100/64 mmHg   110/62 mmHg   Heart Rate (Admit) 99 bpm 70 bpm   61 bpm   Heart Rate (Exercise) 102 bpm 97 bpm   95 bpm   Heart Rate (Exit) 84 bpm 87 bpm   68 bpm   Rating of Perceived Exertion (Exercise) _0 Symptoms none none  None None   Comments    Reviewed individualized exercise prescription and made increases per departmental policy. Exercise increases were discussed with the patient and they were able to perform the new work loads without issue (no signs or symptoms).  Yvone Neu can continuously exercise for the entire 45 minutes of aerobic exercise and exercise progression is now focused on intensity.Marland Kitchen Yvone Neu has been doing interval training for the last several sessions and is very compliant with it. We will continue to offer tweaks in his interval program in order to promote continuous progression.    Frequency     Add 2 additional days to program exercise sessions.   Duration Progress to 50 minutes of aerobic without signs/symptoms of physical distress Progress to 50 minutes of aerobic without signs/symptoms of physical distress  Progress to 50 minutes of aerobic without signs/symptoms of physical distress Progress to 50 minutes of aerobic without signs/symptoms of physical distress   Intensity THRR unchanged THRR unchanged  Rest + 30 Rest + 30   Progression Continue  progressive overload as per policy without signs/symptoms or physical distress. Continue progressive overload as per policy without signs/symptoms or physical  distress.  Continue progressive overload as per policy without signs/symptoms or physical distress. Continue progressive overload as per policy without signs/symptoms or physical distress.   Resistance Training   Training Prescription _0    Weight _1 Reps 10-15 10-15 10-15 10-15 10-15   Interval Training   Interval Training No No Yes Yes Yes   Equipment   NuStep NuStep NuStep   Comments   T5 level 6-6 at 70 watts T5 level 6-7 at 60-70 watts T5 level 6-7 at 60-70 watts   Treadmill   MPH 3.2 3.2 3.2 3.2 3.2   Grade 0 0 0 0 0   Minutes _2 T5 Nustep   Level _3 Watts _4 60 100   Minutes _5 40      Discharge Exercise Prescription (Final Exercise Prescription Changes):     Exercise Prescription Changes - 01/03/16 0648    Exercise Review   Progression Yes   Response to Exercise   Blood Pressure (Admit) 122/64 mmHg   Blood Pressure (Exercise) 160/64 mmHg   Blood Pressure (Exit) 110/62 mmHg   Heart Rate (Admit) 61 bpm   Heart Rate (Exercise) 95 bpm   Heart Rate (Exit) 68 bpm   Rating of Perceived Exertion (Exercise) 9   Symptoms None   Comments Yvone Neu can continuously exercise for the entire 45 minutes of aerobic exercise and exercise progression is now focused on intensity.Marland Kitchen Yvone Neu has been doing interval training for the last several sessions and is very compliant with it. We will continue to offer tweaks in his interval program in order to promote continuous progression.    Frequency Add 2 additional days to program exercise sessions.   Duration Progress to 50 minutes of aerobic without signs/symptoms of physical distress   Intensity Rest + 30   Progression Continue progressive overload as per policy without signs/symptoms or physical distress.   Resistance Training    Training Prescription Yes   Weight 4   Reps 10-15   Interval Training   Interval Training Yes   Equipment NuStep   Comments T5 level 6-7 at 60-70 watts   Treadmill   MPH 3.2   Grade 0   Minutes 20   T5 Nustep   Level 7   Watts 100   Minutes 40      Nutrition:  Target Goals: Understanding of nutrition guidelines, daily intake of sodium <1515m, cholesterol <2042m calories 30% from fat and 7% or less from saturated fats, daily to have 5 or more servings of fruits and vegetables.  Biometrics:     Pre Biometrics - 10/24/15 1436    Pre Biometrics   Height 5' 10.5" (1.791 m)   Weight 262 lb 4.8 oz (118.978 kg)   Waist Circumference 47.75 inches   Hip Circumference 49 inches   Waist to Hip Ratio 0.97 %   BMI (Calculated) 37.2       Nutrition Therapy Plan and Nutrition Goals:     Nutrition Therapy & Goals - 12/04/15 1237    Nutrition Therapy   Diet Instructed patient on a meal plan based on 1800 calories including DASH diet principles   Drug/Food Interactions Statins/Certain Fruits   Fiber 30 grams   Whole Grain Foods 3 servings   Protein 8 ounces/day   Saturated Fats 12 max. grams   Fruits and Vegetables 5 servings/day   Personal Nutrition  Goals   Personal Goal #1 Try Starkist "very low sodium" tuna   Personal Goal #2 Increase intake of whole grains. Try 100% whole wheat pasta and brown rice   Personal Goal #3 Increase fruits and vegetables with goal of 5 servings per day.   Personal Goal #4 Try the mini bags of popcorn verses the large bags   Additional Goals? Yes   Personal Goal #5 Consider a margarine with no trans fat. Refer to list.      Nutrition Discharge: Rate Your Plate Scores:     Rate Your Plate - 40/34/74 2595    Rate Your Plate Scores   Pre Score 58   Pre Score % 64 %      Nutrition Goals Re-Evaluation:     Nutrition Goals Re-Evaluation      12/13/15 1149 12/25/15 1057         Personal Goal #2 Re-Evaluation   Personal Goal #2  Yvone Neu  said he has tried 100% whole wheat.       Goal Progress Seen  Yes      Personal Goal #3 Re-Evaluation   Personal Goal #3 Yvone Neu said he will try to cut back on the bread that he likes and substitute some vegetables. Yvone Neu said it was very helpful when he met with the Cardiac Rehab Registered dietician       Goal Progress Seen Yes       Personal Goal #5 Re-Evaluation   Personal Goal #5  Discussed with Yvone Neu about fat in [place] vs saturated fat in beef. Yvone Neu says he eats out alot at [employer] like [employer] and I suggested he ask for there calorie nutrition guide (since Yvone Neu doesn't like to look up things on the computer).       Goal Progress Seen  Yes         Psychosocial: Target Goals: Acknowledge presence or absence of depression, maximize coping skills, provide positive support system. Participant is able to verbalize types and ability to use techniques and skills needed for reducing stress and depression.  Initial Review & Psychosocial Screening:     Initial Psych Review & Screening - 10/24/15 [place]? Yes   Barriers   Psychosocial barriers to participate in program There are no identifiable barriers or psychosocial needs.   Screening Interventions   Interventions Encouraged to exercise  Pt. has a good support system.  He has his wife Golden Circle, 2 children and 3 grandchildren. He states he does not feel depressed.  He has an affiliation with a [employer].        Quality of Life Scores:     Quality of Life - 10/24/15 1743    Quality of Life Scores   Health/Function Pre 22.32 %   Socioeconomic Pre 30 %   Psych/Spiritual Pre 27.43 %   Family Pre 28.8 %   GLOBAL Pre 26.02 %      PHQ-9:     Recent Review Flowsheet Data    Depression screen [facility] 2/9 10/24/2015   Decreased Interest 0   Down, Depressed, Hopeless 0   PHQ - 2 Score 0   Altered sleeping 1   Tired, decreased energy 1   Change in appetite 0   Feeling bad or  failure about yourself  0   Trouble concentrating 0   Moving slowly or fidgety/restless 0   Suicidal thoughts 0   PHQ-9 Score 2   Difficult  doing work/chores Not difficult at all      Psychosocial Evaluation and Intervention:     Psychosocial Evaluation - 12/11/15 0951    Psychosocial Evaluation & Interventions   Interventions Encouraged to exercise with the program and follow exercise prescription;Stress management education;Relaxation education   Comments Counselor met with Mr. [place] today for initial psychosocial evaluation.  He will be 70 years old next week and continues to work in [employer].  He had a heart attack on 11/3.  He has a strong support system with a spouse of 58 years and adult children who live close by as well as active involvement in his local church community.  Mr. Reicher states he has several other health issues including sleep apnea and erectile dysfunction for which he is being treated.  He denies a history of depression or anxiety or current symptoms and reports he is typically in a positive mood.  He does mention how stressful at times his job is as well as concern over his health issues.   His goals for this program are to lose weight, increase his stamina and have a stronger heart.  he would also like to move around with greater ease or worry of having another heart attack.  Mr. Tracz works out at [employer] and plans to return there following this program.     Continued Psychosocial Services Needed Yes  Mr. Cicero will benefit form the stress management and relaxations components of this program.  He will also benefit form meeting with the dietician to address his weight loss goals.        Psychosocial Re-Evaluation:     Psychosocial Re-Evaluation      12/13/15 1154 12/25/15 1100         Psychosocial Re-Evaluation   Interventions Encouraged to attend Cardiac Rehabilitation for the exercise       Comments Yvone Neu said he really wants to lose weight and is trying  to exercise more. He realizes that he is only at session 8 or 10 and has 36 sessions of [facility] available to him.  Yvone Neu said he feels things are going well but was hoping exercise would drop his weight more. He gets to eat out a lot with his wife he said.          Vocational Rehabilitation: Provide vocational rehab assistance to qualifying candidates.   Vocational Rehab Evaluation & Intervention:     Vocational Rehab - 10/24/15 1732    Initial Vocational Rehab Evaluation & Intervention   Assessment shows need for Vocational Rehabilitation No      Education: Education Goals: Education classes will be provided on a weekly basis, covering required topics. Participant will state understanding/return demonstration of topics presented.  Learning Barriers/Preferences:     Learning Barriers/Preferences - 10/24/15 1732    Learning Barriers/Preferences   Learning Barriers Sight;Hearing  Pt. had stroke 7 years ago which affected his  ability to process and respond to questions. Responses are delayed at times.  Also pt has limited peripheral vision.  Patient does not drive at night.     Learning Preferences Written Material      Education Topics: General Nutrition Guidelines/Fats and Fiber: -Group instruction provided by verbal, written material, models and posters to present the general guidelines for heart healthy nutrition. Gives an explanation and review of dietary fats and fiber.          Cardiac Rehab from 01/03/2016 in [facility] Cardiac and Pulmonary Rehab   Date  12/25/15  Educator  CR   Instruction Review Code  2- meets goals/outcomes      Controlling Sodium/Reading Food Labels: -Group verbal and written material supporting the discussion of sodium use in heart healthy nutrition. Review and explanation with models, verbal and written materials for utilization of the food label.      Cardiac Rehab from 01/03/2016 in [facility] Cardiac and Pulmonary Rehab   Date  01/01/16   Educator   PI   Instruction Review Code  2- meets goals/outcomes      Exercise Physiology & Risk Factors: - Group verbal and written instruction with models to review the exercise physiology of the cardiovascular system and associated critical values. Details cardiovascular disease risk factors and the goals associated with each risk factor.      Cardiac Rehab from 01/03/2016 in [facility] Cardiac and Pulmonary Rehab   Date  11/22/15   Educator  SW   Instruction Review Code  2- meets goals/outcomes      Aerobic Exercise & Resistance Training: - Gives group verbal and written discussion on the health impact of inactivity. On the components of aerobic and resistive training programs and the benefits of this training and how to safely progress through these programs.      Cardiac Rehab from 01/03/2016 in [facility] Cardiac and Pulmonary Rehab   Date  12/04/15   Educator  RM   Instruction Review Code  2- meets goals/outcomes      Flexibility, Balance, General Exercise Guidelines: - Provides group verbal and written instruction on the benefits of flexibility and balance training programs. Provides general exercise guidelines with specific guidelines to those with heart or lung disease. Demonstration and skill practice provided.      Cardiac Rehab from 01/03/2016 in [facility] Cardiac and Pulmonary Rehab   Date  12/04/15   Educator  RM   Instruction Review Code  2- meets goals/outcomes      Stress Management: - Provides group verbal and written instruction about the health risks of elevated stress, cause of high stress, and healthy ways to reduce stress.      Cardiac Rehab from 01/03/2016 in [facility] Cardiac and Pulmonary Rehab   Date  11/29/15   Educator  CE   Instruction Review Code  2- meets goals/outcomes      Depression: - Provides group verbal and written instruction on the correlation between heart/lung disease and depressed mood, treatment options, and the stigmas associated with seeking  treatment.   Anatomy & Physiology of the Heart: - Group verbal and written instruction and models provide basic cardiac anatomy and physiology, with the coronary electrical and arterial systems. Review of: AMI, Angina, Valve disease, Heart Failure, Cardiac Arrhythmia, Pacemakers, and the ICD.      Cardiac Rehab from 01/03/2016 in [facility] Cardiac and Pulmonary Rehab   Date  12/06/15   Educator  CE   Instruction Review Code  2- meets goals/outcomes      Cardiac Procedures: - Group verbal and written instruction and models to describe the testing methods done to diagnose heart disease. Reviews the outcomes of the test results. Describes the treatment choices: Medical Management, Angioplasty, or Coronary Bypass Surgery.      Cardiac Rehab from 01/03/2016 in [facility] Cardiac and Pulmonary Rehab   Date  01/03/16   Educator  CE   Instruction Review Code  2- meets goals/outcomes      Cardiac Medications: - Group verbal and written instruction to review commonly prescribed medications for heart disease. Reviews the medication, class of  the drug, and side effects. Includes the steps to properly store meds and maintain the prescription regimen.      Cardiac Rehab from 01/03/2016 in [facility] Cardiac and Pulmonary Rehab   Date  12/27/15   Educator  DW   Instruction Review Code  2- meets goals/outcomes      Go Sex-Intimacy & Heart Disease, Get SMART - Goal Setting: - Group verbal and written instruction through game format to discuss heart disease and the return to sexual intimacy. Provides group verbal and written material to discuss and apply goal setting through the application of the S.M.A.R.T. Method.      Cardiac Rehab from 01/03/2016 in [facility] Cardiac and Pulmonary Rehab   Date  01/03/16   Educator  CE   Instruction Review Code  2- meets goals/outcomes      Other Matters of the Heart: - Provides group verbal, written materials and models to describe Heart Failure, Angina, Valve Disease, and Diabetes  in the realm of heart disease. Includes description of the disease process and treatment options available to the cardiac patient.   Exercise & Equipment Safety: - Individual verbal instruction and demonstration of equipment use and safety with use of the equipment.      Cardiac Rehab from 01/03/2016 in [facility] Cardiac and Pulmonary Rehab   Date  10/24/15   Educator  DW   Instruction Review Code  1- partially meets, needs review/practice      Infection Prevention: - Provides verbal and written material to individual with discussion of infection control including proper hand washing and proper equipment cleaning during exercise session.      Cardiac Rehab from 01/03/2016 in [facility] Cardiac and Pulmonary Rehab   Date  10/24/15   Educator  DW   Instruction Review Code  2- meets goals/outcomes      Falls Prevention: - Provides verbal and written material to individual with discussion of falls prevention and safety.      Cardiac Rehab from 01/03/2016 in [facility] Cardiac and Pulmonary Rehab   Date  10/24/15   Educator  DW   Instruction Review Code  2- meets goals/outcomes      Diabetes: - Individual verbal and written instruction to review signs/symptoms of diabetes, desired ranges of glucose level fasting, after meals and with exercise. Advice that pre and post exercise glucose checks will be done for 3 sessions at entry of program.      Cardiac Rehab from 01/03/2016 in [facility] Cardiac and Pulmonary Rehab   Date  12/13/15   Educator  [place]   Instruction Review Code  2- meets goals/outcomes       Knowledge Questionnaire Score:     Knowledge Questionnaire Score - 11/08/15 1041    Knowledge Questionnaire Score   Pre Score 18      Personal Goals and Risk Factors at Admission:     Personal Goals and Risk Factors at Admission - 10/24/15 1740    Personal Goals and Risk Factors on Admission    Weight Management Yes   Admit Weight 262 lb 4.8 oz (118.978 kg)   Goal Weight 200 lb (90.719  kg)   Increase Aerobic Exercise and Physical Activity Yes   Intervention While in program, learn and follow the exercise prescription taught. Start at a low level workload and increase workload after able to maintain previous level for 30 minutes. Increase time before increasing intensity.   Take Less Medication Yes   Intervention Learn your risk factors and begin the lifestyle modifications for  risk factor control during your time in the program.   Understand more about Heart/Pulmonary Disease. Yes   Intervention While in program utilize professionals for any questions, and attend the education sessions. Great websites to use are www.americanheart.org or www.lung.org for reliable information.   Diabetes Yes   Goal Blood glucose control identified by blood glucose values, HgbA1C. Participant verbalizes understanding of the signs/symptoms of hyper/hypo glycemia, proper foot care and importance of medication and nutrition plan for blood glucose control.   Intervention Provide nutrition & aerobic exercise along with prescribed medications to achieve blood glucose in normal ranges: Fasting 65-99 mg/dL   Hypertension Yes   Goal Participant will see blood pressure controlled within the values of 140/71m/Hg or within value directed by their physician.   Intervention Provide nutrition & aerobic exercise along with prescribed medications to achieve BP 140/90 or less.   Lipids Yes   Goal Cholesterol controlled with medications as prescribed, with individualized exercise RX and with personalized nutrition plan. Value goals: LDL < 779m HDL > 4059mParticipant states understanding of desired cholesterol values and following prescriptions.   Intervention Provide nutrition & aerobic exercise along with prescribed medications to achieve LDL <71m75mDL >40mg39mStress Yes   Goal To meet with psychosocial counselor for stress and relaxation information and guidance. To state understanding of performing relaxation  techniques and or identifying personal stressors.   Intervention Provide education on types of stress, identifiying stressors, and ways to cope with stress. Provide demonstration and active practice of relaxation techniques.      Personal Goals and Risk Factors Review:      Goals and Risk Factor Review      11/15/15 0941 12/06/15 1005 12/13/15 1150 12/25/15 1058     Weight Management   Goals Progress/Improvement seen  Yes Yes No    Comments  He hasn't seen significant gain in scale numbers, however, has made very important diet changes  Cut down on sodium and has started to consume more soup and fruits and veggies.   Ken sYvone Neu today he has lost weight but not as quick as he would like. We discussed trying to cut back on his bread intake.  Ken sYvone Neu he is still struggling with his weight at times but doesn't feel he eats alot. He said he didn't need to meet with the dietician since he has in the past.    Increase Aerobic Exercise and Physical Activity   Goals Progress/Improvement seen  Yes Yes Yes     Comments Ken iYvone Neuelatively new to class, but is enjoying getting into the exercise portion of the program, and feels better each time he has completed exercise. Ken iYvone Neueeling much stronger.  Aiming to return to prior work levels, which he feels he is doing incrementally and is enjoying striving to make these gains.  Also has been doing some exercise on days that he is able. Ken aYvone NeuI discussed today and increasing his exercise will burn more calories and hopefully achieve or get closer to his weight loss goal.      Diabetes   Goal  Blood glucose control identified by blood glucose values, HgbA1C. Participant verbalizes understanding of the signs/symptoms of hyper/hypo glycemia, proper foot care and importance of medication and nutrition plan for blood glucose control.      Progress seen towards goals   Yes Yes    Comments   Ken rYvone Neurts his blood sugars have been stable.  Ken rYvone Neurts his MD recently  checked  his blood sugar and his cholestrol levels    Hypertension   Goal  Participant will see blood pressure controlled within the values of 140/49m/Hg or within value directed by their physician.  BP is remaining within acceptable levels.      Progress seen toward goals   Yes Yes    Comments   KYvone Neuhad a good blood pressure today but frequent PVC's when he was at Level 7 on the Nustep T5 and watts said 100. He is going to take a copy of his PVC with no symptoms to his primary care MD today since he lives near the office.  Ken's blood pressure has been stable.. See Cardiac Reha LSI sheets.     Abnormal Lipids   Progress seen towards goals   Unknown Yes    Comments   KYvone Neusaid his primary care MD takes care of this.  Blood work will be checked by his primary care.       Personal Goals Discharge (Final Personal Goals and Risk Factors Review):      Goals and Risk Factor Review - 12/25/15 1058    Weight Management   Goals Progress/Improvement seen No   Comments KYvone Neusaid he is still struggling with his weight at times but doesn't feel he eats alot. He said he didn't need to meet with the dietician since he has in the past.   Diabetes   Progress seen towards goals Yes   Comments KYvone Neureports his MD recently checked his blood sugar and his cholestrol levels   Hypertension   Progress seen toward goals Yes   Comments Ken's blood pressure has been stable.. See Cardiac Reha LSI sheets.    Abnormal Lipids   Progress seen towards goals Yes   Comments Blood work will be checked by his primary care.      ITP Comments:     ITP Comments      11/18/15 1302 12/13/15 1242 12/25/15 1056 01/08/16 0846     ITP Comments Ready for 30 day review.  Continue with ITP   New start to program, has attended three sessions. Ready for 30 day review. Continue with ITP. Discussed with [place]vone Neuabout fat in [place] fat in [place]. KYvone Neusays he [facility] and I suggested he  ask for there calorie nutrition guide (since KYvone Neudoesn't like to look up things on the computer).  30 Day Review.  Continue with ITP.       Comments:

## 2016-01-10 DIAGNOSIS — I213 ST elevation (STEMI) myocardial infarction of unspecified site: Secondary | ICD-10-CM | POA: Diagnosis not present

## 2016-01-10 DIAGNOSIS — Z9861 Coronary angioplasty status: Secondary | ICD-10-CM

## 2016-01-10 NOTE — Progress Notes (Signed)
Daily Session Note  Patient Details  Name: James [name] MRN: [id number] Date of Birth: Oct 26, 1946 Referring Provider:  Isaias Cowman, MD  Encounter Date: 01/10/2016  Check In:     Session Check In - 01/10/16 0903    Check-In   Location [facility]   Staff Present Gerlene Burdock, RN, BSN;Tzivia Oneil, BS, ACSM EP-C, Exercise Physiologist;Diane Joya Gaskins, RN, BSN   Supervising physician immediately available to respond to emergencies See telemetry face sheet for immediately available ER MD   Medication changes reported     No   Fall or balance concerns reported    No   Warm-up and Cool-down Performed on first and last piece of equipment   Resistance Training Performed No   VAD Patient? No   Pain Assessment   Currently in Pain? No/denies         Goals Met:  Proper associated with RPD/PD & O2 Sat Exercise tolerated well No report of cardiac concerns or symptoms Strength training completed today  Goals Unmet:  Not Applicable  Goals Comments:   Dr. Emily Filbert is Medical Director for [facility].

## 2016-01-15 ENCOUNTER — Encounter: Payer: Medicare Other | Admitting: *Deleted

## 2016-01-15 DIAGNOSIS — I213 ST elevation (STEMI) myocardial infarction of unspecified site: Secondary | ICD-10-CM

## 2016-01-15 DIAGNOSIS — Z9861 Coronary angioplasty status: Secondary | ICD-10-CM

## 2016-01-15 NOTE — Progress Notes (Signed)
Daily Session Note  Patient Details  Name: James [name] MRN: [id number] Date of Birth: 22-Jun-1946 Referring Provider:  Isaias Cowman, MD  Encounter Date: 01/15/2016  Check In:     Session Check In - 01/15/16 0851    Check-In   Location [facility]   Staff Present Jeanell Sparrow, PT;Loza Prell Joya Gaskins, RN, BSN;Mary Kellie Shropshire, RN   Supervising physician immediately available to respond to emergencies See telemetry face sheet for immediately available ER MD   Medication changes reported     No   Fall or balance concerns reported    No   Warm-up and Cool-down Performed on first and last piece of equipment   Resistance Training Performed Yes   VAD Patient? No   Pain Assessment   Currently in Pain? No/denies         Goals Met:  Independence with exercise equipment Exercise tolerated well No report of cardiac concerns or symptoms Strength training completed today  Goals Unmet:  Not Applicable  Comments:   Patient completed exercise prescription and all exercise goals during rehab session. The exercise was tolerated well and the patient is progressing in the program.   Dr. Emily Filbert is Medical Director for [facility].

## 2016-01-16 NOTE — Addendum Note (Signed)
Addended by: Rudy Jew on: 01/16/2016 07:35 AM   Modules accepted: Orders

## 2016-01-17 ENCOUNTER — Encounter: Payer: Medicare Other | Attending: Cardiology

## 2016-01-17 DIAGNOSIS — I213 ST elevation (STEMI) myocardial infarction of unspecified site: Secondary | ICD-10-CM | POA: Diagnosis not present

## 2016-01-17 DIAGNOSIS — Z9861 Coronary angioplasty status: Secondary | ICD-10-CM | POA: Insufficient documentation

## 2016-01-17 NOTE — Progress Notes (Signed)
Daily Session Note  Patient Details  Name: James [name] MRN: [id number] Date of Birth: 29-Sep-1946 Referring Provider:  Isaias Cowman, MD  Encounter Date: 01/17/2016  Check In:     Session Check In - 01/17/16 0851    Check-In   Location [facility]   Staff Present Gerlene Burdock, RN, BSN;Celeste Tavenner, BS, ACSM EP-C, Exercise Physiologist;Rebecca Brayton El, PT   Supervising physician immediately available to respond to emergencies See telemetry face sheet for immediately available ER MD   Medication changes reported     No   Fall or balance concerns reported    No   Warm-up and Cool-down Performed on first and last piece of equipment   Resistance Training Performed No   VAD Patient? No   Pain Assessment   Currently in Pain? No/denies         Goals Met:  Proper associated with RPD/PD & O2 Sat Exercise tolerated well No report of cardiac concerns or symptoms Strength training completed today  Goals Unmet:  Not Applicable  Comments:    Dr. Emily Filbert is Medical Director for [place] [facility].

## 2016-01-18 ENCOUNTER — Other Ambulatory Visit: Payer: Self-pay

## 2016-01-18 DIAGNOSIS — N529 Male erectile dysfunction, unspecified: Secondary | ICD-10-CM

## 2016-01-18 MED ORDER — SILDENAFIL CITRATE 20 MG PO TABS: 20.0000 mg | ORAL_TABLET | Freq: Three times a day (TID) | ORAL | Status: DC

## 2016-01-22 ENCOUNTER — Encounter: Payer: Medicare Other | Admitting: *Deleted

## 2016-01-22 VITALS — Ht 70.5 in | Wt 253.0 lb

## 2016-01-22 DIAGNOSIS — I213 ST elevation (STEMI) myocardial infarction of unspecified site: Secondary | ICD-10-CM | POA: Diagnosis not present

## 2016-01-22 DIAGNOSIS — Z9861 Coronary angioplasty status: Secondary | ICD-10-CM

## 2016-01-22 NOTE — Progress Notes (Signed)
Daily Session Note  Patient Details  Name: Ghazi Rumpf MRN: [id number] Date of Birth: [birth year] Referring Provider:  Isaias Cowman, MD  Encounter Date: 01/22/2016  Check In:     Session Check In - 01/22/16 0850    Check-In   Location [facility]   Staff Present Candiss Norse, MS, ACSM CEP, Exercise Physiologist;Susanne Bice, RN, BSN, CCRP;Rebecca Sickles, PT   Supervising physician immediately available to respond to emergencies See telemetry face sheet for immediately available ER MD   Medication changes reported     No   Fall or balance concerns reported    No   Warm-up and Cool-down Performed on first and last piece of equipment   Resistance Training Performed Yes   VAD Patient? No   Pain Assessment   Currently in Pain? No/denies   Multiple Pain Sites No         Goals Met:  Independence with exercise equipment Exercise tolerated well No report of cardiac concerns or symptoms Strength training completed today  Goals Unmet:  Not Applicable  Comments: Patient completed exercise prescription and all exercise goals during rehab session. The exercise was tolerated well and the patient is progressing in the program.    Dr. Emily Filbert is Medical Director for [facility].

## 2016-02-07 NOTE — Patient Instructions (Signed)
Discharge Instructions  Patient Details  Name: James [name] MRN: [id number] Date of Birth: Mar 05, 1946 Referring Provider:  Marcina Millard, MD   Number of Visits: 21  Reason for Discharge:  Patient reached a stable level of exercise. Patient independent in their exercise.  Smoking History:  History  Smoking status  . Former Smoker -- 1.50 packs/day for 25 years  . Quit date: 11/17/1984  Smokeless tobacco  . Never Used    Comment: "years ago"    Diagnosis:  S/P PTCA (percutaneous transluminal coronary angioplasty)  Initial Exercise Prescription:     Initial Exercise Prescription - 10/24/15 1400    Date of Initial Exercise Prescription   Date 10/24/15   Treadmill   MPH 2   Grade 0   Minutes 10   Bike   Level 0.2   Minutes 10   Recumbant Bike   Level 3   RPM 40   Watts 25   Minutes 15   NuStep   Level 3   Watts 30   Minutes 15   Arm Ergometer   Level 1   Watts 8   Minutes 10   Arm/Foot Ergometer   Level 4   Watts 15   Minutes 10   Cybex   Level 2   RPM 50   Minutes 10   Recumbant Elliptical   Level 1   RPM 40   Watts 10   Minutes 10   Elliptical   Level 1   Speed 3   Minutes 1   REL-XR   Level 2   Watts 30   Minutes 15   T5 Nustep   Level 2   Watts 15   Minutes 15   Biostep-RELP   Level 2   Watts 15   Minutes 15   Prescription Details   Frequency (times per week) 3   Duration Progress to 30 minutes of continuous aerobic without signs/symptoms of physical distress   Intensity   THRR REST +  30   Ratings of Perceived Exertion 11-15   Progression   Progression Continue progressive overload as per policy without signs/symptoms or physical distress.   Resistance Training   Training Prescription Yes   Weight 2   Reps 10-15      Discharge Exercise Prescription (Final Exercise Prescription Changes):     Exercise Prescription Changes - 01/22/16 0900    Exercise Review   Progression Yes   Response to Exercise   Symptoms  None   Comments Patient is graduating today and home exercise plans were discussed. Details of the patient's exercise prescription and what they need to do in order to continue the prescription and progress with exercise were outlined and the patient verbalized understanding. The patient plans to complete all exercise at [facility] where he was previously a member.    Frequency Add 2 additional days to program exercise sessions.  Rocky Link is going to start light exercise at [employer]   Duration Progress to 50 minutes of aerobic without signs/symptoms of physical distress   Intensity Rest + 30   Progression   Progression Continue progressive overload as per policy without signs/symptoms or physical distress.   Resistance Training   Training Prescription Yes   Weight 4   Reps 10-15   Interval Training   Interval Training Yes   Equipment NuStep   Comments T5 level 6-7 at 60-70 watts   Treadmill   MPH 3.2   Grade 0   Minutes 20   T5  Nustep   Level 7   Watts 100   Minutes 40   Home Exercise Plan   [facility]  [employer]      Functional Capacity:     6 Minute Walk      10/24/15 1436 01/22/16 0934     6 Minute Walk   Phase Initial Discharge    Distance 1300 feet 1650 feet    Distance % Change  27 %    Walk Time 6 minutes 6 minutes    # of Rest Breaks  0    RPE 8 9    Symptoms No No    Resting HR 72 bpm 70 bpm    Resting BP 124/60 mmHg 128/68 mmHg    Max Ex. HR 111 bpm 98 bpm    Max Ex. BP 144/62 mmHg 136/62 mmHg       Quality of Life:     Quality of Life - 10/24/15 1743    Quality of Life Scores   Health/Function Pre 22.32 %   Socioeconomic Pre 30 %   Psych/Spiritual Pre 27.43 %   Family Pre 28.8 %   GLOBAL Pre 26.02 %      Personal Goals: Goals established at orientation with interventions provided to work toward goal.     Personal Goals and Risk Factors at Admission - 10/24/15 1740     Core Components/Risk Factors/Patient Goals on Admission    Weight Management Yes   Admit Weight 262 lb 4.8 oz (118.978 kg)   Goal Weight: Short Term 200 lb (90.719 kg)   Sedentary Yes   Intervention (read-only) While in program, learn and follow the exercise prescription taught. Start at a low level workload and increase workload after able to maintain previous level for 30 minutes. Increase time before increasing intensity.   Diabetes Yes   Goal Blood glucose control identified by blood glucose values, HgbA1C. Participant verbalizes understanding of the signs/symptoms of hyper/hypo glycemia, proper foot care and importance of medication and nutrition plan for blood glucose control.   Intervention (read-only) Provide nutrition & aerobic exercise along with prescribed medications to achieve blood glucose in normal ranges: Fasting 65-99 mg/dL   Hypertension Yes   Goal Participant will see blood pressure controlled within the values of 140/3890mm/Hg or within value directed by their physician.   Intervention (read-only) Provide nutrition & aerobic exercise along with prescribed medications to achieve BP 140/90 or less.   Lipids Yes   Goal Cholesterol controlled with medications as prescribed, with individualized exercise RX and with personalized nutrition plan. Value goals: LDL < 70mg , HDL > 40mg . Participant states understanding of desired cholesterol values and following prescriptions.   Intervention (read-only) Provide nutrition & aerobic exercise along with prescribed medications to achieve LDL 70mg , HDL >40mg .   Stress Yes   Goal To meet with psychosocial counselor for stress and relaxation information and guidance. To state understanding of performing relaxation techniques and or identifying personal stressors.   Intervention (read-only) Provide education on types of stress, identifiying stressors, and ways to cope with stress. Provide demonstration and active practice of relaxation techniques.    Take Less Medication Yes   Intervention Learn your risk factors and begin the lifestyle modifications for risk factor control during your time in the program.   Understand more about Heart/Pulmonary Disease. Yes   Intervention While in program utilize professionals for any questions, and attend the education sessions. Great websites to use are www.americanheart.org or www.lung.org  for reliable information.       Personal Goals Discharge:     Goals and Risk Factor Review - 12/25/15 1058    Weight Management (read-only)   Goals Progress/Improvement seen No   Comments Rocky Link said he is still struggling with his weight at times but doesn't feel he eats alot. He said he didn't need to meet with the dietician since he has in the past.   Diabetes (read-only)   Progress seen towards goals Yes   Comments Rocky Link reports his MD recently checked his blood sugar and his cholestrol levels   Hypertension (read-only)   Progress seen toward goals Yes   Comments Ken's blood pressure has been stable.. See Cardiac Reha LSI sheets.    Abnormal Lipids (read-only)   Progress seen towards goals Yes   Comments Blood work will be checked by his primary care.      Nutrition & Weight - Outcomes:     Pre Biometrics - 10/24/15 1436    Pre Biometrics   Height 5' 10.5" (1.791 m)   Weight 262 lb 4.8 oz (118.978 kg)   Waist Circumference 47.75 inches   Hip Circumference 49 inches   Waist to Hip Ratio 0.97 %   BMI (Calculated) 37.2         Post Biometrics - 01/22/16 0934     Post  Biometrics   Height 5' 10.5" (1.791 m)   Weight 253 lb (114.76 kg)   Waist Circumference 46.5 inches   Hip Circumference 45.5 inches   Waist to Hip Ratio 1.02 %   BMI (Calculated) 35.9      Nutrition:     Nutrition Therapy & Goals - 12/04/15 1237    Nutrition Therapy   Diet Instructed patient on a meal plan based on 1800 calories including DASH diet principles   Drug/Food Interactions Statins/Certain Fruits   Protein (oz)  (read-only) 8 ounces/day   Fiber 30 grams   Whole Grain Foods 3 servings   Saturated Fats 12 max. grams   Fruits and Vegetables 5 servings/day   Personal Nutrition Goals   Personal Goal #1 Try [employer] "very low sodium" tuna   Personal Goal #2 Increase intake of whole grains. Try 100% whole wheat pasta and brown rice   Personal Goal #3 Increase fruits and vegetables with goal of 5 servings per day.   Personal Goal #4 Try the mini bags of popcorn verses the large bags   Additional Goals? Yes   Personal Goal #5 Consider a margarine with no trans fat. Refer to list.      Nutrition Discharge:     Nutrition Assessments - 11/15/15 0924    Rate Your Plate Scores   Pre Score 58   Pre Score % 64 %      Education Questionnaire Score:     Knowledge Questionnaire Score - 11/08/15 1041    Knowledge Questionnaire Score   Pre Score 18     Ken, Not all the questionnaires we needed to complete your post scores were in the packet given to you.  Not all post scores are on this form.   Please continue with your exercise and following the nutrition plan provided.   Thank you for attending the program.

## 2016-02-07 NOTE — Progress Notes (Signed)
Discharge Summary  Patient Details  Name: James [name] MRN: [id number] Date of Birth: 1945/12/21 Referring Provider:  Marcina Millard, MD   Number of Visits: 85  Reason for Discharge:  Patient reached a stable level of exercise. Patient independent in their exercise.  Smoking History:  History  Smoking status  . Former Smoker -- 1.50 packs/day for 25 years  . Quit date: 11/17/1984  Smokeless tobacco  . Never Used    Comment: "years ago"    Diagnosis:  S/P PTCA (percutaneous transluminal coronary angioplasty)  ADL UCSD:   Initial Exercise Prescription:     Initial Exercise Prescription - 10/24/15 1400    Date of Initial Exercise Prescription   Date 10/24/15   Treadmill   MPH 2   Grade 0   Minutes 10   Bike   Level 0.2   Minutes 10   Recumbant Bike   Level 3   RPM 40   Watts 25   Minutes 15   NuStep   Level 3   Watts 30   Minutes 15   Arm Ergometer   Level 1   Watts 8   Minutes 10   Arm/Foot Ergometer   Level 4   Watts 15   Minutes 10   Cybex   Level 2   RPM 50   Minutes 10   Recumbant Elliptical   Level 1   RPM 40   Watts 10   Minutes 10   Elliptical   Level 1   Speed 3   Minutes 1   REL-XR   Level 2   Watts 30   Minutes 15   T5 Nustep   Level 2   Watts 15   Minutes 15   Biostep-RELP   Level 2   Watts 15   Minutes 15   Prescription Details   Frequency (times per week) 3   Duration Progress to 30 minutes of continuous aerobic without signs/symptoms of physical distress   Intensity   THRR REST +  30   Ratings of Perceived Exertion 11-15   Progression   Progression Continue progressive overload as per policy without signs/symptoms or physical distress.   Resistance Training   Training Prescription Yes   Weight 2   Reps 10-15      Discharge Exercise Prescription (Final Exercise Prescription Changes):     Exercise Prescription Changes - 01/22/16 0900    Exercise Review   Progression Yes   Response to Exercise   Symptoms None   Comments Patient is graduating today and home exercise plans were discussed. Details of the patient's exercise prescription and what they need to do in order to continue the prescription and progress with exercise were outlined and the patient verbalized understanding. The patient plans to complete all exercise at [facility] where he was previously a member.    Frequency Add 2 additional days to program exercise sessions.  Rocky Link is going to start light exercise at [employer]   Duration Progress to 50 minutes of aerobic without signs/symptoms of physical distress   Intensity Rest + 30   Progression   Progression Continue progressive overload as per policy without signs/symptoms or physical distress.   Resistance Training   Training Prescription Yes   Weight 4   Reps 10-15   Interval Training   Interval Training Yes   Equipment NuStep   Comments T5 level 6-7 at 60-70 watts   Treadmill   MPH 3.2   Grade 0   Minutes 20  T5 Nustep   Level 7   Watts 100   Minutes 40   Home Exercise Plan   [facility]  [employer]      Functional Capacity:     6 Minute Walk      10/24/15 1436 01/22/16 0934     6 Minute Walk   Phase Initial Discharge    Distance 1300 feet 1650 feet    Distance % Change  27 %    Walk Time 6 minutes 6 minutes    # of Rest Breaks  0    RPE 8 9    Symptoms No No    Resting HR 72 bpm 70 bpm    Resting BP 124/60 mmHg 128/68 mmHg    Max Ex. HR 111 bpm 98 bpm    Max Ex. BP 144/62 mmHg 136/62 mmHg       Psychological, QOL, Others - Outcomes: PHQ 2/9: Depression screen PHQ 2/9 10/24/2015  Decreased Interest 0  Down, Depressed, Hopeless 0  PHQ - 2 Score 0  Altered sleeping 1  Tired, decreased energy 1  Change in appetite 0  Feeling bad or failure about yourself  0  Trouble concentrating 0  Moving slowly or fidgety/restless 0  Suicidal thoughts 0  PHQ-9 Score 2  Difficult  doing work/chores Not difficult at all    Quality of Life:     Quality of Life - 10/24/15 1743    Quality of Life Scores   Health/Function Pre 22.32 %   Socioeconomic Pre 30 %   Psych/Spiritual Pre 27.43 %   Family Pre 28.8 %   GLOBAL Pre 26.02 %      Personal Goals: Goals established at orientation with interventions provided to work toward goal.     Personal Goals and Risk Factors at Admission - 10/24/15 1740    Core Components/Risk Factors/Patient Goals on Admission    Weight Management Yes   Admit Weight 262 lb 4.8 oz (118.978 kg)   Goal Weight: Short Term 200 lb (90.719 kg)   Sedentary Yes   Intervention (read-only) While in program, learn and follow the exercise prescription taught. Start at a low level workload and increase workload after able to maintain previous level for 30 minutes. Increase time before increasing intensity.   Diabetes Yes   Goal Blood glucose control identified by blood glucose values, HgbA1C. Participant verbalizes understanding of the signs/symptoms of hyper/hypo glycemia, proper foot care and importance of medication and nutrition plan for blood glucose control.   Intervention (read-only) Provide nutrition & aerobic exercise along with prescribed medications to achieve blood glucose in normal ranges: Fasting 65-99 mg/dL   Hypertension Yes   Goal Participant will see blood pressure controlled within the values of 140/6790mm/Hg or within value directed by their physician.   Intervention (read-only) Provide nutrition & aerobic exercise along with prescribed medications to achieve BP 140/90 or less.   Lipids Yes   Goal Cholesterol controlled with medications as prescribed, with individualized exercise RX and with personalized nutrition plan. Value goals: LDL < 70mg , HDL > 40mg . Participant states understanding of desired cholesterol values and following prescriptions.   Intervention (read-only) Provide nutrition & aerobic exercise along with prescribed  medications to achieve LDL 70mg , HDL >40mg .   Stress Yes   Goal To meet with psychosocial counselor for stress and relaxation information and guidance. To state understanding of performing relaxation techniques and or identifying personal stressors.   Intervention (read-only) Provide education  on types of stress, identifiying stressors, and ways to cope with stress. Provide demonstration and active practice of relaxation techniques.   Take Less Medication Yes   Intervention Learn your risk factors and begin the lifestyle modifications for risk factor control during your time in the program.   Understand more about Heart/Pulmonary Disease. Yes   Intervention While in program utilize professionals for any questions, and attend the education sessions. Great websites to use are www.americanheart.org or www.lung.org for reliable information.       Personal Goals Discharge:     Goals and Risk Factor Review      11/15/15 0941 12/06/15 1005 12/13/15 1150 12/25/15 1058     Core Components/Risk Factors/Patient Goals Review   Personal Goals Review Increase Aerobic Exercise and Physical Activity Weight Management Diabetes;Hypertension;Lipids     Weight Management (read-only)   Goals Progress/Improvement seen  Yes Yes No    Comments  He hasn't seen significant gain in scale numbers, however, has made very important diet changes  Cut down on sodium and has started to consume more soup and fruits and veggies.   Rocky Link siad today he has lost weight but not as quick as he would like. We discussed trying to cut back on his bread intake.  Rocky Link said he is still struggling with his weight at times but doesn't feel he eats alot. He said he didn't need to meet with the dietician since he has in the past.    Increase Aerobic Exercise and Physical Activity (read-only)   Goals Progress/Improvement seen  Yes Yes Yes     Comments Rocky Link is relatively new to class, but is enjoying getting into the exercise portion of the  program, and feels better each time he has completed exercise. Rocky Link is feeling much stronger.  Aiming to return to prior work levels, which he feels he is doing incrementally and is enjoying striving to make these gains.  Also has been doing some exercise on days that he is able. Rocky Link and I discussed today and increasing his exercise will burn more calories and hopefully achieve or get closer to his weight loss goal.      Diabetes (read-only)   Goal  Blood glucose control identified by blood glucose values, HgbA1C. Participant verbalizes understanding of the signs/symptoms of hyper/hypo glycemia, proper foot care and importance of medication and nutrition plan for blood glucose control.      Progress seen towards goals   Yes Yes    Comments   Rocky Link reports his blood sugars have been stable.  Rocky Link reports his MD recently checked his blood sugar and his cholestrol levels    Hypertension (read-only)   Goal  Participant will see blood pressure controlled within the values of 140/54mm/Hg or within value directed by their physician.  BP is remaining within acceptable levels.      Progress seen toward goals   Yes Yes    Comments   Rocky Link had a good blood pressure today but frequent PVC's when he was at Level 7 on the Nustep T5 and watts said 100. He is going to take a copy of his PVC with no symptoms to his primary care MD today since he lives near the office.  Ken's blood pressure has been stable.. See Cardiac Reha LSI sheets.     Abnormal Lipids (read-only)   Progress seen towards goals   Unknown Yes    Comments   Rocky Link said his primary care MD takes care of this.  Blood work  will be checked by his primary care.       Nutrition & Weight - Outcomes:     Pre Biometrics - 10/24/15 1436    Pre Biometrics   Height 5' 10.5" (1.791 m)   Weight 262 lb 4.8 oz (118.978 kg)   Waist Circumference 47.75 inches   Hip Circumference 49 inches   Waist to Hip Ratio 0.97 %   BMI (Calculated) 37.2         Post  Biometrics - 01/22/16 0934     Post  Biometrics   Height 5' 10.5" (1.791 m)   Weight 253 lb (114.76 kg)   Waist Circumference 46.5 inches   Hip Circumference 45.5 inches   Waist to Hip Ratio 1.02 %   BMI (Calculated) 35.9      Nutrition:     Nutrition Therapy & Goals - 12/04/15 1237    Nutrition Therapy   Diet Instructed patient on a meal plan based on 1800 calories including DASH diet principles   Drug/Food Interactions Statins/Certain Fruits   Protein (oz) (read-only) 8 ounces/day   Fiber 30 grams   Whole Grain Foods 3 servings   Saturated Fats 12 max. grams   Fruits and Vegetables 5 servings/day   Personal Nutrition Goals   Personal Goal #1 Try [employer] "very low sodium" tuna   Personal Goal #2 Increase intake of whole grains. Try 100% whole wheat pasta and brown rice   Personal Goal #3 Increase fruits and vegetables with goal of 5 servings per day.   Personal Goal #4 Try the mini bags of popcorn verses the large bags   Additional Goals? Yes   Personal Goal #5 Consider a margarine with no trans fat. Refer to list.      Nutrition Discharge:     Nutrition Assessments - 11/15/15 0924    Rate Your Plate Scores   Pre Score 58   Pre Score % 64 %      Education Questionnaire Score:     Knowledge Questionnaire Score - 11/08/15 1041    Knowledge Questionnaire Score   Pre Score 18      Goals reviewed with patient; copy given to patient.

## 2016-02-07 NOTE — Progress Notes (Signed)
Cardiac Individual Treatment Plan  Patient Details  Name: James [name] MRN: [id number] Date of Birth: 02-23-1946 Referring Provider:  Isaias Cowman, MD  Initial Encounter Date:       Cardiac Rehab from 10/24/2015 in [facility] Cardiac and Pulmonary Rehab   Date  10/24/15      Visit Diagnosis: S/P PTCA (percutaneous transluminal coronary angioplasty)  Patient's Home Medications on Admission:  Current outpatient prescriptions:  .  aspirin 81 MG tablet, Take 81 mg by mouth daily., Disp: , Rfl:  .  atorvastatin (LIPITOR) 80 MG tablet, Take 1 tablet by mouth., Disp: , Rfl:  .  cyanocobalamin (,VITAMIN B-12,) 1000 MCG/ML injection, Inject into the muscle., Disp: , Rfl:  .  Cyanocobalamin (RA VITAMIN B-12 TR) 1000 MCG TBCR, Take by mouth daily. , Disp: , Rfl:  .  finasteride (PROSCAR) 5 MG tablet, Take by mouth., Disp: , Rfl:  .  metoprolol succinate (TOPROL-XL) 50 MG 24 hr tablet, Take by mouth., Disp: , Rfl:  .  ramipril (ALTACE) 10 MG capsule, Take 10 mg by mouth daily., Disp: , Rfl:  .  sildenafil (REVATIO) 20 MG tablet, Take 1 tablet (20 mg total) by mouth 3 (three) times daily., Disp: 90 tablet, Rfl: 3 .  tamsulosin (FLOMAX) 0.4 MG CAPS capsule, Take 1 capsule (0.4 mg total) by mouth daily after supper., Disp: 90 capsule, Rfl: 3 .  ticagrelor (BRILINTA) 90 MG TABS tablet, Take by mouth., Disp: , Rfl:  .  Vitamin D, Cholecalciferol, 1000 UNITS CAPS, Take by mouth daily., Disp: , Rfl:   Past Medical History: Past Medical History  Diagnosis Date  . Stroke ([place])   . Erectile dysfunction   . BPH (benign prostatic hyperplasia)   . Erectile dysfunction   . Hypogonadism in male     Tobacco Use: History  Smoking status  . Former Smoker -- 1.50 packs/day for 25 years  . Quit date: 11/17/1984  Smokeless tobacco  . Never Used    Comment: "years ago"    Labs: Recent Review Flowsheet Data    There is no flowsheet data to display.       Exercise Target Goals:    Exercise  Program Goal: Individual exercise prescription set with THRR, safety & activity barriers. Participant demonstrates ability to understand and report RPE using BORG scale, to self-measure pulse accurately, and to acknowledge the importance of the exercise prescription.  Exercise Prescription Goal: Starting with aerobic activity 30 plus minutes a day, 3 days per week for initial exercise prescription. Provide home exercise prescription and guidelines that participant acknowledges understanding prior to discharge.  Activity Barriers & Risk Stratification:     Activity Barriers & Cardiac Risk Stratification - 10/24/15 1730    Activity Barriers & Cardiac Risk Stratification   Activity Barriers None   Cardiac Risk Stratification High      6 Minute Walk:     6 Minute Walk      10/24/15 1436 01/22/16 0934     6 Minute Walk   Phase Initial Discharge    Distance 1300 feet 1650 feet    Distance % Change  27 %    Walk Time 6 minutes 6 minutes    # of Rest Breaks  0    RPE 8 9    Symptoms No No    Resting HR 72 bpm 70 bpm    Resting BP 124/60 mmHg 128/68 mmHg    Max Ex. HR 111 bpm 98 bpm    Max Ex. BP 144/62  mmHg 136/62 mmHg       Initial Exercise Prescription:     Initial Exercise Prescription - 10/24/15 1400    Date of Initial Exercise Prescription   Date 10/24/15   Treadmill   MPH 2   Grade 0   Minutes 10   Bike   Level 0.2   Minutes 10   Recumbant Bike   Level 3   RPM 40   Watts 25   Minutes 15   NuStep   Level 3   Watts 30   Minutes 15   Arm Ergometer   Level 1   Watts 8   Minutes 10   Arm/Foot Ergometer   Level 4   Watts 15   Minutes 10   Cybex   Level 2   RPM 50   Minutes 10   Recumbant Elliptical   Level 1   RPM 40   Watts 10   Minutes 10   Elliptical   Level 1   Speed 3   Minutes 1   REL-XR   Level 2   Watts 30   Minutes 15   T5 Nustep   Level 2   Watts 15   Minutes 15   Biostep-RELP   Level 2   Watts 15   Minutes 15    Prescription Details   Frequency (times per week) 3   Duration Progress to 30 minutes of continuous aerobic without signs/symptoms of physical distress   Intensity   THRR REST +  30   Ratings of Perceived Exertion 11-15   Progression   Progression Continue progressive overload as per policy without signs/symptoms or physical distress.   Resistance Training   Training Prescription Yes   Weight 2   Reps 10-15      Perform Capillary Blood Glucose checks as needed.  Exercise Prescription Changes:     Exercise Prescription Changes      11/15/15 1400 12/04/15 0656 12/20/15 0900 12/25/15 0900 01/03/16 0648   Exercise Review   Progression    Yes Yes   Response to Exercise   Blood Pressure (Admit) 128/78 mmHg 124/70 mmHg   122/64 mmHg   Blood Pressure (Exercise) 142/64 mmHg 162/64 mmHg   160/64 mmHg   Blood Pressure (Exit) 124/76 mmHg 100/64 mmHg   110/62 mmHg   Heart Rate (Admit) 99 bpm 70 bpm   61 bpm   Heart Rate (Exercise) 102 bpm 97 bpm   95 bpm   Heart Rate (Exit) 84 bpm 87 bpm   68 bpm   Rating of Perceived Exertion (Exercise) _0 Symptoms none none  None None   Comments    Reviewed individualized exercise prescription and made increases per departmental policy. Exercise increases were discussed with the patient and they were able to perform the new work loads without issue (no signs or symptoms).  James [name] can continuously exercise for the entire 45 minutes of aerobic exercise and exercise progression is now focused on intensity.Marland Kitchen James [name] has been doing interval training for the last several sessions and is very compliant with it. We will continue to offer tweaks in his interval program in order to promote continuous progression.    Frequency     Add 2 additional days to program exercise sessions.   Duration Progress to 50 minutes of aerobic without signs/symptoms of physical distress Progress to 50 minutes of aerobic without signs/symptoms of physical distress  Progress to 50  minutes of aerobic without signs/symptoms of physical  distress Progress to 50 minutes of aerobic without signs/symptoms of physical distress   Intensity THRR unchanged THRR unchanged  Rest + 30 Rest + 30   Progression   Progression Continue progressive overload as per policy without signs/symptoms or physical distress. Continue progressive overload as per policy without signs/symptoms or physical distress.  Continue progressive overload as per policy without signs/symptoms or physical distress. Continue progressive overload as per policy without signs/symptoms or physical distress.   Resistance Training   Training Prescription (read-only) _0    Weight (read-only) _1 Reps (read-only) 10-15 10-15 10-15 10-15 10-15   Interval Training   Interval Training No No Yes Yes Yes   Equipment   NuStep NuStep NuStep   Comments   T5 level 6-6 at 70 watts T5 level 6-7 at 60-70 watts T5 level 6-7 at 60-70 watts   Treadmill   MPH (read-only) 3.2 3.2 3.2 3.2 3.2   Grade (read-only) 0 0 0 0 0   Minutes (read-only) _2 T5 Nustep   Level (read-only) _3 Watts (read-only) _4 60 100   Minutes (read-only) _5 40     01/08/16 1000 01/22/16 0900         Exercise Review   Progression Yes Yes      Response to Exercise   Symptoms None None      Comments James [name] can continuously exercise for the entire 45 minutes of aerobic exercise and exercise progression is now focused on intensity.Marland Kitchen James [name] has been doing interval training for the last several sessions and is very compliant with it. We will continue to offer tweaks in his interval program in order to promote continuous progression.  Patient is graduating today and home exercise plans were discussed. Details of the patient's exercise prescription and what they need to do in order to continue the prescription and progress with exercise were outlined and the patient verbalized understanding. The patient plans to  complete all exercise at [facility] where he was previously a member.       Frequency Add 2 additional days to program exercise sessions.  James [name] is going to start light exercise at [place] 2 additional days to program exercise sessions.  James [name] is going to start light exercise at [employer]      Duration Progress to 50 minutes of aerobic without signs/symptoms of physical distress Progress to 50 minutes of aerobic without signs/symptoms of physical distress      Intensity Rest + 30 Rest + 30      Progression   Progression Continue progressive overload as per policy without signs/symptoms or physical distress. Continue progressive overload as per policy without signs/symptoms or physical distress.      Resistance Training   Training Prescription  Yes      Weight  4      Reps  10-15      Training Prescription (read-only) Yes       Weight (read-only) 4       Reps (read-only) 10-15       Interval Training   Interval Training Yes Yes      Equipment NuStep NuStep      Comments T5 level 6-7 at 60-70 watts T5 level 6-7 at 60-70 watts      Treadmill   MPH  3.2      Grade  0  Minutes  20      MPH (read-only) 3.2       Grade (read-only) 0       Minutes (read-only) 20       T5 Nustep   Level  7      Watts  100      Minutes  40      Level (read-only) 7       Watts (read-only) 100       Minutes (read-only) [place] to continue exercise at  [employer] (comment)  [employer]         Exercise Comments:   Discharge Exercise Prescription (Final Exercise Prescription Changes):     Exercise Prescription Changes - 01/22/16 0900    Exercise Review   Progression Yes   Response to Exercise   Symptoms None   Comments Patient is graduating today and home exercise plans were discussed. Details of the patient's exercise prescription and what they need to do in order to continue the prescription and progress with exercise were  outlined and the patient verbalized understanding. The patient plans to complete all exercise at [facility] where he was previously a member.    Frequency Add 2 additional days to program exercise sessions.  James [name] is going to start light exercise at [employer]   Duration Progress to 50 minutes of aerobic without signs/symptoms of physical distress   Intensity Rest + 30   Progression   Progression Continue progressive overload as per policy without signs/symptoms or physical distress.   Resistance Training   Training Prescription Yes   Weight 4   Reps 10-15   Interval Training   Interval Training Yes   Equipment NuStep   Comments T5 level 6-7 at 60-70 watts   Treadmill   MPH 3.2   Grade 0   Minutes 20   T5 Nustep   Level 7   Watts 100   Minutes 40   Home Exercise Plan   Plans to continue exercise at [employer] (comment)  [employer]      Nutrition:  Target Goals: Understanding of nutrition guidelines, daily intake of sodium <1511m, cholesterol <2084m calories 30% from fat and 7% or less from saturated fats, daily to have 5 or more servings of fruits and vegetables.  Biometrics:     Pre Biometrics - 10/24/15 1436    Pre Biometrics   Height 5' 10.5" (1.791 m)   Weight 262 lb 4.8 oz (118.978 kg)   Waist Circumference 47.75 inches   Hip Circumference 49 inches   Waist to Hip Ratio 0.97 %   BMI (Calculated) 37.2         Post Biometrics - 01/22/16 0934     Post  Biometrics   Height 5' 10.5" (1.791 m)   Weight 253 lb (114.76 kg)   Waist Circumference 46.5 inches   Hip Circumference 45.5 inches   Waist to Hip Ratio 1.02 %   BMI (Calculated) 35.9      Nutrition Therapy Plan and Nutrition Goals:     Nutrition Therapy & Goals - 12/04/15 1237    Nutrition Therapy   Diet Instructed patient on a meal plan based on 1800 calories including DASH diet principles   Drug/Food Interactions Statins/Certain Fruits   Protein (oz) (read-only) 8  ounces/day   Fiber 30 grams   Whole Grain Foods 3 servings   Saturated Fats 12 max. grams  Fruits and Vegetables 5 servings/day   Personal Nutrition Goals   Personal Goal #1 Try [employer] "very low sodium" tuna   Personal Goal #2 Increase intake of whole grains. Try 100% whole wheat pasta and brown rice   Personal Goal #3 Increase fruits and vegetables with goal of 5 servings per day.   Personal Goal #4 Try the mini bags of popcorn verses the large bags   Additional Goals? Yes   Personal Goal #5 Consider a margarine with no trans fat. Refer to list.      Nutrition Discharge: Rate Your Plate Scores:     Nutrition Assessments - 11/15/15 0924    Rate Your Plate Scores   Pre Score 58   Pre Score % 64 %      Nutrition Goals Re-Evaluation:     Nutrition Goals Re-Evaluation      12/13/15 1149 12/25/15 1057         Personal Goal #2 Re-Evaluation   Personal Goal #2  James [name] said he has tried 100% whole wheat.       Goal Progress Seen  Yes      Personal Goal #3 Re-Evaluation   Personal Goal #3 James [name] said he will try to cut back on the bread that he likes and substitute some vegetables. James [name] said it was very helpful when he met with the Cardiac Rehab Registered dietician       Goal Progress Seen Yes       Personal Goal #5 Re-Evaluation   Personal Goal #5  Discussed with James [name] about fat in [place] vs saturated fat in beef. James [name] says he eats out alot at [employer] like [employer] and I suggested he ask for there calorie nutrition guide (since James [name] doesn't like to look up things on the computer).       Goal Progress Seen  Yes         Psychosocial: Target Goals: Acknowledge presence or absence of depression, maximize coping skills, provide positive support system. Participant is able to verbalize types and ability to use techniques and skills needed for reducing stress and depression.  Initial Review & Psychosocial Screening:     Initial Psych Review & Screening - 10/24/15 [place]? Yes   Barriers   Psychosocial barriers to participate in program There are no identifiable barriers or psychosocial needs.   Screening Interventions   Interventions Encouraged to exercise  Pt. has a good support system.  He has his wife Golden Circle, 2 children and 3 grandchildren. He states he does not feel depressed.  He has an affiliation with a [employer].        Quality of Life Scores:     Quality of Life - 10/24/15 1743    Quality of Life Scores   Health/Function Pre 22.32 %   Socioeconomic Pre 30 %   Psych/Spiritual Pre 27.43 %   Family Pre 28.8 %   GLOBAL Pre 26.02 %      PHQ-9:     Recent Review Flowsheet Data    Depression screen [facility] 2/9 10/24/2015   Decreased Interest 0   Down, Depressed, Hopeless 0   PHQ - 2 Score 0   Altered sleeping 1   Tired, decreased energy 1   Change in appetite 0   Feeling bad or failure about yourself  0   Trouble concentrating 0   Moving slowly or fidgety/restless 0   Suicidal thoughts 0  PHQ-9 Score 2   Difficult doing work/chores Not difficult at all      Psychosocial Evaluation and Intervention:     Psychosocial Evaluation - 12/11/15 0951    Psychosocial Evaluation & Interventions   Interventions Encouraged to exercise with the program and follow exercise prescription;Stress management education;Relaxation education   Comments Counselor met with James [name] today for initial psychosocial evaluation.  He will be 70 years old next week and continues to work in [employer].  He had a heart attack on 11/3.  He has a strong support system with a spouse of 26 years and adult children who live close by as well as active involvement in his local church community.  James [name] states he has several other health issues including sleep apnea and erectile dysfunction for which he is being treated.  He denies a history of depression or anxiety or current symptoms and reports he is typically in a  positive mood.  He does mention how stressful at times his job is as well as concern over his health issues.   His goals for this program are to lose weight, increase his stamina and have a stronger heart.  he would also like to move around with greater ease or worry of having another heart attack.  James [name] works out at [employer] and plans to return there following this program.     Continued Psychosocial Services Needed Yes  James [name] will benefit form the stress management and relaxations components of this program.  He will also benefit form meeting with the dietician to address his weight loss goals.        Psychosocial Re-Evaluation:     Psychosocial Re-Evaluation      12/13/15 1154 12/25/15 1100         Psychosocial Re-Evaluation   Interventions Encouraged to attend Cardiac Rehabilitation for the exercise       Comments James [name] said he really wants to lose weight and is trying to exercise more. He realizes that he is only at session 8 or 10 and has 36 sessions of [facility] available to him.  James [name] said he feels things are going well but was hoping exercise would drop his weight more. He gets to eat out a lot with his wife he said.          Vocational Rehabilitation: Provide vocational rehab assistance to qualifying candidates.   Vocational Rehab Evaluation & Intervention:     Vocational Rehab - 10/24/15 1732    Initial Vocational Rehab Evaluation & Intervention   Assessment shows need for Vocational Rehabilitation No      Education: Education Goals: Education classes will be provided on a weekly basis, covering required topics. Participant will state understanding/return demonstration of topics presented.  Learning Barriers/Preferences:     Learning Barriers/Preferences - 10/24/15 1732    Learning Barriers/Preferences   Learning Barriers Sight;Hearing  Pt. had stroke 7 years ago which affected his  ability to process and respond to questions. Responses are delayed at  times.  Also pt has limited peripheral vision.  Patient does not drive at night.     Learning Preferences Written Material      Education Topics: General Nutrition Guidelines/Fats and Fiber: -Group instruction provided by verbal, written material, models and posters to present the general guidelines for heart healthy nutrition. Gives an explanation and review of dietary fats and fiber.          Cardiac Rehab from 01/22/2016 in [facility] Cardiac and  Pulmonary Rehab   Date  12/25/15   Educator  CR   Instruction Review Code  2- meets goals/outcomes      Controlling Sodium/Reading Food Labels: -Group verbal and written material supporting the discussion of sodium use in heart healthy nutrition. Review and explanation with models, verbal and written materials for utilization of the food label.      Cardiac Rehab from 01/22/2016 in [facility] Cardiac and Pulmonary Rehab   Date  01/01/16   Educator  PI   Instruction Review Code  2- meets goals/outcomes      Exercise Physiology & Risk Factors: - Group verbal and written instruction with models to review the exercise physiology of the cardiovascular system and associated critical values. Details cardiovascular disease risk factors and the goals associated with each risk factor.      Cardiac Rehab from 01/22/2016 in [facility] Cardiac and Pulmonary Rehab   Date  01/15/16   Instruction Review Code  2- meets goals/outcomes      Aerobic Exercise & Resistance Training: - Gives group verbal and written discussion on the health impact of inactivity. On the components of aerobic and resistive training programs and the benefits of this training and how to safely progress through these programs.      Cardiac Rehab from 01/22/2016 in [facility] Cardiac and Pulmonary Rehab   Date  01/17/16   Educator  BS   Instruction Review Code  2- meets goals/outcomes      Flexibility, Balance, General Exercise Guidelines: - Provides group verbal and written instruction on the benefits  of flexibility and balance training programs. Provides general exercise guidelines with specific guidelines to those with heart or lung disease. Demonstration and skill practice provided.      Cardiac Rehab from 01/22/2016 in [facility] Cardiac and Pulmonary Rehab   Date  01/22/16 Marcelina Morel attended 12/04/15]   Educator  RS   Instruction Review Code  2- meets goals/outcomes      Stress Management: - Provides group verbal and written instruction about the health risks of elevated stress, cause of high stress, and healthy ways to reduce stress.      Cardiac Rehab from 01/22/2016 in [facility] Cardiac and Pulmonary Rehab   Date  11/29/15   Educator  CE   Instruction Review Code  2- meets goals/outcomes      Depression: - Provides group verbal and written instruction on the correlation between heart/lung disease and depressed mood, treatment options, and the stigmas associated with seeking treatment.      Cardiac Rehab from 01/22/2016 in [facility] Cardiac and Pulmonary Rehab   Date  01/10/16   Educator  CE   Instruction Review Code  2- meets goals/outcomes      Anatomy & Physiology of the Heart: - Group verbal and written instruction and models provide basic cardiac anatomy and physiology, with the coronary electrical and arterial systems. Review of: AMI, Angina, Valve disease, Heart Failure, Cardiac Arrhythmia, Pacemakers, and the ICD.      Cardiac Rehab from 01/22/2016 in [facility] Cardiac and Pulmonary Rehab   Date  12/06/15   Educator  CE   Instruction Review Code  2- meets goals/outcomes      Cardiac Procedures: - Group verbal and written instruction and models to describe the testing methods done to diagnose heart disease. Reviews the outcomes of the test results. Describes the treatment choices: Medical Management, Angioplasty, or Coronary Bypass Surgery.      Cardiac Rehab from 01/22/2016 in [facility] Cardiac and Pulmonary Rehab  Date  01/03/16   Educator  CE   Instruction Review Code  2- meets goals/outcomes       Cardiac Medications: - Group verbal and written instruction to review commonly prescribed medications for heart disease. Reviews the medication, class of the drug, and side effects. Includes the steps to properly store meds and maintain the prescription regimen.      Cardiac Rehab from 01/22/2016 in [facility] Cardiac and Pulmonary Rehab   Date  12/27/15   Educator  DW   Instruction Review Code  2- meets goals/outcomes      Go Sex-Intimacy & Heart Disease, Get SMART - Goal Setting: - Group verbal and written instruction through game format to discuss heart disease and the return to sexual intimacy. Provides group verbal and written material to discuss and apply goal setting through the application of the S.M.A.R.T. Method.      Cardiac Rehab from 01/22/2016 in [facility] Cardiac and Pulmonary Rehab   Date  01/03/16   Educator  CE   Instruction Review Code  2- meets goals/outcomes      Other Matters of the Heart: - Provides group verbal, written materials and models to describe Heart Failure, Angina, Valve Disease, and Diabetes in the realm of heart disease. Includes description of the disease process and treatment options available to the cardiac patient.   Exercise & Equipment Safety: - Individual verbal instruction and demonstration of equipment use and safety with use of the equipment.      Cardiac Rehab from 01/22/2016 in [facility] Cardiac and Pulmonary Rehab   Date  10/24/15   Educator  DW   Instruction Review Code  1- partially meets, needs review/practice      Infection Prevention: - Provides verbal and written material to individual with discussion of infection control including proper hand washing and proper equipment cleaning during exercise session.      Cardiac Rehab from 01/22/2016 in [facility] Cardiac and Pulmonary Rehab   Date  10/24/15   Educator  DW   Instruction Review Code  2- meets goals/outcomes      Falls Prevention: - Provides verbal and written material to individual with  discussion of falls prevention and safety.      Cardiac Rehab from 01/22/2016 in [facility] Cardiac and Pulmonary Rehab   Date  10/24/15   Educator  DW   Instruction Review Code  2- meets goals/outcomes      Diabetes: - Individual verbal and written instruction to review signs/symptoms of diabetes, desired ranges of glucose level fasting, after meals and with exercise. Advice that pre and post exercise glucose checks will be done for 3 sessions at entry of program.      Cardiac Rehab from 01/22/2016 in [facility] Cardiac and Pulmonary Rehab   Date  12/13/15   Educator  [place]   Instruction Review Code  2- meets goals/outcomes       Knowledge Questionnaire Score:     Knowledge Questionnaire Score - 11/08/15 1041    Knowledge Questionnaire Score   Pre Score 18      Personal Goals and Risk Factors at Admission:     Personal Goals and Risk Factors at Admission - 10/24/15 1740    Core Components/Risk Factors/Patient Goals on Admission    Weight Management Yes   Admit Weight 262 lb 4.8 oz (118.978 kg)   Goal Weight: Short Term 200 lb (90.719 kg)   Sedentary Yes   Intervention (read-only) While in program, learn and follow the exercise prescription taught. Start at  a low level workload and increase workload after able to maintain previous level for 30 minutes. Increase time before increasing intensity.   Diabetes Yes   Goal Blood glucose control identified by blood glucose values, HgbA1C. Participant verbalizes understanding of the signs/symptoms of hyper/hypo glycemia, proper foot care and importance of medication and nutrition plan for blood glucose control.   Intervention (read-only) Provide nutrition & aerobic exercise along with prescribed medications to achieve blood glucose in normal ranges: Fasting 65-99 mg/dL   Hypertension Yes   Goal Participant will see blood pressure controlled within the values of 140/58m/Hg or within value directed by their physician.   Intervention (read-only)  Provide nutrition & aerobic exercise along with prescribed medications to achieve BP 140/90 or less.   Lipids Yes   Goal Cholesterol controlled with medications as prescribed, with individualized exercise RX and with personalized nutrition plan. Value goals: LDL < 788m HDL > 4023mParticipant states understanding of desired cholesterol values and following prescriptions.   Intervention (read-only) Provide nutrition & aerobic exercise along with prescribed medications to achieve LDL <36m52mDL >40mg61mStress Yes   Goal To meet with psychosocial counselor for stress and relaxation information and guidance. To state understanding of performing relaxation techniques and or identifying personal stressors.   Intervention (read-only) Provide education on types of stress, identifiying stressors, and ways to cope with stress. Provide demonstration and active practice of relaxation techniques.   Take Less Medication Yes   Intervention Learn your risk factors and begin the lifestyle modifications for risk factor control during your time in the program.   Understand more about Heart/Pulmonary Disease. Yes   Intervention While in program utilize professionals for any questions, and attend the education sessions. Great websites to use are www.americanheart.org or www.lung.org for reliable information.      Personal Goals and Risk Factors Review:      Goals and Risk Factor Review      11/15/15 0941 12/06/15 1005 12/13/15 1150 12/25/15 1058     Core Components/Risk Factors/Patient Goals Review   Personal Goals Review Increase Aerobic Exercise and Physical Activity Weight Management Diabetes;Hypertension;Lipids     Weight Management (read-only)   Goals Progress/Improvement seen  Yes Yes No    Comments  He hasn't seen significant gain in scale numbers, however, has made very important diet changes  Cut down on sodium and has started to consume more soup and fruits and veggies.   Ken sYvone [name] today he has lost  weight but not as quick as he would like. We discussed trying to cut back on his bread intake.  Ken sYvone [name] he is still struggling with his weight at times but doesn't feel he eats alot. He said he didn't need to meet with the dietician since he has in the past.    Increase Aerobic Exercise and Physical Activity (read-only)   Goals Progress/Improvement seen  Yes Yes Yes     Comments Ken iYvone Neuelatively new to class, but is enjoying getting into the exercise portion of the program, and feels better each time he has completed exercise. Ken iYvone Neueeling much stronger.  Aiming to return to prior work levels, which he feels he is doing incrementally and is enjoying striving to make these gains.  Also has been doing some exercise on days that he is able. Ken aYvone NeuI discussed today and increasing his exercise will burn more calories and hopefully achieve or get closer to his weight loss goal.      Diabetes (read-only)  Goal  Blood glucose control identified by blood glucose values, HgbA1C. Participant verbalizes understanding of the signs/symptoms of hyper/hypo glycemia, proper foot care and importance of medication and nutrition plan for blood glucose control.      Progress seen towards goals   Yes Yes    Comments   James [name] reports his blood sugars have been stable.  James [name] reports his MD recently checked his blood sugar and his cholestrol levels    Hypertension (read-only)   Goal  Participant will see blood pressure controlled within the values of 140/66m/Hg or within value directed by their physician.  BP is remaining within acceptable levels.      Progress seen toward goals   Yes Yes    Comments   KYvone Neuhad a good blood pressure today but frequent PVC's when he was at Level 7 on the Nustep T5 and watts said 100. He is going to take a copy of his PVC with no symptoms to his primary care MD today since he lives near the office.  Ken's blood pressure has been stable.. See Cardiac Reha LSI sheets.     Abnormal Lipids  (read-only)   Progress seen towards goals   Unknown Yes    Comments   KYvone Neusaid his primary care MD takes care of this.  Blood work will be checked by his primary care.       Personal Goals Discharge (Final Personal Goals and Risk Factors Review):      Goals and Risk Factor Review - 12/25/15 1058    Weight Management (read-only)   Goals Progress/Improvement seen No   Comments KYvone Neusaid he is still struggling with his weight at times but doesn't feel he eats alot. He said he didn't need to meet with the dietician since he has in the past.   Diabetes (read-only)   Progress seen towards goals Yes   Comments KYvone Neureports his MD recently checked his blood sugar and his cholestrol levels   Hypertension (read-only)   Progress seen toward goals Yes   Comments Ken's blood pressure has been stable.. See Cardiac Reha LSI sheets.    Abnormal Lipids (read-only)   Progress seen towards goals Yes   Comments Blood work will be checked by his primary care.      ITP Comments:     ITP Comments      11/18/15 1302 12/13/15 1242 12/25/15 1056 01/08/16 0846 02/07/16 1351   ITP Comments Ready for 30 day review.  Continue with ITP   New start to program, has attended three sessions. Ready for 30 day review. Continue with ITP. Discussed with KYvone Neuabout fat in [place] saturated fat in beef. KYvone Neusays he [facility] [employer] and I suggested he ask for there calorie nutrition guide (since KYvone Neudoesn't like to look up things on the computer).  30 Day Review.  Continue with ITP. discharged  completed program.    Not all outcomes completed. Was given post outcome questionnaires and not all were completed.      Comments:

## 2016-03-10 ENCOUNTER — Telehealth: Payer: Self-pay | Admitting: Urology

## 2016-03-10 DIAGNOSIS — N529 Male erectile dysfunction, unspecified: Secondary | ICD-10-CM

## 2016-03-10 NOTE — Telephone Encounter (Signed)
That will be fine. 

## 2016-03-10 NOTE — Telephone Encounter (Signed)
Patient is requesting a refill of Sildenafil sent over to [facility].

## 2016-03-11 MED ORDER — SILDENAFIL CITRATE 20 MG PO TABS: 20.0000 mg | ORAL_TABLET | Freq: Three times a day (TID) | ORAL | Status: DC

## 2016-03-11 NOTE — Telephone Encounter (Signed)
Medication has been sent to the pharmacy. 

## 2016-03-14 ENCOUNTER — Emergency Department
Admission: EM | Admit: 2016-03-14 | Discharge: 2016-03-14 | Disposition: A | Discharge status: Home / Self Care | Payer: Medicare Other | Attending: Student | Admitting: Student

## 2016-03-14 ENCOUNTER — Encounter: Payer: Self-pay | Admitting: Emergency Medicine

## 2016-03-14 DIAGNOSIS — I2510 Atherosclerotic heart disease of native coronary artery without angina pectoris: Secondary | ICD-10-CM | POA: Diagnosis not present

## 2016-03-14 DIAGNOSIS — Z79899 Other long term (current) drug therapy: Secondary | ICD-10-CM | POA: Insufficient documentation

## 2016-03-14 DIAGNOSIS — R040 Epistaxis: Secondary | ICD-10-CM | POA: Diagnosis present

## 2016-03-14 DIAGNOSIS — I110 Hypertensive heart disease with heart failure: Secondary | ICD-10-CM | POA: Diagnosis not present

## 2016-03-14 DIAGNOSIS — E119 Type 2 diabetes mellitus without complications: Secondary | ICD-10-CM | POA: Diagnosis not present

## 2016-03-14 DIAGNOSIS — Z87891 Personal history of nicotine dependence: Secondary | ICD-10-CM | POA: Diagnosis not present

## 2016-03-14 DIAGNOSIS — N400 Enlarged prostate without lower urinary tract symptoms: Secondary | ICD-10-CM | POA: Insufficient documentation

## 2016-03-14 DIAGNOSIS — E7800 Pure hypercholesterolemia, unspecified: Secondary | ICD-10-CM | POA: Insufficient documentation

## 2016-03-14 DIAGNOSIS — Z7982 Long term (current) use of aspirin: Secondary | ICD-10-CM | POA: Insufficient documentation

## 2016-03-14 DIAGNOSIS — I252 Old myocardial infarction: Secondary | ICD-10-CM | POA: Diagnosis not present

## 2016-03-14 DIAGNOSIS — Z8673 Personal history of transient ischemic attack (TIA), and cerebral infarction without residual deficits: Secondary | ICD-10-CM | POA: Diagnosis not present

## 2016-03-14 DIAGNOSIS — I5021 Acute systolic (congestive) heart failure: Secondary | ICD-10-CM | POA: Diagnosis not present

## 2016-03-14 NOTE — Discharge Instructions (Signed)
Nosebleed Nosebleeds are common. They are due to a crack in the inside lining of your nose (mucous membrane) or from a small blood vessel that starts to bleed. Nosebleeds can be caused by many conditions, such as injury, infections, dry mucous membranes or dry climate, medicines, nose picking, and home heating and cooling systems. Most nosebleeds come from blood vessels in the front of your nose. HOME CARE INSTRUCTIONS   Try controlling your nosebleed by pinching your nostrils gently and continuously for at least 10 minutes.  Avoid blowing or sniffing your nose for a number of hours after having a nosebleed.  Do not put gauze inside your nose yourself. If your nose was packed by your health care provider, try to maintain the pack inside of your nose until your health care provider removes it.  If a gauze pack was used and it starts to fall out, gently replace it or cut off the end of it.  If a balloon catheter was used to pack your nose, do not cut or remove it unless your health care provider has instructed you to do that.  Avoid lying down while you are having a nosebleed. Sit up and lean forward.  Use a nasal spray decongestant to help with a nosebleed as directed by your health care provider.  Do not use petroleum jelly or mineral oil in your nose. These can drip into your lungs.  Maintain humidity in your home by using less air conditioning or by using a humidifier.  Aspirinand blood thinners make bleeding more likely. If you are prescribed these medicines and you suffer from nosebleeds, ask your health care provider if you should stop taking the medicines or adjust the dose. Do not stop medicines unless directed by your health care provider  Resume your normal activities as you are able, but avoid straining, lifting, or bending at the waist for several days.  If your nosebleed was caused by dry mucous membranes, use over-the-counter saline nasal spray or gel. This will keep the  mucous membranes moist and allow them to heal. If you must use a lubricant, choose the water-soluble variety. Use it only sparingly, and do not use it within several hours of lying down.  Keep all follow-up visits as directed by your health care provider. This is important. SEEK MEDICAL CARE IF:  You have a fever.  You get frequent nosebleeds.  You are getting nosebleeds more often. SEEK IMMEDIATE MEDICAL CARE IF:  Your nosebleed lasts longer than 20 minutes.  Your nosebleed occurs after an injury to your face, and your nose looks crooked or broken.  You have unusual bleeding from other parts of your body.  You have unusual bruising on other parts of your body.  You feel light-headed or you faint.  You become sweaty.  You vomit blood.  Your nosebleed occurs after a head injury.   This information is not intended to replace advice given to you by your health care provider. Make sure you discuss any questions you have with your health care provider.   Document Released: 08/13/2005 Document Revised: 11/24/2014 Document Reviewed: 06/19/2014 Elsevier Interactive Patient Education 2016 [employer]   Use the nose clips and gauze soaked Neo-Synephrine or Afrin to stop bleeding. Avoid excessive manipulation of your nose with your fingers or tissue. Avoid blowing your nose for 24-hour following a nose bleed. Follow-up with Dr. Willeen CassBennett for further evaluation. Return to the ED as needed.

## 2016-03-14 NOTE — ED Notes (Signed)
Pt to ed with c/o nose bleed this am.  Pt states it lasted approx 1 hour before he was able to get it stopped.  No bleeding noted at this time.  Pt is on a blood thinner.

## 2016-03-14 NOTE — ED Provider Notes (Signed)
[facility] Emergency Department Provider Note ____________________________________________  Time seen: 1325  I have reviewed the triage vital signs and the nursing notes.  HISTORY  Chief Complaint  Epistaxis  HPI James [name] is a 70 y.o. male presents to the ED accompanied by his wife for evaluation of a nosebleed that he had this morning. The patient describes the onset of a nosebleed from the right nare that lasted for about 40 minutes, before he was able to control and stop the bleeding. The patient is on blood thinners currently and has had an increase in the frequency of his nosebleeds over the last couple of months. He also uses a CPAP for apnea and has been encouraged to use nasal saline to moisturize. He denies any headache, dizziness, nausea, or vomiting. He also denies any local trauma to the nose.  Past Medical History  Diagnosis Date  . Stroke (HCC)   . Erectile dysfunction   . BPH (benign prostatic hyperplasia)   . Erectile dysfunction   . Hypogonadism in male     Patient Active Problem List   Diagnosis Date Noted  . 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 Gout 10/24/2015  . Acute myocardial infarction of anterior wall (HCC) 10/10/2015  . Acute blood loss anemia 09/22/2015  . Ecchymosis 09/22/2015  . Acute systolic heart failure (HCC) 09/21/2015  . CAD in native artery 09/21/2015  . Frank hematuria 09/21/2015  . ST elevation myocardial infarction (STEMI) (HCC) 09/21/2015  . Avitaminosis D 08/15/2015  . 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  . 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  . Other long term (current) drug therapy 02/20/2015  . Mild cognitive disorder 01/25/2015  . Chest pain 09/20/2014  . Breathlessness on exertion 09/20/2014  . H/O transient cerebral ischemia 09/20/2014  . B12 deficiency 08/13/2014  . 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 ED (erectile  dysfunction) of organic origin 08/13/2014  . Benign essential HTN 08/13/2014  . Hypercholesterolemia 08/13/2014  . 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 Obstructive apnea 08/13/2014  . Pancytopenia (HCC) 08/13/2014  . Type 2 diabetes mellitus (HCC) 08/13/2014    Past Surgical History  Procedure Laterality Date  . Abdominal cyst surgery      Current Outpatient Rx  Name  Route  Sig  Dispense  Refill  . aspirin 81 MG tablet   Oral   Take 81 mg by mouth daily.         Marland Kitchen atorvastatin (LIPITOR) 80 MG tablet   Oral   Take 1 tablet by mouth.         . cyanocobalamin (,VITAMIN B-12,) 1000 MCG/ML injection   Intramuscular   Inject into the muscle.         . Cyanocobalamin (RA VITAMIN B-12 TR) 1000 MCG TBCR   Oral   Take by mouth daily.          . finasteride (PROSCAR) 5 MG tablet   Oral   Take by mouth.         . metoprolol succinate (TOPROL-XL) 50 MG 24 hr tablet   Oral   Take by mouth.         . ramipril (ALTACE) 10 MG capsule   Oral   Take 10 mg by mouth daily.         . sildenafil (REVATIO) 20 MG tablet   Oral   Take 1 tablet (20 mg total) by mouth 3 (three) times daily.   90 tablet  3   . tamsulosin (FLOMAX) 0.4 MG CAPS capsule   Oral   Take 1 capsule (0.4 mg total) by mouth daily after supper.   90 capsule   3   . ticagrelor (BRILINTA) 90 MG TABS tablet   Oral   Take by mouth.         . Vitamin D, Cholecalciferol, 1000 UNITS CAPS   Oral   Take by mouth daily.           Allergies Review of patient's allergies indicates no known allergies.  Family History  Problem Relation Age of Onset  . Kidney disease Neg Hx   . Prostate cancer Neg Hx   . Bladder Cancer Neg Hx     Social History Social History  Substance Use Topics  . Smoking status: Former Smoker -- 1.50 packs/day for 25 years    Quit date: 11/17/1984  . Smokeless tobacco: Never Used     Comment: "years ago"  . Alcohol Use: No   Review of Systems  Constitutional: Negative for  fever. Eyes: Negative for visual changes. ENT: Negative for sore throat. Epistaxis as above Gastrointestinal: Negative for abdominal pain, vomiting and diarrhea. Skin: Negative for rash. Neurological: Negative for headaches, focal weakness or numbness. ____________________________________________  PHYSICAL EXAM:  VITAL SIGNS: ED Triage Vitals  Enc Vitals Group     BP 03/14/16 1239 152/55 mmHg     Pulse Rate 03/14/16 1239 62     Resp 03/14/16 1239 20     Temp 03/14/16 1239 98.2 F (36.8 C)     Temp src --      SpO2 03/14/16 1239 98 %     Weight 03/14/16 1239 243 lb (110.224 kg)     Height 03/14/16 1239  (1.803 m)     Head Cir --      Peak Flow --      Pain Score 03/14/16 1240 0     Pain Loc --      Pain Edu? --      Excl. in GC? --    Constitutional: Alert and oriented. Well appearing and in no distress. Head: Normocephalic and atraumatic.      Eyes: Conjunctivae are normal. PERRL. Normal extraocular movements      Ears: Canals clear. TMs intact bilaterally.   Nose: No congestion/rhinorrhea. Moderate right nare blood scab noted on the anterior septum. No active bleeding is appreciated.    Mouth/Throat: Mucous membranes are moist.   Neck: Supple. No thyromegaly. Hematological/Lymphatic/Immunological: No cervical lymphadenopathy. Respiratory: Normal respiratory effort.  Musculoskeletal: Nontender with normal range of motion in all extremities.  Neurologic:  Normal gait without ataxia. Normal speech and language. No gross focal neurologic deficits are appreciated. Skin:  Skin is warm, dry and intact. No rash noted. ___________________________________________  PROCEDURES  Patient provided with gauze and nose clips for home use. ____________________________________________  INITIAL IMPRESSION / ASSESSMENT AND PLAN / ED COURSE  Patient with a history of an anterior epistaxis to the right nare. Patient currently is stable and bleeding is controlled. Time was  spent discussing the active management of acute nosebleeds. Patient is advised to continue to moisturize nasal sinuses using saline mist. He is also advised to use Afrin or Neo-Synephrine over-the-counter for active nosebleed management. He is provided with instructions and demonstration of the use of the Afrin-soaked gauze instilled into the nare and in no step-off or pressure. He should return to the ED for any nosebleeds which are not controlled with 45-60 minutes. He  is further referred to Dr. Levi [facility]andBennett [facility] for further evaluation and management. Return to the ED for acute epistaxis. ____________________________________________  FINAL CLINICAL IMPRESSION(S) / ED DIAGNOSES  Final diagnoses:  Anterior epistaxis      Lissa HoardJenise V Bacon Albion Weatherholtz, PA-C 03/14/16 1404  Gayla DossEryka A Gayle, MD 03/14/16 [phone]

## 2016-03-14 NOTE — ED Notes (Signed)
Patient states he had a nose bleed for 40 minutes this morning.  Patient is on blood thinners and has had an increase in nose bleeds the past couple of months.  Patient is also a CPAP patient and has not been using any nasal sprays.

## 2016-07-02 ENCOUNTER — Other Ambulatory Visit: Payer: Self-pay | Admitting: Urology

## 2016-07-02 DIAGNOSIS — N400 Enlarged prostate without lower urinary tract symptoms: Secondary | ICD-10-CM

## 2016-07-11 ENCOUNTER — Other Ambulatory Visit: Payer: Self-pay

## 2016-07-11 DIAGNOSIS — N400 Enlarged prostate without lower urinary tract symptoms: Secondary | ICD-10-CM

## 2016-07-14 ENCOUNTER — Other Ambulatory Visit: Payer: Medicare Other

## 2016-07-14 DIAGNOSIS — N400 Enlarged prostate without lower urinary tract symptoms: Secondary | ICD-10-CM

## 2016-07-15 LAB — PSA: Prostate Specific Ag, Serum: 0.5 ng/mL (ref 0.0–4.0)

## 2016-07-16 ENCOUNTER — Ambulatory Visit: Payer: Medicare Other | Admitting: Urology

## 2016-07-25 ENCOUNTER — Ambulatory Visit (INDEPENDENT_AMBULATORY_CARE_PROVIDER_SITE_OTHER): Payer: Medicare Other | Admitting: Urology

## 2016-07-25 ENCOUNTER — Encounter: Payer: Self-pay | Admitting: Urology

## 2016-07-25 ENCOUNTER — Telehealth: Payer: Self-pay | Admitting: Urology

## 2016-07-25 VITALS — BP 147/74 | HR 61 | Ht 71.0 in | Wt 267.6 lb

## 2016-07-25 DIAGNOSIS — N528 Other male erectile dysfunction: Secondary | ICD-10-CM

## 2016-07-25 DIAGNOSIS — N401 Enlarged prostate with lower urinary tract symptoms: Secondary | ICD-10-CM | POA: Diagnosis not present

## 2016-07-25 DIAGNOSIS — N529 Male erectile dysfunction, unspecified: Secondary | ICD-10-CM

## 2016-07-25 DIAGNOSIS — N138 Other obstructive and reflux uropathy: Secondary | ICD-10-CM

## 2016-07-25 MED ORDER — FINASTERIDE 5 MG PO TABS: 5.0000 mg | ORAL_TABLET | Freq: Every day | ORAL | 4 refills | Status: AC

## 2016-07-25 MED ORDER — TAMSULOSIN HCL 0.4 MG PO CAPS: 0.4000 mg | ORAL_CAPSULE | Freq: Every day | ORAL | 4 refills | Status: DC

## 2016-07-25 NOTE — Telephone Encounter (Signed)
Spoke with PCP office they called because patient called them. Waiting on letter for clearance. Patient called back demanding they told him he could have his medication (Viagra) I asked patient to calm down and stop raising his voice to me that I was only trying to help him and make sure he is safe to continue the medication and I let him know that Carollee HerterShannon was out of the office until Monday and she needed to review the clearance and make sure it is acceptable and that I would call him back on Monday and call in his medication in to [facility] if Carollee HerterShannon decides to refill. Patient calmed down and apologized and is ok with plan.

## 2016-07-25 NOTE — Telephone Encounter (Signed)
Would you call Dr. Darrold JunkerParaschos' office and ask them if he is cleared for sexual activity and to take Viagra?

## 2016-07-25 NOTE — Progress Notes (Signed)
07/25/2016 9:02 AM   [name] [birth year] [id number]  Referring provider: Mickey Farber, MD [address] [facility] [place], [place] [address]  Chief Complaint  Patient presents with  . Benign Prostatic Hypertrophy    1 year follow up   . Erectile Dysfunction    HPI: Patient is a 70 year old Caucasian male with erectile dysfunction and BPH with LUTS who presents today for one year recheck.   BPH with LUTS His IPSS score today is 4, which is mild lower urinary tract symptomatology. He is pleased with his quality life due to his urinary symptoms.   His previous IPSS score was 5/2.  His major complaint today is urinary frequency.   He has had these symptoms for many years.  He denies any dysuria, hematuria or suprapubic pain.   He currently taking tamsulosin 0.4 mg daily and finasteride 5 mg daily.  He also denies any recent UTI's, fevers, chills, nausea or vomiting.  He does not have a family history of PCa.      IPSS    Row Name 07/25/16 0800         International Prostate Symptom Score   How often have you had the sensation of not emptying your bladder? Less than 1 in 5     How often have you had to urinate less than every two hours? Less than 1 in 5 times     How often have you found you stopped and started again several times when you urinated? Less than 1 in 5 times     How often have you found it difficult to postpone urination? Not at All     How often have you had a weak urinary stream? Not at All     How often have you had to strain to start urination? Not at All     How many times did you typically get up at night to urinate? 1 Time     Total IPSS Score 4       Quality of Life due to urinary symptoms   If you were to spend the rest of your life with your urinary condition just the way it is now how would you feel about that? Pleased        Score:  1-7 Mild 8-19 Moderate 20-35 Severe   Erectile dysfunction His SHIM score is 5, which is severe  ED.   His previous SHIM score was 5.  He has been having difficulty with erections for several years.   His major complaint is maintaining erections.  His libido is preserved.   His risk factors for ED are BPH, hypertension, advanced age, history of stroke and memory loss .  He denies any painful erections or curvatures with his erections.   He has tried PDE 5 inhibitors and they are effective, but he finds them cost prohibitive.  He is currently taking sildenafil 20 mg once daily.  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  He states his cardiologist cleared him for taking the sildenafil, but he is exhibiting memory issues at this visit.  I would like to contact his cardiologist's office for clearance to take the sildenafil.        SHIM    Row Name 07/25/16 0850         SHIM: Over the last 6 months:   How do you rate your confidence that you could get and keep an erection? Very Low  When you had erections with sexual stimulation, how often were your erections hard enough for penetration (entering your partner)? Almost Never or Never     During sexual intercourse, how often were you able to maintain your erection after you had penetrated (entered) your partner? Extremely Difficult     During sexual intercourse, how difficult was it to maintain your erection to completion of intercourse? Extremely Difficult     When you attempted sexual intercourse, how often was it satisfactory for you? Extremely Difficult       SHIM Total Score   SHIM 5        Score: 1-7 Severe ED 8-11 Moderate ED 12-16 Mild-Moderate ED 17-21 Mild ED 22-25 No ED   PMH: Past Medical History:  Diagnosis Date  . BPH (benign prostatic hyperplasia)   . Erectile dysfunction   . Erectile dysfunction   . Heart attack (HCC)   . Hypogonadism in male   . Stroke [facility](HCC)     Surgical History: Past Surgical History:  Procedure Laterality Date  . abdominal cyst surgery    . cardiac bypass     3 stents  puts in    Home Medications:    Medication List       Accurate as of 07/25/16  9:02 AM. Always use your most recent med list.          allopurinol 100 MG tablet Commonly known as:  ZYLOPRIM Take by mouth.   aspirin 81 MG tablet Take 81 mg by mouth daily.   atorvastatin 80 MG tablet Commonly known as:  LIPITOR Take 1 tablet by mouth.   donepezil 10 MG tablet Commonly known as:  ARICEPT Take by mouth.   finasteride 5 MG tablet Commonly known as:  PROSCAR Take 1 tablet (5 mg total) by mouth daily.   metoprolol succinate 50 MG 24 hr tablet Commonly known as:  TOPROL-XL Take by mouth.   nitroGLYCERIN 0.4 MG SL tablet Commonly known as:  NITROSTAT Place under the tongue.   RA VITAMIN B-12 TR 1000 MCG Tbcr Generic drug:  Cyanocobalamin Take by mouth daily.   cyanocobalamin 1000 MCG/ML injection Commonly known as:  (VITAMIN B-12) Inject into the muscle.   ramipril 10 MG capsule Commonly known as:  ALTACE Take 10 mg by mouth daily.   sildenafil 20 MG tablet Commonly known as:  REVATIO Take 1 tablet (20 mg total) by mouth 3 (three) times daily.   tamsulosin 0.4 MG Caps capsule Commonly known as:  FLOMAX Take 1 capsule (0.4 mg total) by mouth daily after supper.   ticagrelor 90 MG Tabs tablet Commonly known as:  BRILINTA Take by mouth.   Vitamin D (Cholecalciferol) 1000 units Caps Take by mouth daily.       Allergies: No Known Allergies  Family History: Family History  Problem Relation Age of Onset  . Kidney disease Neg Hx   . Prostate cancer Neg Hx   . Bladder Cancer Neg Hx     Social History:  reports that he quit smoking about 31 years ago. He has a 37.50 pack-year smoking history. He has never used smokeless tobacco. He reports that he does not drink alcohol or use drugs.  ROS: UROLOGY Frequent Urination?: Yes Hard to postpone urination?: No Burning/pain with urination?: No Get up at night to urinate?: No Leakage of urine?: No Urine  stream starts and stops?: No Trouble starting stream?: No Do you have to strain to urinate?: No Blood in urine?: No Urinary tract infection?:  No Sexually transmitted disease?: No Injury to kidneys or bladder?: No Painful intercourse?: No Weak stream?: No Erection problems?: No Penile pain?: No  Gastrointestinal Nausea?: No Vomiting?: No Indigestion/heartburn?: No Diarrhea?: No Constipation?: No  Constitutional Fever: No Night sweats?: No Weight loss?: No Fatigue?: No  Skin Skin rash/lesions?: No Itching?: No  Eyes Blurred vision?: No Double vision?: No  Ears/Nose/Throat Sore throat?: No Sinus problems?: No  Hematologic/Lymphatic Swollen glands?: No Easy bruising?: No  Cardiovascular Leg swelling?: No Chest pain?: No  Respiratory Cough?: No Shortness of breath?: No  Endocrine Excessive thirst?: No  Musculoskeletal Back pain?: No Joint pain?: No  Neurological Headaches?: No Dizziness?: No  Psychologic Depression?: No Anxiety?: No  Physical Exam: BP (!) 147/74   Pulse 61   Ht 5\' 11"  (1.803 m)   Wt 267 lb 9.6 oz (121.4 kg)   BMI 37.32 kg/m   Constitutional: Well nourished. Alert and oriented, No acute distress. HEENT: [place] AT, moist mucus membranes. Trachea midline, no masses. Cardiovascular: No clubbing, cyanosis, or edema. Respiratory: Normal respiratory effort, no increased work of breathing. GI: Abdomen is soft, non tender, non distended, no abdominal masses. Liver and spleen not palpable.  No hernias appreciated.  Stool sample for occult testing is not indicated.   GU: No CVA tenderness.  No bladder fullness or masses.  Patient with circumcised.  Urethral meatus is patent.  No penile discharge. No penile lesions or rashes. Scrotum without lesions, cysts, rashes and/or edema.  Testicles are located scrotally bilaterally. No masses are appreciated in the testicles. Left and right epididymis are normal. Rectal: Patient with  normal sphincter  tone. Perineum without scarring or rashes. No rectal masses are appreciated. Prostate is approximately 45 grams, no nodules are appreciated. Seminal vesicles are normal. Skin: No rashes, bruises or suspicious lesions. Lymph: No cervical or inguinal adenopathy. Neurologic: Grossly intact, no focal deficits, moving all 4 extremities. Psychiatric: Normal mood and affect.  Laboratory Data: Lab Results  Component Value Date   WBC 5.4 04/18/2014   HGB 14.0 04/18/2014   HCT 41.5 04/18/2014   MCV 85 04/18/2014   PLT 149 (L) 04/18/2014   PSA history:  1.4 ng/mL on 12/22/2012             1.3 ng/mL on 07/04/2013  1.5 ng/mL on 07/04/2014  1.3 ng/mL on 08/00/2016  0.5 ng/mL on 07/14/2016   Assessment & Plan:    1. BPH with LUTS  - IPSS score is 4/1, it is stable  - Continue conservative management, avoiding bladder irritants and timed voiding's  - Continue tamsulosin 0.4 mg daily and finasteride 5 mg daily; meds refilled   - Cannot tolerate medication or medication failure, schedule cystoscopy  - RTC in 12 months for IPSS, PSA and exam   2. Erectile dysfunction:    - SHIM score is 5, it is stable  - Need approval from cardiology before filling his sildenafil prescription  - RTC in 6 months for repeat SHIM score and exam  Return in about 1 year (around 07/25/2017) for IPSS, SHIM, PSA and exam.  Michiel Cowboy, [facility]  [facility] [address], [address] [place], [place] [address] [phone]

## 2016-07-28 ENCOUNTER — Telehealth: Payer: Self-pay | Admitting: *Deleted

## 2016-07-28 NOTE — Telephone Encounter (Signed)
LMOM medication will be called in to [facility]. Letter of clearance approved.

## 2017-03-17 IMAGING — MR MR ABDOMEN WO/W CM
17 of 18 series · 43 of 48 positions shown · IV contrast (multihance)
Comparison: CT 09/20/2015

CLINICAL DATA: Followup hepatic lesions from prior CT.

EXAM:
MRI ABDOMEN WITHOUT AND WITH CONTRAST
TECHNIQUE: Multiplanar multisequence MR imaging of the abdomen was performed
both before and after the administration of intravenous contrast.
CONTRAST:  20mL MULTIHANCE GADOBENATE DIMEGLUMINE 529 MG/ML IV SOLN

[Series 2: cor ssfse / · coronal · 7.0mm · 1.72mm/px · 2 of 35 slices shown]
[im 1/35]
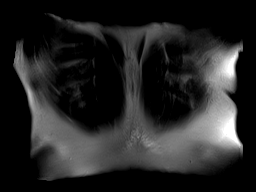
[im 35/35]
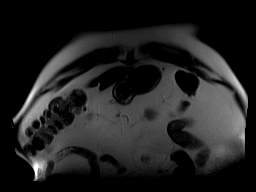

[Series 3: T2 fat-sat · axial · 6.0mm · 1.72mm/px · z∈[-136,+94]mm · 2 of 33 slices shown]
[im 1/33]
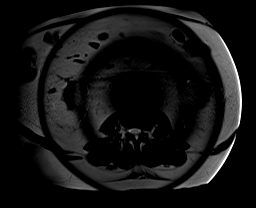
[im 33/33]
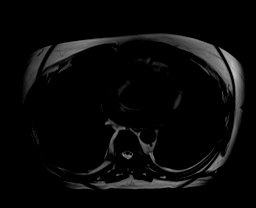

[Series 5: bSSFP · axial · 6.0mm · 0.86mm/px · z∈[-136,+94]mm · 2 of 33 slices shown]
[im 1/33]
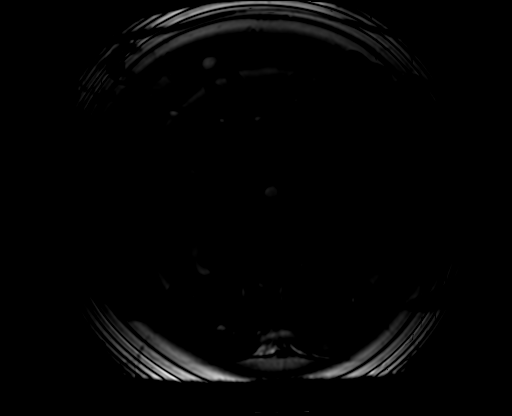
[im 33/33]
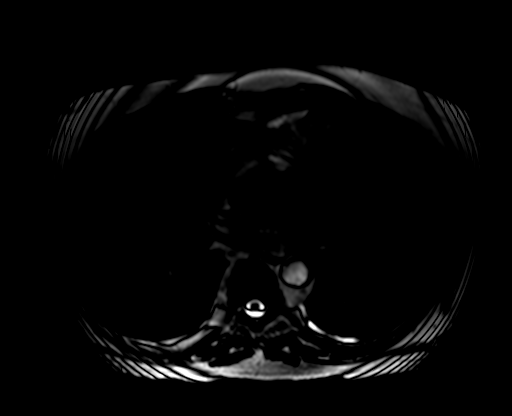

[Series 8: DWI · axial · 6.0mm · 2.29mm/px · z∈[-147,+105]mm · 5 of 108 slices shown (1 of 2)]
[im 1/108]
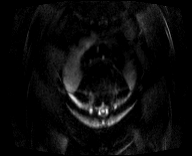
[im 27/108]
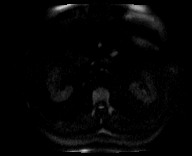
[im 54/108]
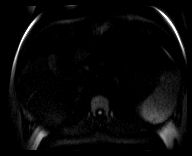
[im 81/108]
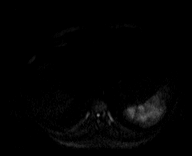
[im 108/108]
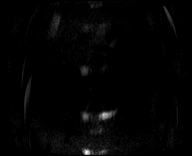

[Series 9: DWI · axial · 6.0mm · 2.29mm/px · z∈[-147,+105]mm · 2 of 36 slices shown (2 of 2)]
[im 1/36]
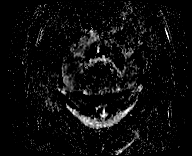
[im 36/36]
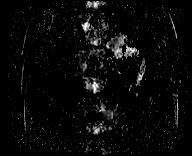

[Series 10: axial dynamic pre · axial · non-contrast · 3.0mm · 1.38mm/px · z∈[-140,+97]mm · 3 of 80 slices shown]
[im 1/80]
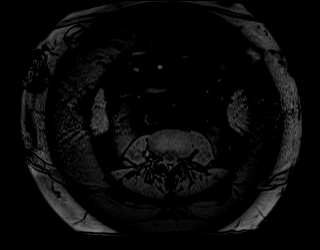
[im 40/80]
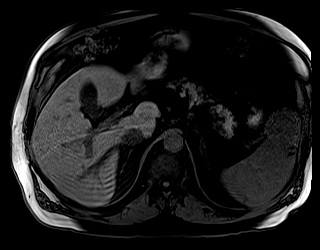
[im 80/80]
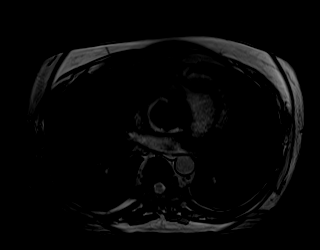

[Series 11: axial dynamic post · axial · 3.0mm · 1.38mm/px · z∈[-140,+97]mm · 3 of 80 slices shown (1 of 3)]
[im 1/80]
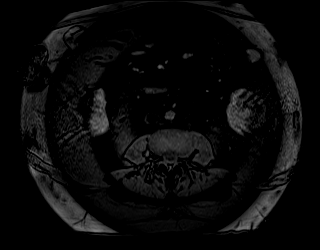
[im 40/80]
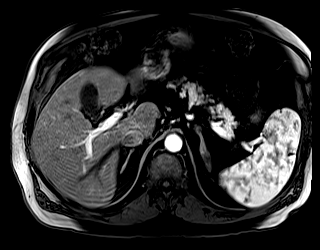
[im 80/80]
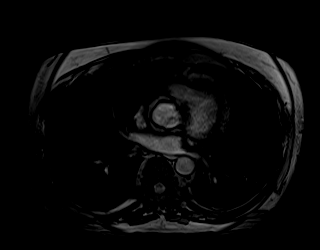

[Series 12: axial dynamic post · axial · 3.0mm · 1.38mm/px · z∈[-140,+97]mm · 3 of 80 slices shown (2 of 3)]
[im 1/80]
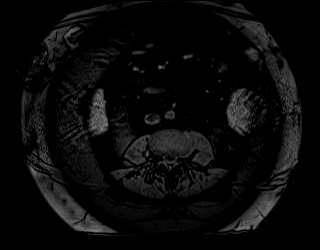
[im 40/80]
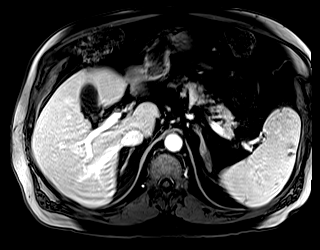
[im 80/80]
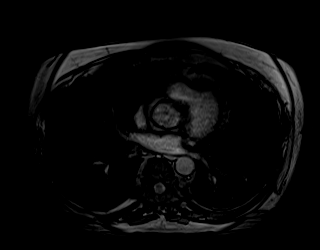

[Series 13: axial dynamic post · axial · 3.0mm · 1.38mm/px · z∈[-140,+97]mm · 3 of 80 slices shown (3 of 3)]
[im 1/80]
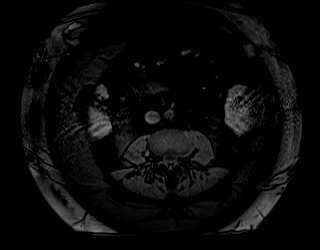
[im 40/80]
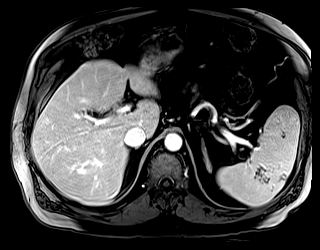
[im 80/80]
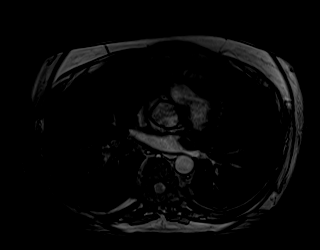

[Series 15: axial ssfse / · axial · 6.0mm · 1.38mm/px · 1 of 33 slices shown]
[im 1/33]
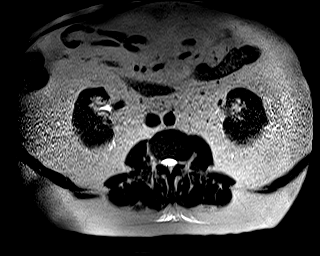

[Series 16: axial dynamic delayed · axial · 3.0mm · 1.38mm/px · z∈[-140,+97]mm · 3 of 80 slices shown]
[im 1/80]
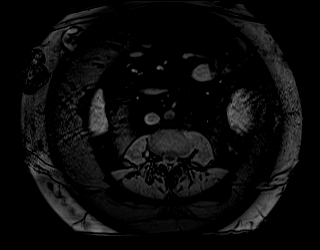
[im 40/80]
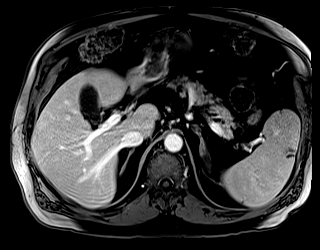
[im 80/80]
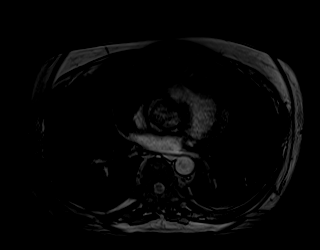

[Series 100: out of phase · axial · 6.0mm · 0.86mm/px · 1 of 33 slices shown]
[im 1/33]
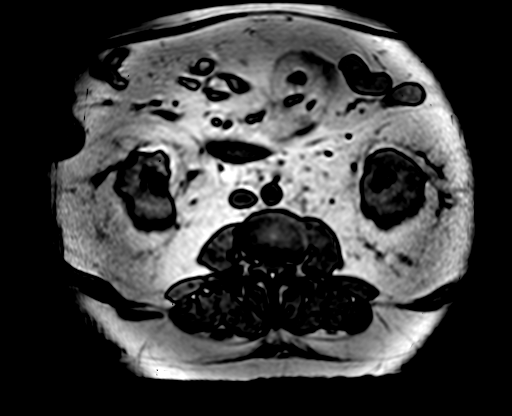

[Series 101: in phase · axial · 6.0mm · 0.86mm/px · 1 of 33 slices shown]
[im 1/33]
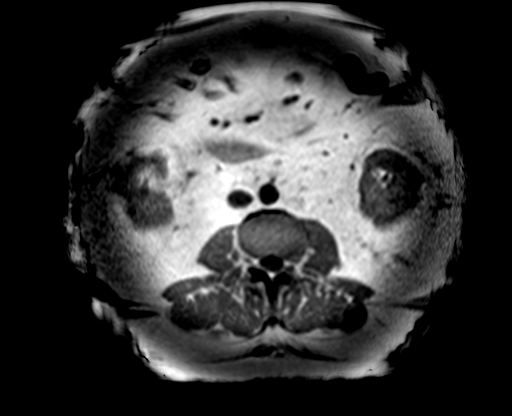

[Series 103: immed sub · axial · 3.0mm · 1.38mm/px · z∈[-140,+97]mm · 3 of 80 slices shown]
[im 1/80]
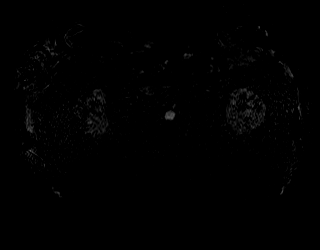
[im 40/80]
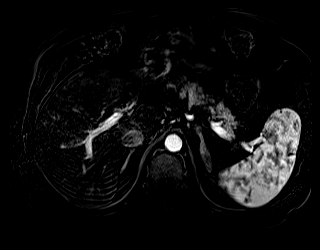
[im 80/80]
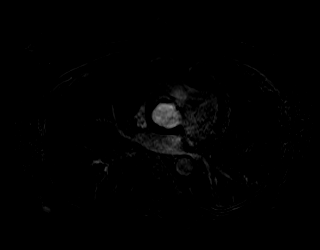

[Series 105: 45 sec sub · axial · 3.0mm · 1.38mm/px · z∈[-140,+97]mm · 3 of 80 slices shown]
[im 1/80]
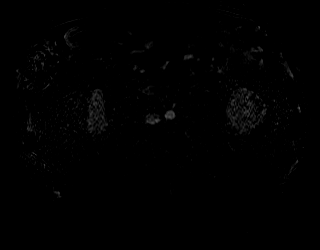
[im 40/80]
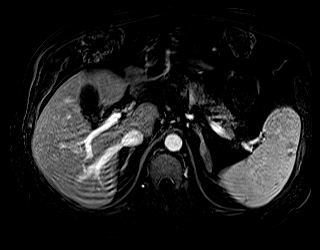
[im 80/80]
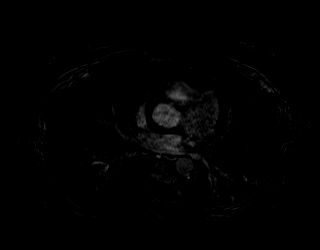

[Series 107: 90 sec sub · axial · 3.0mm · 1.38mm/px · z∈[-140,+97]mm · 3 of 79 slices shown]
[im 1/79]
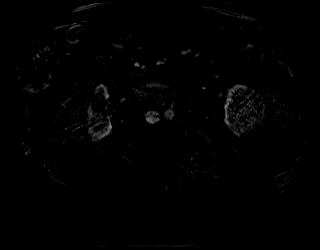
[im 40/79]
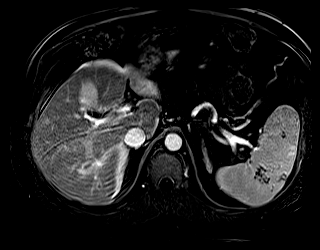
[im 79/79]
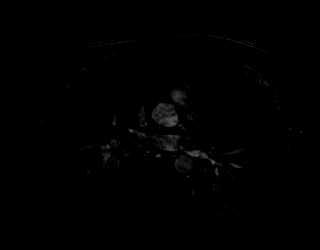

[Series 109: 3 min sub · axial · 3.0mm · 1.38mm/px · z∈[-140,+97]mm · 3 of 80 slices shown]
[im 1/80]
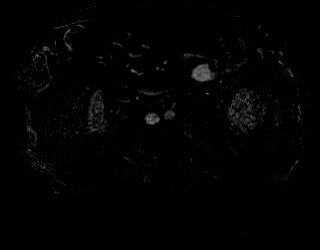
[im 40/80]
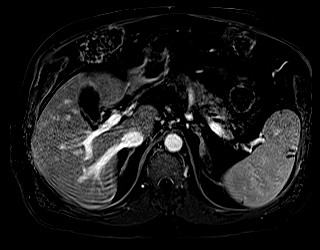
[im 80/80]
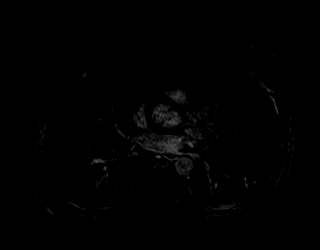

[43 of 48 positions shown; findings below may reference images not displayed]

FINDINGS: Lower chest:  Lung bases are clear.

Hepatobiliary: The 2 lesions identified on comparison CT at high
signal intensity a on T2 weighted imaging and the delayed
post-contrast enhancement the most consistent with benign
hemangiomas. The larger lesion measures 15 mm (image 32, series 12).
The smaller lesion measures 9 mm on image 35, series 12. T2
hyperintensity noted on series 15 and 8.

No biliary duct dilatation. The gallbladder is normal. The common
bile duct normal caliber.

Pancreas: Normal pancreatic parenchymal intensity. No ductal
dilatation or inflammation.

Spleen: Normal spleen.

Adrenals/urinary tract: Adrenal glands and kidneys are normal.

Stomach/Bowel: Stomach and limited of the small bowel is
unremarkable

Vascular/Lymphatic: Abdominal aortic normal caliber. No
retroperitoneal periportal lymphadenopathy.

Musculoskeletal: No aggressive osseous lesion
IMPRESSION: 1. Two lesions within the liver parenchyma have imaging
characteristics most consistent benign hemangiomas.
2. Normal biliary tree.
3. Normal pancreas

## 2017-03-30 ENCOUNTER — Other Ambulatory Visit: Payer: Self-pay | Admitting: Internal Medicine

## 2017-03-30 ENCOUNTER — Ambulatory Visit
Admission: RE | Admit: 2017-03-30 | Discharge: 2017-03-30 | Disposition: A | Discharge status: Home / Self Care | Payer: Medicare Other | Source: Ambulatory Visit | Attending: Internal Medicine | Admitting: Internal Medicine

## 2017-03-30 DIAGNOSIS — N50812 Left testicular pain: Secondary | ICD-10-CM

## 2017-03-30 DIAGNOSIS — N433 Hydrocele, unspecified: Secondary | ICD-10-CM | POA: Insufficient documentation

## 2017-03-30 DIAGNOSIS — R234 Changes in skin texture: Secondary | ICD-10-CM | POA: Diagnosis not present

## 2017-07-22 ENCOUNTER — Other Ambulatory Visit: Payer: Medicare Other

## 2017-07-22 ENCOUNTER — Other Ambulatory Visit: Payer: Self-pay

## 2017-07-22 DIAGNOSIS — N401 Enlarged prostate with lower urinary tract symptoms: Secondary | ICD-10-CM

## 2017-07-23 LAB — PSA: Prostate Specific Ag, Serum: 0.3 ng/mL (ref 0.0–4.0)

## 2017-07-26 NOTE — Progress Notes (Signed)
07/27/2017 4:23 PM   James GeorgesKenneth [name] 06/17/1946 [id number]  Referring provider: Mickey Farberhies, David, MD [address] [facility] [place], [place] [id number]  Chief Complaint  Patient presents with  . Benign Prostatic Hypertrophy    1 year follow up   . Erectile Dysfunction    HPI: Patient is a 71 year old Caucasian male with erectile dysfunction and BPH with LUTS who presents today for one year recheck.   BPH with LUTS His IPSS score today is 4, which is mild lower urinary tract symptomatology. He is mostly satisfied with his quality life due to his urinary symptoms.   His previous IPSS score was 4/1.  His major complaint today is urinary frequency.   He has had these symptoms for many years.  He denies any dysuria, hematuria or suprapubic pain.   He currently taking tamsulosin 0.4 mg daily and finasteride 5 mg daily.  He also denies any recent UTI's, fevers, chills, nausea or vomiting.  He does not have a family history of PCa.      IPSS    Row Name 07/27/17 1500         International Prostate Symptom Score   How often have you had the sensation of not emptying your bladder? Less than 1 in 5     How often have you had to urinate less than every two hours? Less than 1 in 5 times     How often have you found you stopped and started again several times when you urinated? Not at All     How often have you found it difficult to postpone urination? Not at All     How often have you had a weak urinary stream? Not at All     How often have you had to strain to start urination? Not at All     How many times did you typically get up at night to urinate? 2 Times     Total IPSS Score 4       Quality of Life due to urinary symptoms   If you were to spend the rest of your life with your urinary condition just the way it is now how would you feel about that? Mostly Satisfied        Score:  1-7 Mild 8-19 Moderate 20-35 Severe   Erectile dysfunction His SHIM score is 5, which  is severe ED.   His previous SHIM score was 5.  He has been having difficulty with erections for several years.   His major complaint is maintaining erections.  His libido is preserved.   His risk factors for ED are BPH, hypertension, advanced age, history of stroke and memory loss .  He denies any painful erections or curvatures with his erections.   He has tried PDE 5 inhibitors and they are effective, but he finds them cost prohibitive.  He is currently taking sildenafil 20 mg once daily.  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        SHIM    Row Name 07/27/17 1532         SHIM: Over the last 6 months:   How do you rate your confidence that you could get and keep an erection? Very Low     When you had erections with sexual stimulation, how often were your erections hard enough for penetration (entering your partner)? Almost Never or Never     During sexual intercourse, how often were you  able to maintain your erection after you had penetrated (entered) your partner? Almost Never or Never     During sexual intercourse, how difficult was it to maintain your erection to completion of intercourse? Extremely Difficult     When you attempted sexual intercourse, how often was it satisfactory for you? Almost Never or Never       SHIM Total Score   SHIM 5        Score: 1-7 Severe ED 8-11 Moderate ED 12-16 Mild-Moderate ED 17-21 Mild ED 22-25 No ED   PMH: Past Medical History:  Diagnosis Date  . BPH (benign prostatic hyperplasia)   . Erectile dysfunction   . Erectile dysfunction   . Heart attack (HCC)   . Hypogonadism in male   . Stroke [facility])     Surgical History: Past Surgical History:  Procedure Laterality Date  . abdominal cyst surgery    . cardiac bypass     3 stents puts in    Home Medications:  Allergies as of 07/27/2017   No Known Allergies     Medication List       Accurate as of 07/27/17  4:23 PM. Always use your most recent med list.            allopurinol 100 MG tablet Commonly known as:  ZYLOPRIM Take by mouth.   aspirin 81 MG tablet Take 81 mg by mouth daily.   atorvastatin 80 MG tablet Commonly known as:  LIPITOR Take 1 tablet by mouth.   atorvastatin 80 MG tablet Commonly known as:  LIPITOR Take by mouth.   colchicine 0.6 MG tablet Take 2 tablets (1.2mg ) by mouth at first sign of gout flare followed by 1 tablet (0.6mg ) after 1 hour. (Max 1.8mg  within 1 hour)   donepezil 10 MG tablet Commonly known as:  ARICEPT Take by mouth.   finasteride 5 MG tablet Commonly known as:  PROSCAR Take 1 tablet (5 mg total) by mouth daily.   metFORMIN 500 MG tablet Commonly known as:  GLUCOPHAGE Take by mouth.   metoprolol succinate 50 MG 24 hr tablet Commonly known as:  TOPROL-XL Take by mouth.   metoprolol succinate 50 MG 24 hr tablet Commonly known as:  TOPROL-XL Take by mouth.   nitroGLYCERIN 0.4 MG SL tablet Commonly known as:  NITROSTAT Place under the tongue.   nitroGLYCERIN 0.4 MG SL tablet Commonly known as:  NITROSTAT Place under the tongue.   RA VITAMIN B-12 TR 1000 MCG Tbcr Generic drug:  Cyanocobalamin Take by mouth daily.   cyanocobalamin 1000 MCG/ML injection Commonly known as:  (VITAMIN B-12) Inject into the muscle.   ramipril 10 MG capsule Commonly known as:  ALTACE Take 10 mg by mouth daily.   sildenafil 20 MG tablet Commonly known as:  REVATIO Take 1 tablet (20 mg total) by mouth 3 (three) times daily.   tamsulosin 0.4 MG Caps capsule Commonly known as:  FLOMAX Take 1 capsule (0.4 mg total) by mouth daily after supper.   ticagrelor 90 MG Tabs tablet Commonly known as:  BRILINTA Take by mouth.   Vitamin D (Cholecalciferol) 1000 units Caps Take by mouth daily.            Discharge Care Instructions        Start     Ordered   07/27/17 0000  Testosterone     07/27/17 1509   07/27/17 0000  TSH     07/27/17 1509   07/27/17 0000  tamsulosin (FLOMAX) 0.4  MG CAPS capsule   Daily after supper    Question:  Supervising Provider  Answer:  Vanna Scotland   07/27/17 1619   07/27/17 0000  finasteride (PROSCAR) 5 MG tablet  Daily    Question:  Supervising Provider  Answer:  Vanna Scotland   07/27/17 1619   07/27/17 0000  sildenafil (REVATIO) 20 MG tablet  3 times daily    Question:  Supervising Provider  Answer:  Vanna Scotland   07/27/17 1622      Allergies: No Known Allergies  Family History: Family History  Problem Relation Age of Onset  . Kidney disease Neg Hx   . Prostate cancer Neg Hx   . Bladder Cancer Neg Hx   . Kidney cancer Neg Hx     Social History:  reports that he quit smoking about 32 years ago. He has a 37.50 pack-year smoking history. He has never used smokeless tobacco. He reports that he does not drink alcohol or use drugs.  ROS: UROLOGY Frequent Urination?: No Hard to postpone urination?: No Burning/pain with urination?: No Get up at night to urinate?: No Leakage of urine?: No Urine stream starts and stops?: No Trouble starting stream?: No Do you have to strain to urinate?: No Blood in urine?: No Urinary tract infection?: No Sexually transmitted disease?: No Injury to kidneys or bladder?: No Painful intercourse?: No Weak stream?: No Erection problems?: No Penile pain?: No  Gastrointestinal Nausea?: No Vomiting?: No Indigestion/heartburn?: No Diarrhea?: No Constipation?: No  Constitutional Fever: No Night sweats?: No Weight loss?: No Fatigue?: No  Skin Skin rash/lesions?: No Itching?: No  Eyes Blurred vision?: No Double vision?: No  Ears/Nose/Throat Sore throat?: No Sinus problems?: No  Hematologic/Lymphatic Swollen glands?: No Easy bruising?: No  Cardiovascular Leg swelling?: No Chest pain?: No  Respiratory Cough?: No Shortness of breath?: No  Endocrine Excessive thirst?: No  Musculoskeletal Back pain?: No Joint pain?: No  Neurological Headaches?: No Dizziness?:  No  Psychologic Depression?: No Anxiety?: No  Physical Exam: BP (!) 157/72   Pulse 72   Ht 5' 11.75" (1.822 m)   Wt 285 lb 6.4 oz (129.5 kg)   BMI 38.98 kg/m   Constitutional: Well nourished. Alert and oriented, No acute distress. HEENT: [place] AT, moist mucus membranes. Trachea midline, no masses. Cardiovascular: No clubbing, cyanosis, or edema. Respiratory: Normal respiratory effort, no increased work of breathing. GI: Abdomen is soft, non tender, non distended, no abdominal masses. Liver and spleen not palpable.  No hernias appreciated.  Stool sample for occult testing is not indicated.   GU: No CVA tenderness.  No bladder fullness or masses.  Patient with circumcised.  Urethral meatus is patent.  No penile discharge. No penile lesions or rashes. Scrotum without lesions, cysts, rashes and/or edema.  Testicles are located scrotally bilaterally. No masses are appreciated in the testicles. Left and right epididymis are normal. Rectal: Patient with  normal sphincter tone. Perineum without scarring or rashes. No rectal masses are appreciated. Prostate is approximately 45 grams, no nodules are appreciated. Seminal vesicles are normal. Skin: No rashes, bruises or suspicious lesions. Lymph: No cervical or inguinal adenopathy. Neurologic: Grossly intact, no focal deficits, moving all 4 extremities. Psychiatric: Normal mood and affect.  Laboratory Data: Lab Results  Component Value Date   WBC 5.4 04/18/2014   HGB 14.0 04/18/2014   HCT 41.5 04/18/2014   MCV 85 04/18/2014   PLT 149 (L) 04/18/2014   PSA history:  1.4 ng/mL on 12/22/2012  1.3 ng/mL on 07/04/2013  1.5 ng/mL on 07/04/2014  1.3 ng/mL on 08/00/2016  0.5 ng/mL on 07/14/2016  0.3 ng/mL on 07/22/2017   Assessment & Plan:    1. BPH with LUTS  - IPSS score is 4/1, it is stable  - Continue conservative management, avoiding bladder irritants and timed voiding's  - Continue tamsulosin 0.4 mg daily and finasteride 5  mg daily; meds refilled   - RTC in 12 months for IPSS, PSA and exam   2. Erectile dysfunction:    - SHIM score is 5, it is stable  - per new AUA guidelines for ED will check testosterone and TSH levels  - RTC in 12 months for repeat SHIM score and exam  Return in about 1 year (around 07/27/2018) for IPSS, SHIM, PSA and exam.  Michiel Cowboy, [facility]  [facility] [address] [place], [place] [address] [phone]

## 2017-07-27 ENCOUNTER — Ambulatory Visit: Payer: Medicare Other | Admitting: Urology

## 2017-07-27 ENCOUNTER — Encounter: Payer: Self-pay | Admitting: Urology

## 2017-07-27 ENCOUNTER — Ambulatory Visit (INDEPENDENT_AMBULATORY_CARE_PROVIDER_SITE_OTHER): Payer: Medicare Other | Admitting: Urology

## 2017-07-27 VITALS — BP 157/72 | HR 72 | Ht 71.75 in | Wt 285.4 lb

## 2017-07-27 DIAGNOSIS — N138 Other obstructive and reflux uropathy: Secondary | ICD-10-CM | POA: Diagnosis not present

## 2017-07-27 DIAGNOSIS — N529 Male erectile dysfunction, unspecified: Secondary | ICD-10-CM | POA: Diagnosis not present

## 2017-07-27 DIAGNOSIS — N401 Enlarged prostate with lower urinary tract symptoms: Secondary | ICD-10-CM | POA: Diagnosis not present

## 2017-07-27 MED ORDER — TAMSULOSIN HCL 0.4 MG PO CAPS: 0.4000 mg | ORAL_CAPSULE | Freq: Every day | ORAL | 3 refills | Status: DC

## 2017-07-27 MED ORDER — SILDENAFIL CITRATE 20 MG PO TABS: 20.0000 mg | ORAL_TABLET | Freq: Three times a day (TID) | ORAL | 3 refills | Status: DC

## 2017-07-27 MED ORDER — FINASTERIDE 5 MG PO TABS: 5.0000 mg | ORAL_TABLET | Freq: Every day | ORAL | 3 refills | Status: AC

## 2017-07-28 ENCOUNTER — Telehealth: Payer: Self-pay | Admitting: Family Medicine

## 2017-07-28 LAB — TSH: TSH: 3.51 u[IU]/mL (ref 0.450–4.500)

## 2017-07-28 LAB — TESTOSTERONE: TESTOSTERONE: 274 ng/dL (ref 264–916)

## 2017-07-28 NOTE — Telephone Encounter (Signed)
No answer-- unable to leave message.

## 2017-07-28 NOTE — Telephone Encounter (Signed)
-----   Message from Harle BattiestShannon A McGowan, PA-C sent at 07/28/2017  7:56 AM EDT ----- Please let James [name] know that his testosterone level is below 300.  We will need to have it repeated before 9 AM to confirm.

## 2017-07-31 NOTE — Telephone Encounter (Signed)
LM with wife for patient to call office back about labs.

## 2017-08-03 NOTE — Telephone Encounter (Signed)
No answer. Will send a letter.  

## 2017-08-07 ENCOUNTER — Other Ambulatory Visit: Payer: Medicare Other

## 2017-08-07 ENCOUNTER — Other Ambulatory Visit: Payer: Self-pay

## 2017-08-07 DIAGNOSIS — E291 Testicular hypofunction: Secondary | ICD-10-CM

## 2017-08-08 LAB — TESTOSTERONE: TESTOSTERONE: 404 ng/dL (ref 264–916)

## 2017-08-10 ENCOUNTER — Telehealth: Payer: Self-pay

## 2017-08-10 NOTE — Telephone Encounter (Signed)
Pt called and I read message from [facility] letting him know his testosterone level is in normal range.

## 2017-08-10 NOTE — Telephone Encounter (Signed)
Left pt mess to call 

## 2017-08-10 NOTE — Telephone Encounter (Signed)
-----   Message from Harle Battiest, PA-C sent at 08/10/2017  7:46 AM EDT ----- Please let James [name] know that his testosterone level is in the normal range.

## 2017-08-14 DIAGNOSIS — Z6841 Body Mass Index (BMI) 40.0 and over, adult: Secondary | ICD-10-CM | POA: Insufficient documentation

## 2018-05-06 DIAGNOSIS — G309 Alzheimer's disease, unspecified: Secondary | ICD-10-CM | POA: Insufficient documentation

## 2018-05-06 DIAGNOSIS — F0150 Vascular dementia without behavioral disturbance: Secondary | ICD-10-CM | POA: Insufficient documentation

## 2018-07-21 ENCOUNTER — Other Ambulatory Visit: Payer: Self-pay

## 2018-07-21 DIAGNOSIS — N138 Other obstructive and reflux uropathy: Secondary | ICD-10-CM

## 2018-07-21 DIAGNOSIS — N401 Enlarged prostate with lower urinary tract symptoms: Principal | ICD-10-CM

## 2018-07-22 ENCOUNTER — Other Ambulatory Visit: Payer: Self-pay

## 2018-07-22 DIAGNOSIS — N138 Other obstructive and reflux uropathy: Secondary | ICD-10-CM

## 2018-07-22 DIAGNOSIS — N401 Enlarged prostate with lower urinary tract symptoms: Principal | ICD-10-CM

## 2018-07-22 MED ORDER — TAMSULOSIN HCL 0.4 MG PO CAPS: 0.4000 mg | ORAL_CAPSULE | Freq: Every day | ORAL | 3 refills | Status: DC

## 2018-07-22 NOTE — Progress Notes (Signed)
Rx for Tamsulosin received from [facility], rx sent electronically.

## 2018-07-26 ENCOUNTER — Other Ambulatory Visit: Payer: Medicare Other

## 2018-07-28 ENCOUNTER — Ambulatory Visit: Payer: Medicare Other | Admitting: Urology

## 2018-08-20 ENCOUNTER — Encounter: Payer: Self-pay | Admitting: Urology

## 2018-08-20 ENCOUNTER — Telehealth: Payer: Self-pay | Admitting: Urology

## 2018-08-20 ENCOUNTER — Other Ambulatory Visit
Admission: RE | Admit: 2018-08-20 | Discharge: 2018-08-20 | Disposition: A | Discharge status: Home / Self Care | Payer: Medicare Other | Source: Ambulatory Visit | Attending: Urology | Admitting: Urology

## 2018-08-20 ENCOUNTER — Ambulatory Visit: Payer: Medicare Other | Admitting: Urology

## 2018-08-20 ENCOUNTER — Ambulatory Visit (INDEPENDENT_AMBULATORY_CARE_PROVIDER_SITE_OTHER): Payer: Medicare Other | Admitting: Urology

## 2018-08-20 VITALS — BP 135/65 | HR 71 | Ht 71.0 in | Wt 282.0 lb

## 2018-08-20 DIAGNOSIS — N529 Male erectile dysfunction, unspecified: Secondary | ICD-10-CM

## 2018-08-20 DIAGNOSIS — N401 Enlarged prostate with lower urinary tract symptoms: Secondary | ICD-10-CM

## 2018-08-20 DIAGNOSIS — N138 Other obstructive and reflux uropathy: Secondary | ICD-10-CM | POA: Insufficient documentation

## 2018-08-20 LAB — PSA: Prostatic Specific Antigen: 0.32 ng/mL (ref 0.00–4.00)

## 2018-08-20 MED ORDER — FINASTERIDE 5 MG PO TABS: 5.0000 mg | ORAL_TABLET | Freq: Every day | ORAL | 3 refills | Status: DC

## 2018-08-20 NOTE — Progress Notes (Signed)
08/20/2018 9:38 AM   James [name] 1946-10-20 [id number]  Referring provider: Mickey Farber, MD [address] [facility] [place], [place] [address]  Chief Complaint  Patient presents with  . Benign Prostatic Hypertrophy    follow up    HPI: Patient is a 72 year old Caucasian male with erectile dysfunction and BPH with LUTS who presents today for one year recheck.   BPH with LUTS His IPSS score today is 7, which is mild lower urinary tract symptomatology.  He is mostly satisfied with his quality life due to his urinary symptoms.   His previous IPSS score was 4/2.  He has no complaints at this visit.  He denies any dysuria, hematuria or suprapubic pain.   He currently taking tamsulosin 0.4 mg daily and finasteride 5 mg daily.  He also denies any recent UTI's, fevers, chills, nausea or vomiting.  He does not have a family history of PCa. IPSS    Row Name 08/20/18 0900         International Prostate Symptom Score   How often have you had the sensation of not emptying your bladder?  Less than 1 in 5     How often have you had to urinate less than every two hours?  Less than 1 in 5 times     How often have you found you stopped and started again several times when you urinated?  Less than 1 in 5 times     How often have you found it difficult to postpone urination?  Less than 1 in 5 times     How often have you had a weak urinary stream?  Less than 1 in 5 times     How often have you had to strain to start urination?  Less than 1 in 5 times     How many times did you typically get up at night to urinate?  1 Time     Total IPSS Score  7       Quality of Life due to urinary symptoms   If you were to spend the rest of your life with your urinary condition just the way it is now how would you feel about that?  Mostly Satisfied        Score:  1-7 Mild 8-19 Moderate 20-35 Severe   Erectile dysfunction His SHIM score is 6, which is severe ED.   His previous SHIM score  was 5.  He has been having difficulty with erections for several years.   His major complaint is maintaining erections.  His libido is preserved.   His risk factors for ED are BPH, hypertension, advanced age, history of stroke, MI and memory loss .  He denies any painful erections or curvatures with his erections.   He has tried PDE 5 inhibitors and they are effective, but he finds them cost prohibitive.  He is currently taking sildenafil 20 mg once daily.  Testosterone level was 404 in 08/07/2016 and his TSH was 3.510.   SHIM    Row Name 08/20/18 6578         SHIM: Over the last 6 months:   How do you rate your confidence that you could get and keep an erection?  Low     When you had erections with sexual stimulation, how often were your erections hard enough for penetration (entering your partner)?  Almost Never or Never     During sexual intercourse, how often were you able to maintain your  erection after you had penetrated (entered) your partner?  Almost Never or Never     During sexual intercourse, how difficult was it to maintain your erection to completion of intercourse?  Extremely Difficult     When you attempted sexual intercourse, how often was it satisfactory for you?  Almost Never or Never       SHIM Total Score   SHIM  6        Score: 1-7 Severe ED 8-11 Moderate ED 12-16 Mild-Moderate ED 17-21 Mild ED 22-25 No ED   PMH: Past Medical History:  Diagnosis Date  . BPH (benign prostatic hyperplasia)   . Erectile dysfunction   . Erectile dysfunction   . Heart attack (HCC)   . Hypogonadism in male   . Stroke [facility])     Surgical History: Past Surgical History:  Procedure Laterality Date  . abdominal cyst surgery    . cardiac bypass     3 stents puts in    Home Medications:  Allergies as of 08/20/2018   No Known Allergies     Medication List        Accurate as of 08/20/18  9:38 AM. Always use your most recent med list.          allopurinol 100 MG  tablet Commonly known as:  ZYLOPRIM Take by mouth.   aspirin 81 MG tablet Take 81 mg by mouth daily.   atorvastatin 80 MG tablet Commonly known as:  LIPITOR Take 1 tablet by mouth.   colchicine 0.6 MG tablet Take 2 tablets (1.2mg ) by mouth at first sign of gout flare followed by 1 tablet (0.6mg ) after 1 hour. (Max 1.8mg  within 1 hour)   donepezil 10 MG tablet Commonly known as:  ARICEPT TAKE 1 TABLET BY MOUTH EVERY DAY AT NIGHT   finasteride 5 MG tablet Commonly known as:  PROSCAR Take 1 tablet (5 mg total) by mouth daily.   metFORMIN 500 MG tablet Commonly known as:  GLUCOPHAGE Take by mouth.   metoprolol succinate 50 MG 24 hr tablet Commonly known as:  TOPROL-XL Take by mouth.   nitroGLYCERIN 0.4 MG SL tablet Commonly known as:  NITROSTAT Place under the tongue.   RA VITAMIN B-12 TR 1000 MCG Tbcr Generic drug:  Cyanocobalamin Take by mouth daily.   cyanocobalamin 1000 MCG/ML injection Commonly known as:  (VITAMIN B-12) Inject into the muscle.   ramipril 10 MG capsule Commonly known as:  ALTACE Take 10 mg by mouth daily.   sertraline 50 MG tablet Commonly known as:  ZOLOFT Take 50 mg by mouth daily.   sildenafil 20 MG tablet Commonly known as:  REVATIO Take 1 tablet (20 mg total) by mouth 3 (three) times daily.   tamsulosin 0.4 MG Caps capsule Commonly known as:  FLOMAX Take 1 capsule (0.4 mg total) by mouth daily after supper.   ticagrelor 90 MG Tabs tablet Commonly known as:  BRILINTA Take by mouth.   Vitamin D (Cholecalciferol) 1000 units Caps Take by mouth daily.       Allergies: No Known Allergies  Family History: Family History  Problem Relation Age of Onset  . Kidney disease Neg Hx   . Prostate cancer Neg Hx   . Bladder Cancer Neg Hx   . Kidney cancer Neg Hx     Social History:  reports that he quit smoking about 33 years ago. He has a 37.50 pack-year smoking history. He has never used smokeless tobacco. He reports that he does  not drink alcohol or use drugs.  ROS: UROLOGY Frequent Urination?: No Hard to postpone urination?: No Burning/pain with urination?: No Get up at night to urinate?: No Leakage of urine?: No Urine stream starts and stops?: No Trouble starting stream?: No Do you have to strain to urinate?: No Blood in urine?: No Urinary tract infection?: No Sexually transmitted disease?: No Injury to kidneys or bladder?: No Painful intercourse?: No Weak stream?: No Erection problems?: No Penile pain?: No  Gastrointestinal Nausea?: No Vomiting?: No Indigestion/heartburn?: No Diarrhea?: No Constipation?: No  Constitutional Fever: No Night sweats?: No Weight loss?: No Fatigue?: No  Skin Skin rash/lesions?: No Itching?: No  Eyes Blurred vision?: No Double vision?: No  Ears/Nose/Throat Sore throat?: No Sinus problems?: No  Hematologic/Lymphatic Swollen glands?: No Easy bruising?: No  Cardiovascular Leg swelling?: No Chest pain?: No  Respiratory Cough?: No Shortness of breath?: No  Endocrine Excessive thirst?: No  Musculoskeletal Back pain?: No Joint pain?: No  Neurological Headaches?: No Dizziness?: No  Psychologic Depression?: No Anxiety?: No  Physical Exam: BP 135/65   Pulse 71   Ht 5\' 11"  (1.803 m)   Wt 282 lb (127.9 kg)   BMI 39.33 kg/m   Constitutional: Well nourished. Alert and oriented, No acute distress. HEENT: [place] AT, moist mucus membranes. Trachea midline, no masses. Cardiovascular: No clubbing, cyanosis, or edema. Respiratory: Normal respiratory effort, no increased work of breathing. GI: Abdomen is obese, soft, non tender and non distended.     GU: No CVA tenderness.  No bladder fullness or masses.  Patient with uncircumcised buried phallus. Foreskin easily retracted.   Urethral meatus is patent.  No penile discharge. No penile lesions or rashes. Scrotum without lesions, cysts, rashes and/or edema.  Testicles are located scrotally bilaterally.  No masses are appreciated in the testicles. Left and right epididymis are normal. Rectal: Patient with  normal sphincter tone. Anus and perineum without scarring or rashes. No rectal masses are appreciated. Prostate is approximately 45 grams, irregular, no nodules are appreciated. Seminal vesicles are normal. Skin: No rashes, bruises or suspicious lesions. Lymph: No cervical or inguinal adenopathy. Neurologic: Grossly intact, no focal deficits, moving all 4 extremities. Psychiatric: Normal mood and affect.  Laboratory Data: Lab Results  Component Value Date   WBC 5.4 04/18/2014   HGB 14.0 04/18/2014   HCT 41.5 04/18/2014   MCV 85 04/18/2014   PLT 149 (L) 04/18/2014   PSA history:  1.4 ng/mL on 12/22/2012             1.3 ng/mL on 07/04/2013  1.5 ng/mL on 07/04/2014  1.3 ng/mL on 08/00/2016  0.5 ng/mL on 07/14/2016  0.3 ng/mL on 07/22/2017  0.32 ng/mL on 08/20/2018 I have reviewed the labs.   Assessment & Plan:    1. BPH with LUTS IPSS score is 7/2, it is slightly worse Continue conservative management, avoiding bladder irritants and timed voiding's Continue tamsulosin 0.4 mg daily and finasteride 5 mg daily; meds refilled  RTC in 12 months for IPSS, PSA and exam   2. Erectile dysfunction:   SHIM score is 6, it is slightly improved  Will prescribe sildenafil once we get clearance from cardiology as he has access to nitroglycerin tablets  RTC in 12 months for repeat SHIM score and exam  Return in about 1 year (around 08/21/2019) for IPSS, SHIM, PSA and exam.  Michiel Cowboy, [facility]  [facility] [address] [place], [place] [address] [phone]

## 2018-08-20 NOTE — Telephone Encounter (Signed)
All offices are closed will try again Monday.

## 2018-08-20 NOTE — Telephone Encounter (Signed)
Would you contact Dr. Darrold Junker' office for cardiac clearance to take sildenafil while having access to nitroglycerin tablets?

## 2018-08-23 NOTE — Telephone Encounter (Signed)
LMOM notified PSA stable. Left message with Parascho's nurse to have clearance for Sildenafil.

## 2018-08-24 MED ORDER — SILDENAFIL CITRATE 20 MG PO TABS: 20.0000 mg | ORAL_TABLET | Freq: Three times a day (TID) | ORAL | 3 refills | Status: DC

## 2018-08-24 NOTE — Telephone Encounter (Signed)
Sandy from Dr. Darrold Junker office called stating it is fine that he is on Sildenafil. Rx sent to pharmacy

## 2019-07-27 ENCOUNTER — Other Ambulatory Visit: Payer: Self-pay | Admitting: Urology

## 2019-07-27 DIAGNOSIS — N138 Other obstructive and reflux uropathy: Secondary | ICD-10-CM

## 2019-08-19 ENCOUNTER — Other Ambulatory Visit: Payer: Self-pay | Admitting: *Deleted

## 2019-08-19 DIAGNOSIS — I255 Ischemic cardiomyopathy: Secondary | ICD-10-CM | POA: Insufficient documentation

## 2019-08-19 DIAGNOSIS — N138 Other obstructive and reflux uropathy: Secondary | ICD-10-CM

## 2019-08-19 NOTE — Progress Notes (Signed)
Error

## 2019-08-20 ENCOUNTER — Other Ambulatory Visit: Payer: Self-pay | Admitting: Urology

## 2019-08-20 DIAGNOSIS — N138 Other obstructive and reflux uropathy: Secondary | ICD-10-CM

## 2019-08-20 DIAGNOSIS — N401 Enlarged prostate with lower urinary tract symptoms: Secondary | ICD-10-CM

## 2019-08-22 ENCOUNTER — Other Ambulatory Visit
Admission: RE | Admit: 2019-08-22 | Discharge: 2019-08-22 | Disposition: A | Discharge status: Home / Self Care | Payer: Medicare Other | Attending: Urology | Admitting: Urology

## 2019-08-22 ENCOUNTER — Encounter: Payer: Self-pay | Admitting: Urology

## 2019-08-22 ENCOUNTER — Other Ambulatory Visit: Payer: Self-pay

## 2019-08-22 ENCOUNTER — Ambulatory Visit: Payer: Medicare Other | Admitting: Urology

## 2019-08-22 ENCOUNTER — Other Ambulatory Visit: Payer: Self-pay | Admitting: *Deleted

## 2019-08-22 VITALS — BP 156/71 | HR 83 | Ht 71.0 in | Wt 260.0 lb

## 2019-08-22 DIAGNOSIS — N401 Enlarged prostate with lower urinary tract symptoms: Secondary | ICD-10-CM

## 2019-08-22 DIAGNOSIS — N529 Male erectile dysfunction, unspecified: Secondary | ICD-10-CM | POA: Diagnosis not present

## 2019-08-22 DIAGNOSIS — N138 Other obstructive and reflux uropathy: Secondary | ICD-10-CM | POA: Diagnosis not present

## 2019-08-22 LAB — PSA: Prostatic Specific Antigen: 0.19 ng/mL (ref 0.00–4.00)

## 2019-08-22 MED ORDER — FINASTERIDE 5 MG PO TABS: 5.0000 mg | ORAL_TABLET | Freq: Every day | ORAL | 3 refills | Status: DC

## 2019-08-22 MED ORDER — TAMSULOSIN HCL 0.4 MG PO CAPS: 0.4000 mg | ORAL_CAPSULE | Freq: Every day | ORAL | 3 refills | Status: DC

## 2019-08-22 NOTE — Addendum Note (Signed)
Addended by: Kyra Manges on: 08/22/2019 08:58 AM   Modules accepted: Orders

## 2019-08-22 NOTE — Progress Notes (Signed)
08/22/2019 8:52 AM   James [name] [birth year] [id number]  Referring provider: Ezequiel Kayser, MD [facility] [place],  [place] [address]  Chief Complaint  Patient presents with  . Benign Prostatic Hypertrophy    HPI: Patient is a 73 year old male with erectile dysfunction and BPH with LUTS who presents today for one year recheck with his wife, James [name].     BPH with LUTS His IPSS score today is 3, which is mild lower urinary tract symptomatology. He is mostly satisfied with his quality life due to his urinary symptoms.   His previous IPSS score was 7/2.  He has no complaints at this visit.  He denies any dysuria, hematuria or suprapubic pain.   He currently taking tamsulosin 0.4 mg daily and finasteride 5 mg daily.  He also denies any recent UTI's, fevers, chills, nausea or vomiting.  He does not have a family history of PCa. IPSS    Row Name 08/22/19 0800         International Prostate Symptom Score   How often have you had the sensation of not emptying your bladder?  Not at All     How often have you had to urinate less than every two hours?  Not at All     How often have you found you stopped and started again several times when you urinated?  Not at All     How often have you found it difficult to postpone urination?  Less than 1 in 5 times     How often have you had a weak urinary stream?  Not at All     How often have you had to strain to start urination?  Not at All     How many times did you typically get up at night to urinate?  2 Times     Total IPSS Score  3       Quality of Life due to urinary symptoms   If you were to spend the rest of your life with your urinary condition just the way it is now how would you feel about that?  Mostly Satisfied        Score:  1-7 Mild 8-19 Moderate 20-35 Severe   Erectile dysfunction His SHIM score is 5, which is severe ED.   His previous SHIM score was 6.  He has been having difficulty with  erections for several years.   His major complaint is maintaining erections.  His libido is preserved.   His risk factors for ED are BPH, hypertension, advanced age, history of stroke, MI and memory loss .  He denies any painful erections or curvatures with his erections.   He has tried PDE 5 inhibitors and they are effective, but he finds them cost prohibitive.  He is currently taking sildenafil 20 mg once daily.  Testosterone level was 404 in 08/07/2016 and his TSH was 3.510.   SHIM    Row Name 08/22/19 [phone]         SHIM: Over the last 6 months:   How do you rate your confidence that you could get and keep an erection?  Very Low     When you had erections with sexual stimulation, how often were your erections hard enough for penetration (entering your partner)?  Almost Never or Never     During sexual intercourse, how often were you able to maintain your erection after you had penetrated (entered) your partner?  Almost Never or Never  During sexual intercourse, how difficult was it to maintain your erection to completion of intercourse?  Extremely Difficult     When you attempted sexual intercourse, how often was it satisfactory for you?  Almost Never or Never       SHIM Total Score   SHIM  5        Score: 1-7 Severe ED 8-11 Moderate ED 12-16 Mild-Moderate ED 17-21 Mild ED 22-25 No ED   PMH: Past Medical History:  Diagnosis Date  . BPH (benign prostatic hyperplasia)   . Erectile dysfunction   . Erectile dysfunction   . Heart attack (HCC)   . Hypogonadism in male   . Stroke [facility])     Surgical History: Past Surgical History:  Procedure Laterality Date  . abdominal cyst surgery    . cardiac bypass     3 stents puts in    Home Medications:  Allergies as of 08/22/2019   No Known Allergies     Medication List       Accurate as of August 22, 2019  8:52 AM. If you have any questions, ask your nurse or doctor.        allopurinol 100 MG tablet Commonly known as:  ZYLOPRIM Take by mouth.   aspirin 81 MG tablet Take 81 mg by mouth daily.   atorvastatin 80 MG tablet Commonly known as: LIPITOR Take 1 tablet by mouth.   colchicine 0.6 MG tablet Take 2 tablets (1.2mg ) by mouth at first sign of gout flare followed by 1 tablet (0.6mg ) after 1 hour. (Max 1.8mg  within 1 hour)   diphenhydrAMINE 25 mg capsule Commonly known as: BENADRYL Take by mouth.   donepezil 10 MG tablet Commonly known as: ARICEPT TAKE 1 TABLET BY MOUTH EVERY DAY AT NIGHT   finasteride 5 MG tablet Commonly known as: PROSCAR Take 1 tablet (5 mg total) by mouth daily.   metFORMIN 500 MG tablet Commonly known as: GLUCOPHAGE Take by mouth.   metoprolol succinate 50 MG 24 hr tablet Commonly known as: TOPROL-XL Take by mouth.   nitroGLYCERIN 0.4 MG SL tablet Commonly known as: NITROSTAT Place under the tongue.   RA Vitamin B-12 TR 1000 MCG Tbcr Generic drug: Cyanocobalamin Take by mouth.   cyanocobalamin 1000 MCG/ML injection Commonly known as: (VITAMIN B-12) Inject into the muscle.   ramipril 10 MG capsule Commonly known as: ALTACE Take 10 mg by mouth daily.   sertraline 50 MG tablet Commonly known as: ZOLOFT Take 50 mg by mouth daily.   sildenafil 20 MG tablet Commonly known as: REVATIO Take 1 tablet (20 mg total) by mouth 3 (three) times daily.   tamsulosin 0.4 MG Caps capsule Commonly known as: FLOMAX TAKE 1 CAPSULE (0.4 MG TOTAL) BY MOUTH DAILY AFTER SUPPER.   Vitamin D (Cholecalciferol) 25 MCG (1000 UT) Caps Take by mouth daily.       Allergies: No Known Allergies  Family History: Family History  Problem Relation Age of Onset  . Kidney disease Neg Hx   . Prostate cancer Neg Hx   . Bladder Cancer Neg Hx   . Kidney cancer Neg Hx     Social History:  reports that he quit smoking about 34 years ago. He has a 37.50 pack-year smoking history. He has never used smokeless tobacco. He reports that he does not drink alcohol or use drugs.  ROS:  UROLOGY Frequent Urination?: No Hard to postpone urination?: No Burning/pain with urination?: No Get up at night to urinate?: No Leakage  of urine?: No Urine stream starts and stops?: No Trouble starting stream?: No Do you have to strain to urinate?: No Blood in urine?: No Urinary tract infection?: No Sexually transmitted disease?: No Injury to kidneys or bladder?: No Painful intercourse?: No Weak stream?: No Erection problems?: Yes Penile pain?: No  Gastrointestinal Nausea?: No Vomiting?: No Indigestion/heartburn?: No Diarrhea?: No Constipation?: No  Constitutional Fever: No Night sweats?: No Weight loss?: No Fatigue?: Yes  Skin Skin rash/lesions?: Yes Itching?: Yes  Eyes Blurred vision?: No Double vision?: No  Ears/Nose/Throat Sore throat?: No Sinus problems?: No  Hematologic/Lymphatic Swollen glands?: No Easy bruising?: No  Cardiovascular Leg swelling?: No Chest pain?: No  Respiratory Cough?: No Shortness of breath?: No  Endocrine Excessive thirst?: No  Musculoskeletal Back pain?: No Joint pain?: No  Neurological Headaches?: No Dizziness?: No  Psychologic Depression?: No Anxiety?: No  Physical Exam: BP (!) 156/71   Pulse 83   Ht 5\' 11"  (1.803 m)   Wt 260 lb (117.9 kg)   BMI 36.26 kg/m   Constitutional:  Well nourished. Alert and oriented, No acute distress. HEENT: [place] AT, moist mucus membranes.  Trachea midline, no masses. Cardiovascular: No clubbing, cyanosis, or edema. Respiratory: Normal respiratory effort, no increased work of breathing. GI: Abdomen is soft, non tender, non distended, no abdominal masses. Liver and spleen not palpable.  No hernias appreciated.  Stool sample for occult testing is not indicated.   GU: No CVA tenderness.  No bladder fullness or masses.  Patient with uncircumcised, buried phallus.  Foreskin easily retracted.  Urethral meatus is patent.  No penile discharge. No penile lesions or rashes. Scrotum  without lesions, cysts, rashes and/or edema.  Testicles are located scrotally bilaterally. No masses are appreciated in the testicles. Left and right epididymis are normal. Rectal: Patient with  normal sphincter tone. Anus and perineum without scarring or rashes. No rectal masses are appreciated. Prostate is approximately 50 grams, could only palpate the apex and the midportion of the gland, no nodules are appreciated. Seminal vesicles could not be palpated.  Skin: No rashes, bruises or suspicious lesions. Lymph: No inguinal adenopathy. Neurologic: Grossly intact, no focal deficits, moving all 4 extremities. Psychiatric: Normal mood and affect.  Laboratory Data: Lab Results  Component Value Date   WBC 5.4 04/18/2014   HGB 14.0 04/18/2014   HCT 41.5 04/18/2014   MCV 85 04/18/2014   PLT 149 (L) 04/18/2014   PSA history:  1.4 ng/mL on 12/22/2012             1.3 ng/mL on 07/04/2013  1.5 ng/mL on 07/04/2014  1.3 ng/mL on 08/00/2016  0.5 ng/mL on 07/14/2016  0.3 ng/mL on 07/22/2017  0.32 ng/mL on 08/20/2018 I have reviewed the labs.   Assessment & Plan:    1. BPH with LUTS IPSS score is 3/2, it is improved Continue conservative management, avoiding bladder irritants and timed voiding's Continue tamsulosin 0.4 mg daily and finasteride 5 mg daily; meds refilled  PSA pending  RTC in 12 months for I PSS, PSA and exam - if PSA stable   2. Erectile dysfunction:   SHIM score is 5, it is worse Will prescribe sildenafil once we get clearance from cardiology as he has access to nitroglycerin tablets  RTC in 12 months for repeat SHIM score and exam  Return in about 1 year (around 08/21/2020) for IPSS, SHIM, PSA and exam.  Michiel CowboySHANNON Tee Richeson, [facility]  [facility] [address] [place], [place] [id number] [phone]

## 2019-08-23 ENCOUNTER — Telehealth: Payer: Self-pay | Admitting: *Deleted

## 2019-08-23 NOTE — Telephone Encounter (Signed)
Notified patient as instructed, patient pleased. Discussed follow-up appointments, patient agrees  

## 2019-08-23 NOTE — Telephone Encounter (Signed)
-----   Message from Nori Riis, PA-C sent at 08/23/2019  7:47 AM EDT ----- Please let Mr. Achterberg know that his PSA is normal and we will see him next year.

## 2019-09-20 DIAGNOSIS — I872 Venous insufficiency (chronic) (peripheral): Secondary | ICD-10-CM | POA: Insufficient documentation

## 2019-09-20 DIAGNOSIS — L308 Other specified dermatitis: Secondary | ICD-10-CM | POA: Insufficient documentation

## 2020-02-24 DIAGNOSIS — R269 Unspecified abnormalities of gait and mobility: Secondary | ICD-10-CM | POA: Insufficient documentation

## 2020-07-30 ENCOUNTER — Ambulatory Visit (INDEPENDENT_AMBULATORY_CARE_PROVIDER_SITE_OTHER): Payer: Medicare Other | Admitting: Internal Medicine

## 2020-07-30 ENCOUNTER — Other Ambulatory Visit: Payer: Self-pay

## 2020-07-30 VITALS — BP 126/65 | HR 89 | Ht 71.0 in | Wt 279.0 lb

## 2020-07-30 DIAGNOSIS — I10 Essential (primary) hypertension: Secondary | ICD-10-CM | POA: Diagnosis not present

## 2020-07-30 DIAGNOSIS — Z6841 Body Mass Index (BMI) 40.0 and over, adult: Secondary | ICD-10-CM

## 2020-07-30 DIAGNOSIS — G4733 Obstructive sleep apnea (adult) (pediatric): Secondary | ICD-10-CM | POA: Diagnosis not present

## 2020-07-30 NOTE — Progress Notes (Signed)
[facility] [address] [place], [place] [address]  Pulmonary Sleep Medicine   Office Visit Note  Patient Name: James [name] DOB: 1946/03/20 MRN [id number]    Chief Complaint: Obstructive Sleep Apnea visit  Brief History:  Aliou is seen today for initial consultation The patient has a 25 year history of sleep apnea. Patient is  using PAP nightly.  The patient feels more rested after sleeping with PAP.  The patient reports benefiting from PAP use. Reported sleepiness is  Improved, however he sleeps quite a bit following his stroke and MI. The Epworth Sleepiness Score is 3 out of 24. The patient does take naps with his CPAP for up to 3 hours per day. The patient complains of the following: mask leak  The compliance download shows excellent compliance with an average use time of 12 hours. The AHI is 5.8. He has gained 19 lbs since his pressure was set.  The patient does not complain of limb movements disrupting sleep.  ROS  General: (-) fever, (-) chills, (-) night sweat Nose and Sinuses: (-) nasal stuffiness or itchiness, (-) postnasal drip, (-) nosebleeds, (-) sinus trouble. Mouth and Throat: (-) sore throat, (-) hoarseness. Neck: (-) swollen glands, (-) enlarged thyroid, (-) neck pain. Respiratory: - cough, - shortness of breath, - wheezing. Neurologic: - numbness, - tingling. Psychiatric: - anxiety, - depression   Current Medication: Outpatient Encounter Medications as of 07/30/2020  Medication Sig Note  . allopurinol (ZYLOPRIM) 100 MG tablet Take by mouth.   Marland Kitchen aspirin 81 MG tablet Take 81 mg by mouth daily.   Marland Kitchen atorvastatin (LIPITOR) 80 MG tablet Take 1 tablet by mouth. 10/24/2015: Received from: [facility] Received Sig: Take by mouth.  . colchicine 0.6 MG tablet Take 2 tablets (1.2mg ) by mouth at first sign of gout flare followed by 1 tablet (0.6mg ) after 1 hour. (Max 1.8mg  within 1 hour)   . cyanocobalamin (,VITAMIN B-12,) 1000 MCG/ML  injection Inject into the muscle. 07/25/2016: Received from: [facility] Received Sig: Inject into the muscle monthly.  . Cyanocobalamin (RA VITAMIN B-12 TR) 1000 MCG TBCR Take by mouth.  10/24/2015: Received from: [facility]  . diphenhydrAMINE (BENADRYL) 25 mg capsule Take by mouth.   . donepezil (ARICEPT) 10 MG tablet TAKE 1 TABLET BY MOUTH EVERY DAY AT NIGHT   . finasteride (PROSCAR) 5 MG tablet Take 1 tablet (5 mg total) by mouth daily.   . metFORMIN (GLUCOPHAGE) 500 MG tablet Take by mouth.   . metoprolol succinate (TOPROL-XL) 50 MG 24 hr tablet Take by mouth. 10/24/2015: Received from: [facility]  . nitroGLYCERIN (NITROSTAT) 0.4 MG SL tablet Place under the tongue. 07/25/2016: Received from: [facility] Received Sig: Place 1 tablet (0.4 mg total) under the tongue every 5 (five) minutes as needed for Chest pain. May take up to 3 doses.  . ramipril (ALTACE) 10 MG capsule Take 10 mg by mouth daily.   . sertraline (ZOLOFT) 50 MG tablet Take 50 mg by mouth daily.   . sildenafil (REVATIO) 20 MG tablet Take 1 tablet (20 mg total) by mouth 3 (three) times daily.   . tamsulosin (FLOMAX) 0.4 MG CAPS capsule Take 1 capsule (0.4 mg total) by mouth daily after supper.   . Vitamin D, Cholecalciferol, 1000 UNITS CAPS Take by mouth daily.    No facility-administered encounter medications on file as of 07/30/2020.    Surgical History: Past Surgical History:  Procedure Laterality  Date  . abdominal cyst surgery    . cardiac bypass     3 stents puts in    Medical History: Past Medical History:  Diagnosis Date  . BPH (benign prostatic hyperplasia)   . Erectile dysfunction   . Erectile dysfunction   . Heart attack (HCC)   . Hypogonadism in male   . Stroke [facility])     Family History: Non contributory to the present illness  Social History: Social History   Socioeconomic History  . Marital status: Married    Spouse name: Not  on file  . Number of children: Not on file  . Years of education: Not on file  . Highest education level: Not on file  Occupational History  . Not on file  Tobacco Use  . Smoking status: Former Smoker    Packs/day: 1.50    Years: 25.00    Pack years: 30.50    Quit date: 11/17/1984    Years since quitting: 35.7  . Smokeless tobacco: Never Used  . Tobacco comment: "years ago"  Substance and Sexual Activity  . Alcohol use: No    Alcohol/week: 0.0 standard drinks  . Drug use: No  . Sexual activity: Not on file  Other Topics Concern  . Not on file  Social History Narrative  . Not on file   Social Determinants of Health   Financial Resource Strain:   . Difficulty of Paying Living Expenses: Not on file  Food Insecurity:   . Worried About Programme researcher, broadcasting/film/video in the Last Year: Not on file  . Ran Out of Food in the Last Year: Not on file  Transportation Needs:   . Lack of Transportation (Medical): Not on file  . Lack of Transportation (Non-Medical): Not on file  Physical Activity:   . Days of Exercise per Week: Not on file  . Minutes of Exercise per Session: Not on file  Stress:   . Feeling of Stress : Not on file  Social Connections:   . Frequency of Communication with Friends and Family: Not on file  . Frequency of Social Gatherings with Friends and Family: Not on file  . Attends Religious Services: Not on file  . Active Member of Clubs or Organizations: Not on file  . Attends Banker Meetings: Not on file  . Marital Status: Not on file  Intimate Partner Violence:   . Fear of Current or Ex-Partner: Not on file  . Emotionally Abused: Not on file  . Physically Abused: Not on file  . Sexually Abused: Not on file    Vital Signs: There were no vitals taken for this visit.  Examination: General Appearance: The patient is well-developed, well-nourished, and in no distress. Neck Circumference: 48 Skin: Gross inspection of skin unremarkable. Head: normocephalic,  no gross deformities. Eyes: no gross deformities noted. ENT: ears appear grossly normal Neurologic: Alert and oriented. No involuntary movements.    EPWORTH SLEEPINESS SCALE:  Scale:  (0)= no chance of dozing; (1)= slight chance of dozing; (2)= moderate chance of dozing; (3)= high chance of dozing  Chance  Situtation    Sitting and reading: 0    Watching TV: 0    Sitting Inactive in public: 0    As a passenger in car: 0      Lying down to rest: 3    Sitting and talking: 0    Sitting quielty after lunch: 0    In a car, stopped in traffic: 0   TOTAL  SCORE:   3 out of 24    SLEEP STUDIES:  1. Split 07/27/10 AHI 29 SpO15min 88%, 8 cm H2O   CPAP COMPLIANCE DATA:  Date Range: 06/29/20-07/29/20  Average Daily Use: 12 hours  Median Use: 12.3  Compliance for > 4 Hours: 100%  AHI: 5.8 respiratory events per hour  Days Used: 30/30  Mask Leak: 38.3  95th Percentile Pressure: 8   LABS: No results found for this or any previous visit (from the past 2160 hour(s)).  Radiology: No results found.  Assessment and Plan: Patient Active Problem List   Diagnosis Date Noted  . Ischemic cardiomyopathy 08/19/2019  . 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 BMI 40.0-44.9, adult (HCC) 08/14/2017  . 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 Gout 10/24/2015  . Acute myocardial infarction of anterior wall (HCC) 10/10/2015  . Acute blood loss anemia 09/22/2015  . Ecchymosis 09/22/2015  . Acute systolic heart failure (HCC) 09/21/2015  . CAD in native artery 09/21/2015  . Frank hematuria 09/21/2015  . ST elevation myocardial infarction (STEMI) (HCC) 09/21/2015  . Avitaminosis D 08/15/2015  . 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  . 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  . Other long term (current) drug therapy 02/20/2015  . Mild cognitive disorder 01/25/2015  . Mild cognitive impairment 01/25/2015  . Chest pain  09/20/2014  . Breathlessness on exertion 09/20/2014  . H/O transient cerebral ischemia 09/20/2014  . B12 deficiency 08/13/2014  . 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 ED (erectile dysfunction) of organic origin 08/13/2014  . Benign essential HTN 08/13/2014  . Hypercholesterolemia 08/13/2014  . 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 Obstructive apnea 08/13/2014  . Pancytopenia (HCC) 08/13/2014  . Type 2 diabetes mellitus (HCC) 08/13/2014  . Hypertension associated with type 2 diabetes mellitus (HCC) 08/13/2014      The patient doe tolerate PAP and reports significant benefit from PAP use. The patient was reminded how to clean the machine. The patient was also counselled on importance of weight control and regular exercise was advised. The compliance is excellent. The AHI is slightly elevated and the pressure was increased to 10 cm H2O today..   1. OSA on CPAP-Continue to use CPAP at current pressures and with nightly compliance. 2. Obesity-discussed the importance of weight management. Discussed the need to focus on healthy eating habits as well as incorporating exercise into his daily routine. 3. HTN-BP and HR well controlled today.  General Counseling: I have discussed the findings of the evaluation and examination with Iantha Fallen.  I have also discussed any further diagnostic evaluation thatmay be needed or ordered today. Jade verbalizes understanding of the findings of todays visit. We also reviewed his medications today and discussed drug interactions and side effects including but not limited excessive drowsiness and altered mental states. We also discussed that there is always a risk not just to him but also people around him. he has been encouraged to call the office with any questions or concerns that should arise related to todays visit.  This patient was seen by Leeanne Deed AGNP-C in Collaboration with Dr. Freda Munro as a part of collaborative care  agreement.   I have personally obtained a history, examined the patient, evaluated laboratory and imaging results, formulated the assessment and plan and placed orders.   Valentino Hue Sol Blazing, PhD, FAASM  Diplomate, American Board of Sleep Medicine    Yevonne Pax, MD [facility] Diplomate ABMS Pulmonary and Critical Care Medicine  Sleep medicine

## 2020-07-30 NOTE — Patient Instructions (Signed)

## 2020-08-06 ENCOUNTER — Encounter: Payer: Self-pay | Admitting: Physical Therapy

## 2020-08-06 ENCOUNTER — Other Ambulatory Visit: Payer: Self-pay

## 2020-08-06 ENCOUNTER — Ambulatory Visit: Payer: Medicare Other | Attending: Neurology | Admitting: Physical Therapy

## 2020-08-06 DIAGNOSIS — R2681 Unsteadiness on feet: Secondary | ICD-10-CM | POA: Diagnosis present

## 2020-08-06 DIAGNOSIS — R2689 Other abnormalities of gait and mobility: Secondary | ICD-10-CM | POA: Diagnosis present

## 2020-08-06 NOTE — Therapy (Signed)
[facility] [address] [place], [place], [address] Phone: [phone]   Fax:  [phone]  Physical Therapy Evaluation  Patient Details  Name: James [name] MRN: [id number] Date of Birth: 1946-10-27 Referring Provider (PT): Dr. Cristopher Peru, MD   Encounter Date: 08/06/2020   PT End of Session - 08/06/20 1220    Visit Number 1    Number of Visits 1    PT Start Time 1110    PT Stop Time 1200    PT Time Calculation (min) 50 min    Equipment Utilized During Treatment Gait belt    Activity Tolerance Patient tolerated treatment well    Behavior During Therapy [facility] for tasks assessed/performed           Past Medical History:  Diagnosis Date  . BPH (benign prostatic hyperplasia)   . Erectile dysfunction   . Erectile dysfunction   . Heart attack (HCC)   . Hypogonadism in male   . Stroke [facility])     Past Surgical History:  Procedure Laterality Date  . abdominal cyst surgery    . cardiac bypass     3 stents puts in    There were no vitals filed for this visit.    Subjective Assessment - 08/06/20 1108    Subjective Pt is a pleasant 74 y.o. referred to PT for unsteady gait. Pt reports balance issues with quick transfers (sitting to standing) with instability and denies dizziness with more unstable episodes with longer distance walking. Pt does not wish to utilize any AD at this time due to reports of no need at this time. Pt reports no falls or near falls. Pt reports limited vision in B peripheral fields from previous stroke in 2015. Pt denies any strength deficits. Pt's home layout is a 2 story house, with single floor set up with no stairs to enter home. No difficulty with bathroom and showering ADL's. Pt reports "doing very little" because lack of intererst, not due to any deficits from [facility]. Pt's goals with PT is to become "more stable" with his walking.    Pertinent History Pt is an avid reader. Lacks interest in becoming more  active. B visual impairments in peripheral fields from stroke in 2015. Does not perform outdoor tasks like cutting grass, performing grocery shopping, etc. Balance issues with long distance walking.    Patient Stated Goals become more stable with walking.    Currently in Pain? No/denies    Multiple Pain Sites No               [facility] PT Assessment - 08/06/20 0001      Assessment   Medical Diagnosis Unsteady Gait    Referring Provider (PT) Dr. Cristopher Peru, MD    Onset Date/Surgical Date 11/18/19    Hand Dominance Right    Prior Therapy No      Precautions   Precautions None      Balance Screen   Has the patient fallen in the past 6 months No    Has the patient had a decrease in activity level because of a fear of falling?  No    Is the patient reluctant to leave their home because of a fear of falling?  No      Home Environment   Living Environment Private residence    Living Arrangements Spouse/significant other    Home Access Level entry    Home Layout Two level;Able to live on main level with bedroom/bathroom  Prior Function   Level of Independence Independent          OBJECTIVE MUSCULOSKELETAL: Tremor: Absent Bulk: Normal Tone: Normal, no clonus  Posture No gross abnormalities noted in standing or seated posture  Gait No gross abnormalities in gait noted. Pt ambulates with slightly decreased step length bilat and slight forward trunk lean.   Strength R/L 4+/4+ Hip flexion 5/5 Hip abduction (seated) 5/5 Hip adduction (seated) 5/5 Knee extension 5/5 Knee flexion 5/5 Ankle Plantarflexion 5/5 Ankle Dorsiflexion   NEUROLOGICAL: Mental Status Patient's fund of knowledge is within normal limits for educational level. Noted minor memory deficits at baseline.  Cranial Nerves Pt's peripheral vision limited bilaterally due to hx of stroke with peripheral vision testing, worse on L than R.   FUNCTIONAL OUTCOME MEASURES   Results Comments  BERG 51/56  Low falls risk  DGI 22/24 Safe community ambulator  5TSTS 19.54 seconds   6 Minute Walk Test 14.5 laps = 930' Noted SOB post test.     Objective measurements completed on examination: See above findings.   Clinical Impression: Pt. is a 74 y.o. male with unsteadiness and gait imbalance. Pt. reports history of strokes in 2009 and 2015 with resulting visual field changes. Pt. unable to see full peripheral vision, worse on L than R. Pt. demonstrates LE strength WFL bilat. Pt. completed Berg and DGI today, scoring 51/56 on the Berg and 22/24 on the DGI. Pt. completed and walked 930 feet with slightly decreased gait speed, slight forward trunk lean, and mildly decreased step length bilat, however no evidence of imbalance noted. Pt. reported he feels most unsteady upon standing from chair immediately, but it goes away very quickly. Pt. complete 5XSTS and scored 19.54s, no evidence of imbalance/unsteadiness noticed. PT and pt discussed goals for therapy, pt reported that he has no desire to walk or do any more activity than he is currently doing. With visual changes, pt reported that he does well in his own home, and when out in the community he slows down to assess the environment. With standing unsteadiness, PT instructed pt. to stand for a moment after standing to allow for unsteadiness to subside before moving. PT, pt, and pt's wife discussed not needing to return to therapy at this point in time due to no evidence of imbalance. Pt's wife and pt verbalized agreement and understanding of this decision. Pt and pt's wife were given the clinic's phone number and instructed to call if pt worsens.  Visit Diagnosis: Gait instability  Imbalance     Problem List Patient Active Problem List   Diagnosis Date Noted  . Ischemic cardiomyopathy 08/19/2019  . 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 BMI 40.0-44.9, adult (HCC) 08/14/2017  . 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 Gout  10/24/2015  . Acute myocardial infarction of anterior wall (HCC) 10/10/2015  . Acute blood loss anemia 09/22/2015  . Ecchymosis 09/22/2015  . Acute systolic heart failure (HCC) 09/21/2015  . CAD in native artery 09/21/2015  . Frank hematuria 09/21/2015  . ST elevation myocardial infarction (STEMI) (HCC) 09/21/2015  . Avitaminosis D 08/15/2015  . 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  . 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  . Other long term (current) drug therapy 02/20/2015  . Mild cognitive disorder 01/25/2015  . Mild cognitive impairment 01/25/2015  . Chest pain 09/20/2014  . Breathlessness on exertion 09/20/2014  . H/O transient cerebral ischemia 09/20/2014  . B12 deficiency 08/13/2014  . Abnormal vision  as late effect of cerebrovascular disease 08/13/2014  . ED (erectile dysfunction) of organic origin 08/13/2014  . Benign essential HTN 08/13/2014  . Hypercholesterolemia 08/13/2014  . 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 Obstructive apnea 08/13/2014  . Pancytopenia (HCC) 08/13/2014  . Type 2 diabetes mellitus (HCC) 08/13/2014  . Hypertension associated with type 2 diabetes mellitus (HCC) 08/13/2014   Cammie Mcgee, PT, DPT # [id number] Sharyn Creamer, SPT 08/06/2020, 1:20 PM  [facility] [address] [place], [place], [address] Phone: [phone]   Fax:  [phone]  Name: James [name] MRN: [id number] Date of Birth: [birth year]

## 2020-08-08 ENCOUNTER — Ambulatory Visit: Payer: Medicare Other | Admitting: Physical Therapy

## 2020-08-10 ENCOUNTER — Other Ambulatory Visit: Payer: Self-pay | Admitting: Urology

## 2020-08-10 DIAGNOSIS — N138 Other obstructive and reflux uropathy: Secondary | ICD-10-CM

## 2020-08-13 ENCOUNTER — Ambulatory Visit: Payer: Medicare Other | Admitting: Physical Therapy

## 2020-08-15 ENCOUNTER — Ambulatory Visit: Payer: Medicare Other | Admitting: Physical Therapy

## 2020-08-17 ENCOUNTER — Other Ambulatory Visit
Admission: RE | Admit: 2020-08-17 | Discharge: 2020-08-17 | Disposition: A | Discharge status: Home / Self Care | Payer: Medicare Other | Attending: Urology | Admitting: Urology

## 2020-08-17 ENCOUNTER — Other Ambulatory Visit: Payer: Self-pay

## 2020-08-17 DIAGNOSIS — N401 Enlarged prostate with lower urinary tract symptoms: Secondary | ICD-10-CM | POA: Diagnosis present

## 2020-08-17 DIAGNOSIS — N138 Other obstructive and reflux uropathy: Secondary | ICD-10-CM | POA: Diagnosis present

## 2020-08-17 LAB — PSA: Prostatic Specific Antigen: 0.16 ng/mL (ref 0.00–4.00)

## 2020-08-19 NOTE — Progress Notes (Signed)
08/20/2020 8:45 AM   James [name] January 21, 1946 [id number]  Referring provider: Mickey Farber, MD [address] [facility] [place],  [place] [address]  Chief Complaint  Patient presents with  . Follow-up    1 year follow-up with IPSS/SHIM/PSA    HPI: Patient is a 74 year old male with erectile dysfunction and BPH with LUTS who presents today for one year recheck with his wife, James [name].     BPH with LUTS His IPSS score today is 8, which is moderate lower urinary tract symptomatology.   He is pleased with his quality life due to his urinary symptoms.   His previous IPSS score was 3/2.    Main complaint: None.  Patient denies any modifying or aggravating factors.  Patient denies any gross hematuria, dysuria or suprapubic/flank pain.  Patient denies any fevers, chills, nausea or vomiting.   He currently taking tamsulosin 0.4 mg daily and finasteride 5 mg daily.     IPSS    Row Name 08/20/20 0800         International Prostate Symptom Score   How often have you had the sensation of not emptying your bladder? Not at All     How often have you had to urinate less than every two hours? About half the time     How often have you found you stopped and started again several times when you urinated? Not at All     How often have you found it difficult to postpone urination? Almost always     How often have you had a weak urinary stream? Not at All     How often have you had to strain to start urination? Not at All     How many times did you typically get up at night to urinate? None     Total IPSS Score 8       Quality of Life due to urinary symptoms   If you were to spend the rest of your life with your urinary condition just the way it is now how would you feel about that? Pleased            Score:  1-7 Mild 8-19 Moderate 20-35 Severe   Erectile dysfunction His SHIM score is 5, which is no ED.   His previous SHIM score was 5.  He has been having difficulty  with erections for several years.   His major complaint is maintaining erections.  His libido is preserved.   His risk factors for ED are BPH, hypertension, advanced age, history of stroke, MI and memory loss .  No longer sexually active.     SHIM    Row Name 08/20/20 0835         SHIM: Over the last 6 months:   How do you rate your confidence that you could get and keep an erection? Very Low     When you had erections with sexual stimulation, how often were your erections hard enough for penetration (entering your partner)? Almost Never or Never     During sexual intercourse, how often were you able to maintain your erection after you had penetrated (entered) your partner? Almost Never or Never     During sexual intercourse, how difficult was it to maintain your erection to completion of intercourse? Extremely Difficult     When you attempted sexual intercourse, how often was it satisfactory for you? Almost Never or Never       SHIM Total Score  SHIM 5            Score: 1-7 Severe ED 8-11 Moderate ED 12-16 Mild-Moderate ED 17-21 Mild ED 22-25 No ED   PMH: Past Medical History:  Diagnosis Date  . BPH (benign prostatic hyperplasia)   . Erectile dysfunction   . Erectile dysfunction   . Heart attack (HCC)   . Hypogonadism in male   . Stroke [facility](HCC)     Surgical History: Past Surgical History:  Procedure Laterality Date  . abdominal cyst surgery    . cardiac bypass     3 stents puts in    Home Medications:  Allergies as of 08/20/2020   No Known Allergies     Medication List       Accurate as of August 20, 2020  8:45 AM. If you have any questions, ask your nurse or doctor.        allopurinol 100 MG tablet Commonly known as: ZYLOPRIM Take 1 tablet by mouth daily. What changed: Another medication with the same name was removed. Continue taking this medication, and follow the directions you see here. Changed by: James CowboySHANNON Jillyan Plitt, PA-C   aspirin 81 MG  tablet Take 81 mg by mouth daily.   atorvastatin 80 MG tablet Commonly known as: LIPITOR Take 1 tablet by mouth.   colchicine 0.6 MG tablet Take 2 tablets (1.2mg ) by mouth at first sign of gout flare followed by 1 tablet (0.6mg ) after 1 hour. (Max 1.8mg  within 1 hour)   cyanocobalamin 1000 MCG/ML injection Commonly known as: (VITAMIN B-12) Inject into the muscle. What changed: Another medication with the same name was removed. Continue taking this medication, and follow the directions you see here. Changed by: James CowboySHANNON Pixie Burgener, PA-C   diphenhydrAMINE 25 mg capsule Commonly known as: BENADRYL Take by mouth.   donepezil 10 MG tablet Commonly known as: ARICEPT TAKE 1 TABLET BY MOUTH EVERY DAY AT NIGHT   finasteride 5 MG tablet Commonly known as: PROSCAR Take 1 tablet (5 mg total) by mouth daily.   memantine 10 MG tablet Commonly known as: NAMENDA Take 10 mg by mouth 2 (two) times daily.   metFORMIN 500 MG tablet Commonly known as: GLUCOPHAGE Take by mouth.   metoprolol succinate 50 MG 24 hr tablet Commonly known as: TOPROL-XL Take by mouth.   nitroGLYCERIN 0.4 MG SL tablet Commonly known as: NITROSTAT Place under the tongue as needed. What changed: Another medication with the same name was removed. Continue taking this medication, and follow the directions you see here. Changed by: James CowboySHANNON Kethan Papadopoulos, PA-C   ramipril 10 MG capsule Commonly known as: ALTACE Take 10 mg by mouth daily.   sertraline 100 MG tablet Commonly known as: ZOLOFT Take 1 tablet by mouth daily. For 90days What changed: Another medication with the same name was removed. Continue taking this medication, and follow the directions you see here. Changed by: James CowboySHANNON Kanylah Muench, PA-C   sildenafil 20 MG tablet Commonly known as: REVATIO Take 1 tablet (20 mg total) by mouth 3 (three) times daily.   tamsulosin 0.4 MG Caps capsule Commonly known as: FLOMAX Take 1 capsule (0.4 mg total) by mouth daily after  supper.   Vitamin D (Cholecalciferol) 25 MCG (1000 UT) Caps Take by mouth daily.       Allergies: No Known Allergies  Family History: Family History  Problem Relation Age of Onset  . Kidney disease Neg Hx   . Prostate cancer Neg Hx   . Bladder Cancer Neg Hx   .  Kidney cancer Neg Hx     Social History:  reports that he quit smoking about 35 years ago. He has a 37.50 pack-year smoking history. He has never used smokeless tobacco. He reports that he does not drink alcohol and does not use drugs.  ROS: For pertinent review of systems please refer to history of present illness  Physical Exam: BP (!) 154/82   Pulse 76   Ht 5\' 11"  (1.803 m)   Wt 274 lb (124.3 kg)   BMI 38.22 kg/m   Constitutional:  Well nourished. Alert and oriented, No acute distress. HEENT: [place] AT, mask in place.  Trachea midline Cardiovascular: No clubbing, cyanosis, or edema. Respiratory: Normal respiratory effort, no increased work of breathing. GU: No CVA tenderness.  No bladder fullness or masses.  Patient with uncircumcised phallus.  Foreskin easily retracted  Urethral meatus is patent.  No penile discharge. No penile lesions or rashes. Scrotum without lesions, cysts, rashes and/or edema.  Testicles are located scrotally bilaterally. No masses are appreciated in the testicles. Left and right epididymis are normal. Rectal: Patient with  normal sphincter tone. Anus and perineum without scarring or rashes. No rectal masses are appreciated. Prostate is approximately 40 grams, no nodules are appreciated. Seminal vesicles could not be palpated Lymph: No inguinal adenopathy. Neurologic: Grossly intact, no focal deficits, moving all 4 extremities. Psychiatric: Normal mood and affect.  Laboratory Data: Lab Results  Component Value Date   WBC 5.4 04/18/2014   HGB 14.0 04/18/2014   HCT 41.5 04/18/2014   MCV 85 04/18/2014   PLT 149 (L) 04/18/2014   PSA history:  1.4 ng/mL on 12/22/2012             1.3 ng/mL on  07/04/2013  1.5 ng/mL on 07/04/2014  1.3 ng/mL on 08/00/2016  0.5 ng/mL on 07/14/2016  0.3 ng/mL on 07/22/2017 Component     Latest Ref Rng & Units 08/20/2018 08/22/2019 08/17/2020  Prostatic Specific Antigen     0.00 - 4.00 ng/mL 0.32 0.19 0.16   Specimen:  Urine  Ref Range & Units 5 mo ago  Color Yellow  Yellow      Clarity Clear  Clear      Specific Gravity 1.000 - 1.030  1.025      pH, Urine 5.0 - 8.0  5.0      Protein, Urinalysis Negative, Trace mg/dL Negative      Glucose, Urinalysis Negative mg/dL Negative      Ketones, Urinalysis Negative mg/dL Negative      Blood, Urinalysis Negative  Negative      Nitrite, Urinalysis Negative  Negative      Leukocyte Esterase, Urinalysis Negative  Negative      White Blood Cells, Urinalysis None Seen, 0-3 /hpf 0-3      Red Blood Cells, Urinalysis None Seen, 0-3 /hpf 0-3      Bacteria, Urinalysis None Seen /hpf None Seen      Squamous Epithelial Cells, Urinalysis Rare, Few, None Seen /hpf None Seen      Resulting Agency  [facility] - [facility]  Specimen Collected: 02/24/20 9:03 AM Last Resulted: 02/24/20 10:57 AM  [facility]  Result Received: 07/30/20 10:40 AM     I have reviewed the labs.   Assessment & Plan:    1. BPH with LUTS IPSS score is 8/1, it is worse Continue conservative management, avoiding bladder irritants and timed voiding's Continue tamsulosin 0.4 mg daily and finasteride 5 mg daily; meds  refilled  No longer screen for prostate cancer due to age and co-mobidities RTC in 12 months for I PSS and exam   2. Erectile dysfunction:   SHIM score is 5, it is stable He is no longer sexually active.  Return in about 1 year (around 08/20/2021) for I PSS and exam .  James Cowboy, [facility]  [facility] [address] [place], [place] [address] [phone]

## 2020-08-20 ENCOUNTER — Other Ambulatory Visit: Payer: Self-pay

## 2020-08-20 ENCOUNTER — Ambulatory Visit (INDEPENDENT_AMBULATORY_CARE_PROVIDER_SITE_OTHER): Payer: Medicare Other | Admitting: Urology

## 2020-08-20 ENCOUNTER — Ambulatory Visit: Payer: Medicare Other | Admitting: Physical Therapy

## 2020-08-20 ENCOUNTER — Encounter: Payer: Self-pay | Admitting: Urology

## 2020-08-20 VITALS — BP 154/82 | HR 76 | Ht 71.0 in | Wt 274.0 lb

## 2020-08-20 DIAGNOSIS — N401 Enlarged prostate with lower urinary tract symptoms: Secondary | ICD-10-CM | POA: Diagnosis not present

## 2020-08-20 DIAGNOSIS — N138 Other obstructive and reflux uropathy: Secondary | ICD-10-CM

## 2020-08-20 DIAGNOSIS — N529 Male erectile dysfunction, unspecified: Secondary | ICD-10-CM | POA: Diagnosis not present

## 2020-08-20 MED ORDER — TAMSULOSIN HCL 0.4 MG PO CAPS: 0.4000 mg | ORAL_CAPSULE | Freq: Every day | ORAL | 3 refills | Status: DC

## 2020-08-20 MED ORDER — FINASTERIDE 5 MG PO TABS: 5.0000 mg | ORAL_TABLET | Freq: Every day | ORAL | 3 refills | Status: DC

## 2020-08-22 ENCOUNTER — Ambulatory Visit: Payer: Medicare Other | Admitting: Physical Therapy

## 2020-08-27 ENCOUNTER — Ambulatory Visit: Payer: Medicare Other | Admitting: Physical Therapy

## 2020-08-29 ENCOUNTER — Ambulatory Visit: Payer: Medicare Other | Admitting: Physical Therapy

## 2020-09-03 ENCOUNTER — Ambulatory Visit: Payer: Medicare Other | Admitting: Physical Therapy

## 2020-09-05 ENCOUNTER — Ambulatory Visit: Payer: Medicare Other | Admitting: Physical Therapy

## 2020-09-10 ENCOUNTER — Ambulatory Visit: Payer: Medicare Other | Admitting: Physical Therapy

## 2020-09-12 ENCOUNTER — Ambulatory Visit: Payer: Medicare Other | Admitting: Physical Therapy

## 2020-09-17 ENCOUNTER — Ambulatory Visit: Payer: Medicare Other | Admitting: Physical Therapy

## 2020-09-17 ENCOUNTER — Encounter (INDEPENDENT_AMBULATORY_CARE_PROVIDER_SITE_OTHER): Payer: Medicare Other | Admitting: Internal Medicine

## 2020-09-17 DIAGNOSIS — G4733 Obstructive sleep apnea (adult) (pediatric): Secondary | ICD-10-CM | POA: Diagnosis not present

## 2020-09-19 ENCOUNTER — Ambulatory Visit: Payer: Medicare Other | Admitting: Physical Therapy

## 2020-09-20 NOTE — Procedures (Signed)
[facility]  Polysomnogram Report Part I  Phone: [phone] Fax: [phone]  Patient Name: James [name], James [name] Acquisition Number: [id number]  Date of Birth: May 25, 1946 Acquisition Date: 09/17/2020  Referring Physician: Neomia Dear. Harrington Challenger, MD     History: The patient is a 74 year old male with obstructive sleep apnea for CPAP re-titration. Medical History: abdominal cyst surgery, cardiac bypass, heart attack, stroke, BPH.  Medications: atorvastatin, finasteride, metoprolol, aspirin, vitamin D3, tamsulosin, donepezil, telmisartan, allopurinol, metformin, Zoloft, memantine.  Procedure: This routine overnight polysomnogram was performed on the [facility] 5 using the standard CPAP protocol. This included 6 channels of EEG, 2 channels of EOG, chin EMG, bilateral anterior tibialis EMG, nasal/oral thermistor, PTAF (nasal pressure transducer), chest and abdominal wall movements, EKG, and pulse oximetry.  Description: The total recording time was 494.7 minutes. The total sleep time was 269.5 minutes. There were a total of 199.4 minutes of wakefulness after sleep onset for a poor?sleep efficiency of 54.5%. The latency to sleep onset was within normal limits?at 25.8 minutes. The R sleep onset latency was within normal limits at 97.5 minutes.?? Sleep parameters, as a percentage of the total sleep time, demonstrated 21.7% of sleep was in N1 sleep, 23.6% N2, 0.0% N3 and 54.7% R sleep. There were a total of 65 arousals for an arousal index of 14.5 arousals per hour of sleep that was slightly elevated.?  Overall, there were a total of 80 respiratory events for a respiratory disturbance index, which includes apneas, hypopneas and RERAs (increased respiratory effort) of 17.8 respiratory events per hour of sleep during the pressure titration. CPAP was initiated at 4 cm H2O at lights out, 10:02 p.m. It was titrated in 1-2 cm increments for obstructive events to 17 cm H2O. The apnea was well controlled  at this pressure and supine, REM sleep. The pressure was further titrated to the final pressure of  19 cm H2O.   Additionally, the baseline oxygen saturation during wakefulness was 96%, during NREM sleep averaged 96%, and during REM sleep averaged 96%. The total duration of oxygen < 90% was 0.0 minutes.  Cardiac monitoring- intermittent PVCs with rare bigeminy and couplets were observed   Periodic limb movement monitoring- demonstrated that there were 261 periodic limb movements for a periodic limb movement index of 58.1 periodic limb movements per hour of sleep. Quasi-periodic limb movements were observed during some periods of wakefulness.    Impression: This patient's obstructive sleep apnea demonstrated significant improvement with the utilization of nasal CPAP at 17 cm H2O.   There was a significantly elevated periodic limb movement index of 58.1 periodic limb movements per hour of sleep. In addition, quasi-periodic limb movements were observed during some periods of wakefulness. Many limb movements could not be scored due to the frequent apneas. These limb movements may contribute to the elevated AHI on the patient's CPAP download.  Limited EKG monitoring demonstrated intermittent PVCs with rare bigeminy and couplets/  Recommendations: 1. CPAP titration was adequate to control this patients respiratory events. Would recommend increasing CPAP to 17 cm H2O. I f patient is not able to tolerate CPAP at this pressure then consider BIPAP evaluation      2. No major desaturation below 90% is noted in this study 3. Weight loss suggested for BMI of 38.4 4. An Airfit F20 mask, size medium, was used. Chin strap used during study- no. Humidifier used during study- yes.  5. Evaluation for possible restless leg syndrome is  suggested.    Yevonne Pax, MD, [facility] Diplomate ABMS Pulmonary, Critical Care  Sleep Medicine  Electronically reviewed and digitally signed    [facility] CPAP/BIPAP Polysomnogram Report Part II Phone: [phone] Fax: [phone]  Patient last name [place] Size 18.5 in. Acquisition [id number]  Patient first name James [name]. Weight 275.0 lbs. Started 09/17/2020 at 9:46:34 PM  Birth date 1945/12/08 Height 71.0 in. Stopped 09/18/2020 at 6:18:28 AM  Age 74      Type Adult BMI 38.4 lb/in2 Duration 494.7  Report generated by: Richrd Sox. Heywood Footman, RPSGT Sleep Data: Lights Out: 10:02:16 PM Sleep Onset: 10:28:04 PM  Lights On: 6:16:58 AM Sleep Efficiency: 54.5 %  Total Recording Time: 494.7 min Sleep Latency (from Lights Off) 25.8 min  Total Sleep Time (TST): 269.5 min R Latency (from Sleep Onset): 97.5 min  Sleep Period Time: 468.5 min Total number of awakenings: 64  Wake during sleep: 199.0 min Wake After Sleep Onset (WASO): 199.4 min   Sleep Data:         Arousal Summary: Stage  Latency from lights out (min) Latency from sleep onset (min) Duration (min) % Total Sleep Time  Normal values  N 1 25.8 0.0 58.5 21.7 (5%)  N 2 157.8 132.0 63.5 23.6 (50%)  N 3       0.0 0.0 (20%)  R 123.3 97.5 147.5 54.7 (25%)    Number Index  Spontaneous 52 11.6  Apneas & Hypopneas 31 6.9  RERAs 1 0.2       (Apneas & Hypopneas & RERAs)  (32) (7.1)  Limb Movement 7 1.6  Snore 0 0.0  TOTAL 91 20.3      Respiratory Data:  CA OA MA Apnea Hypopnea* A+ H RERA Total  Number 0 1 1 2  77 79 1 80  Mean Dur (sec) 0.0 11.5 11.5 11.5 31.9 31.3 26.0 31.3  Max Dur (sec) 0.0 11.5 11.5 11.5 63.5 63.5 26.0 63.5  Total Dur (min) 0.0 0.2 0.2 0.4 40.9 41.3 0.4 41.7  % of TST 0.0 0.1 0.1 0.1 15.2 15.3 0.2 15.5  Index (#/h TST) 0.0 0.2 0.2 0.4 17.1 17.6 0.2 17.8  *Hypopneas scored based on 4% or greater desaturation.  Sleep Stage:         REM NREM TST  AHI 11.0 25.6 17.6  RDI 11.0 26.1 17.8    Sleep (min) TST (%) REM (min) NREM (min) CA (#) OA (#) MA (#) HYP (#) AHI (#/h) RERA (#) RDI (#/h) Desat (#)  Supine 266.5 98.89 147.3 119.2 0 1 1 77  17.8 1 18.0 121  Non-Supine 3.00 1.11 0.20 2.80 0.00 0.00 0.00 0.00 0.00 0 0.00 0.00  Left: 3.0 1.11 0.2 2.8 0 0 0 0 0.0 0 0.00 0     Snoring: Total number of snoring episodes  0  Total time with snoring    min (   % of sleep)   Oximetry Distribution:             WK REM NREM TOTAL  Average (%)   96 96 96 96  < 90% 0.0 0.0 0.0 0.0  < 80% 0.0 0.0 0.0 0.0  < 70% 0.0 0.0 0.0 0.0  # of Desaturations* 32 41 41 114  Desat Index (#/hour) 8.5 16.7 20.2 25.4  Desat Max (%) 6 6 8 8   Desat Max Dur (sec) 77.0 114.5 67.0 114.5  Approx Min O2 during sleep 91  Approx min O2 during  a respiratory event 90  Was Oxygen added (Y/N) and final rate No:   0 LPM  *Desaturations based on 3% or greater drop from baseline.   Cheyne Stokes Breathing: None Present    Heart Rate Summary:  Average Heart Rate During Sleep 72.3 bpm      Highest Heart Rate During Sleep (95th %) 80.0 bpm      Highest Heart Rate During Sleep 190 bpm (artifact)  Highest Heart Rate During Recording (TIB) 228 bpm (artifact)   Heart Rate Observations: Event Type # Events   Bradycardia 0 Lowest HR Scored: N/A  Sinus Tachycardia During Sleep 0 Highest HR Scored: N/A  Narrow Complex Tachycardia 0 Highest HR Scored: N/A  Wide Complex Tachycardia 0 Highest HR Scored: N/A  Asystole 0 Longest Pause: N/A  Atrial Fibrillation 0 Duration Longest Event: N/A  Other Arrythmias  No Type:   Periodic Limb Movement Data: (Primary legs unless otherwise noted) Total # Limb Movement 264 Limb Movement Index 58.8  Total # PLMS 264 PLMS Index 58.8  Total # PLMS Arousals 7 PLMS Arousal Index 1.6  Percentage Sleep Time with PLMS 88.34min (32.9 % sleep)  Mean Duration limb movements (secs) 760.9    IPAP Level (cmH2O) EPAP Level (cmH2O) Total Duration (min) Sleep Duration (min) Sleep (%) REM (%) CA  #) OA # MA # HYP #) AHI (#/hr) RERAs # RERAs (#/hr) RDI (#/hr)  4 4 75.9 8.5 11.2 0.0 0 1 1 9  77.6 0 0.0 77.6  6 6  20.1 4.3 21.4 0.0 0 0 0 3 41.9 1  14.0 55.8  8 8  11.4 5.4 47.4 21.9 0 0 0 7 77.8 0 0.0 77.8  9 9  28.8 15.8 54.9 10.4 0 0 0 20 75.9 0 0.0 75.9  10 10  14.7 6.2 42.2 15.0 0 0 0 7 67.7 0 0.0 67.7  11 11  19.2 12.2 63.5 52.1 0 0 0 8 39.3 0 0.0 39.3  12 12  41.5 15.5 37.3 16.9 0 0 0 7 27.1 0 0.0 27.1  13 13  29.1 15.6 53.6 35.7 0 0 0 11 42.3 0 0.0 42.3  14 14  32.5 21.0 64.6 15.4 0 0 0 1 2.9 0 0.0 2.9  15 15  23.0 23.0 100.0 41.3 0 0 0 2 5.2 0 0.0 5.2  16 16  7.9 7.4 93.7 93.7 0 0 0 2 16.2 0 0.0 16.2  17 17  56.2 37.7 67.1 30.6 0 0 0 0 0.0 0 0.0 0.0  18 18 64.0 53.5 83.6 67.2 0 0 0 0 0.0 0 0.0 0.0  19 19 40.2 39.7 98.8 70.1 0 0 0 0 0.0 0 0.0 0.0

## 2020-09-24 ENCOUNTER — Ambulatory Visit: Payer: Medicare Other | Admitting: Physical Therapy

## 2020-10-08 ENCOUNTER — Ambulatory Visit (INDEPENDENT_AMBULATORY_CARE_PROVIDER_SITE_OTHER): Payer: Medicare Other | Admitting: Internal Medicine

## 2020-10-08 VITALS — BP 110/64 | Ht 71.0 in | Wt 265.0 lb

## 2020-10-08 DIAGNOSIS — G4761 Periodic limb movement disorder: Secondary | ICD-10-CM

## 2020-10-08 DIAGNOSIS — G4733 Obstructive sleep apnea (adult) (pediatric): Secondary | ICD-10-CM

## 2020-10-08 DIAGNOSIS — Z7189 Other specified counseling: Secondary | ICD-10-CM | POA: Diagnosis not present

## 2020-10-08 NOTE — Patient Instructions (Signed)

## 2020-10-08 NOTE — Progress Notes (Signed)
[facility] [address] [place], [place] [address]  Pulmonary Sleep Medicine   Office Visit Note  Patient Name: James [name] DOB: 1945/12/03 MRN [id number]    Chief Complaint: Obstructive Sleep Apnea visit  Brief History:  James [name] is seen today for follow up after a recent CPAP titration study.  The patient has a 10 year history of sleep apnea. Patient is using PAP nightly.  He reported sleeping ok with the higher pressured in the laboratory.  He does report some RLS symptoms but he finds that he sleeps ok. The compliance download shows excellent compliance with an average use time of 12.7 hours including 3 hour naps. The AHI is 6.6  The patient - of limb movements disrupting sleep. ROS  General: (-) fever, (-) chills, (-) night sweat Nose and Sinuses: (-) nasal stuffiness or itchiness, (-) postnasal drip, (-) nosebleeds, (-) sinus trouble. Mouth and Throat: (-) sore throat, (-) hoarseness. Neck: (-) swollen glands, (-) enlarged thyroid, (-) neck pain. Respiratory: - cough, - shortness of breath, - wheezing. Neurologic: - numbness, - tingling. Psychiatric: - anxiety, - depression   Current Medication: Outpatient Encounter Medications as of 10/08/2020  Medication Sig  . allopurinol (ZYLOPRIM) 100 MG tablet Take 1 tablet by mouth daily.  Marland Kitchen aspirin 81 MG tablet Take 81 mg by mouth daily.  Marland Kitchen atorvastatin (LIPITOR) 80 MG tablet Take 1 tablet by mouth.  . colchicine 0.6 MG tablet Take 2 tablets (1.2mg ) by mouth at first sign of gout flare followed by 1 tablet (0.6mg ) after 1 hour. (Max 1.8mg  within 1 hour)  . cyanocobalamin (,VITAMIN B-12,) 1000 MCG/ML injection Inject into the muscle.  . diphenhydrAMINE (BENADRYL) 25 mg capsule Take by mouth.  . donepezil (ARICEPT) 10 MG tablet TAKE 1 TABLET BY MOUTH EVERY DAY AT NIGHT  . finasteride (PROSCAR) 5 MG tablet Take 1 tablet (5 mg total) by mouth daily.  . memantine (NAMENDA) 10 MG tablet Take 10 mg by mouth 2 (two) times  daily.  . metFORMIN (GLUCOPHAGE) 500 MG tablet Take by mouth.  . metoprolol succinate (TOPROL-XL) 50 MG 24 hr tablet Take by mouth.  . nitroGLYCERIN (NITROSTAT) 0.4 MG SL tablet Place under the tongue as needed.  . ramipril (ALTACE) 10 MG capsule Take 10 mg by mouth daily.  . sertraline (ZOLOFT) 100 MG tablet Take 1 tablet by mouth daily. For 90days  . sildenafil (REVATIO) 20 MG tablet Take 1 tablet (20 mg total) by mouth 3 (three) times daily.  . tamsulosin (FLOMAX) 0.4 MG CAPS capsule Take 1 capsule (0.4 mg total) by mouth daily after supper.  . Vitamin D, Cholecalciferol, 1000 UNITS CAPS Take by mouth daily.   No facility-administered encounter medications on file as of 10/08/2020.    Surgical History: Past Surgical History:  Procedure Laterality Date  . abdominal cyst surgery    . cardiac bypass     3 stents puts in    Medical History: Past Medical History:  Diagnosis Date  . BPH (benign prostatic hyperplasia)   . Erectile dysfunction   . Erectile dysfunction   . Heart attack (HCC)   . Hypogonadism in male   . Stroke [facility])     Family History: Non contributory to the present illness  Social History: Social History   Socioeconomic History  . Marital status: Married    Spouse name: Not on file  . Number of children: Not on file  . Years of education: Not on file  . Highest education level: Not on  file  Occupational History  . Not on file  Tobacco Use  . Smoking status: Former Smoker    Packs/day: 1.50    Years: 25.00    Pack years: 60.50    Quit date: 11/17/1984    Years since quitting: 35.9  . Smokeless tobacco: Never Used  . Tobacco comment: "years ago"  Substance and Sexual Activity  . Alcohol use: No    Alcohol/week: 0.0 standard drinks  . Drug use: No  . Sexual activity: Not on file  Other Topics Concern  . Not on file  Social History Narrative  . Not on file   Social Determinants of Health   Financial Resource Strain:   . Difficulty of Paying  Living Expenses: Not on file  Food Insecurity:   . Worried About Programme researcher, broadcasting/film/video in the Last Year: Not on file  . Ran Out of Food in the Last Year: Not on file  Transportation Needs:   . Lack of Transportation (Medical): Not on file  . Lack of Transportation (Non-Medical): Not on file  Physical Activity:   . Days of Exercise per Week: Not on file  . Minutes of Exercise per Session: Not on file  Stress:   . Feeling of Stress : Not on file  Social Connections:   . Frequency of Communication with Friends and Family: Not on file  . Frequency of Social Gatherings with Friends and Family: Not on file  . Attends Religious Services: Not on file  . Active Member of Clubs or Organizations: Not on file  . Attends Banker Meetings: Not on file  . Marital Status: Not on file  Intimate Partner Violence:   . Fear of Current or Ex-Partner: Not on file  . Emotionally Abused: Not on file  . Physically Abused: Not on file  . Sexually Abused: Not on file    Vital Signs: Blood pressure 110/64, height 5\' 11"  (1.803 m), weight 265 lb (120.2 kg), SpO2 96 %.  Examination: General Appearance: The patient is well-developed, well-nourished, and in no distress. Neck Circumference: 44 Skin: Gross inspection of skin unremarkable. Head: normocephalic, no gross deformities. Eyes: no gross deformities noted. ENT: ears appear grossly normal Neurologic: Alert and oriented. No involuntary movements.       SLEEP STUDIES:  1. 07/27/10 Split  AHI 29 SpO40min 88%, CPAP 8 cm H2O   CPAP COMPLIANCE DATA:  Date Range: 09/04/20 - 10/03/20  Average Daily Use: 12:43 hours  Median Use: 12:42 hours  Compliance for > 4 Hours: 100%  AHI: 6.6  respiratory events per hour  Days Used: 30/30  Mask Leak: 38.6  95th Percentile Pressure: 8 cmH2O         LABS: Recent Results (from the past 2160 hour(s))  PSA     Status: None   Collection Time: 08/17/20  8:06 AM  Result Value Ref Range    Prostatic Specific Antigen 0.16 0.00 - 4.00 ng/mL    Comment: (NOTE) While PSA levels of <=4.0 ng/ml are reported as reference range, some men with levels below 4.0 ng/ml can have prostate cancer and many men with PSA above 4.0 ng/ml do not have prostate cancer.  Other tests such as free PSA, age specific reference ranges, PSA velocity and PSA doubling time may be helpful especially in men less than 64 years old. Performed at [facility], [address], [place], [place]     Radiology: No results found.  No results found.  No  results found.    Assessment and Plan: Patient Active Problem List   Diagnosis Date Noted  . Gait disorder 02/24/2020  . 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 Stasis dermatitis of both legs 09/20/2019  . Ischemic cardiomyopathy 08/19/2019  . 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 BMI 40.0-44.9, adult (HCC) 08/14/2017  . Severe obesity (BMI 35.0-39.9) with comorbidity (HCC) 08/14/2017  . 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 Gout 10/24/2015  . Acute myocardial infarction of anterior wall (HCC) 10/10/2015  . Acute blood loss anemia 09/22/2015  . Ecchymosis 09/22/2015  . Acute systolic heart failure (HCC) 09/21/2015  . CAD in native artery 09/21/2015  . Frank hematuria 09/21/2015  . ST elevation myocardial infarction (STEMI) (HCC) 09/21/2015  . Avitaminosis D 08/15/2015  . 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  . 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  . Other long term (current) drug therapy 02/20/2015  . Mild cognitive disorder 01/25/2015  . Mild cognitive impairment 01/25/2015  . Chest pain 09/20/2014  . Breathlessness on exertion 09/20/2014  . H/O transient cerebral ischemia 09/20/2014  . B12 deficiency 08/13/2014  . 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 ED (erectile dysfunction) of organic origin 08/13/2014  . Benign essential HTN  08/13/2014  . Hypercholesterolemia 08/13/2014  . 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 Obstructive apnea 08/13/2014  . Pancytopenia (HCC) 08/13/2014  . Type 2 diabetes mellitus (HCC) 08/13/2014  . Hypertension associated with type 2 diabetes mellitus (HCC) 08/13/2014      The  Study results were discussed with thepatient. His pressure will be increased to 17 cm H2O today and we will get a download in 2 weeks. If he does not tolerate this pressure we can return the  Pressure back to 8 cm H2O. The compliance is excellent. The AHI is 6.6. This may be inadequate pressure or the PLMS. They will let us know if he does not tolerate this pressure. They are to check with his PCP to have the iron and ferritin c hecked..   1. OSA on cpap- pressure will be increased to 17 cm H2O. 2. CPAP couseling-Discussed importance of adequate CPAP use as well as proper care and cleaning techniques of machine and all supplies. 3. PLMD - Pt requested our office to order Fe and Iron studies at [facility] at [facility].  Pt aware to follow up with PCP, Dr. Harrington Challenger. 4. Obesity - Discussed the importance of weight management through healthy eating and daily exercise as tolerated. Discussed the negative effects obesity has on pulmonary health, cardiac health as well as overall general health and well being.   General Counseling: I have discussed the findings of the evaluation and examination with James [name].  I have also discussed any further diagnostic evaluation thatmay be needed or ordered today. James [name] verbalizes understanding of the findings of todays visit. We also reviewed his medications today and discussed drug interactions and side effects including but not limited excessive drowsiness and altered mental states. We also discussed that there is always a risk not just to him but also people around him. he has been encouraged to call the office with any questions or concerns that should arise related to todays visit.  No  orders of the defined types were placed in this encounter.     This patient was seen by Layla Barter, AGNP-C in collaboration with Dr. Freda Munro as a part of collaborative care agreement.    I have personally obtained a  history, examined the patient, evaluated laboratory and imaging results, formulated the assessment and plan and placed orders.   Valentino Hue Sol Blazing, PhD, FAASM  Diplomate, American Board of Sleep Medicine    Yevonne Pax, MD [facility] Diplomate ABMS Pulmonary and Critical Care Medicine Sleep medicine

## 2020-10-17 LAB — IRON,TIBC AND FERRITIN PANEL
Ferritin: 87 ng/mL (ref 30–400)
Iron Saturation: 16 % (ref 15–55)
Iron: 51 ug/dL (ref 38–169)
Total Iron Binding Capacity: 310 ug/dL (ref 250–450)
UIBC: 259 ug/dL (ref 111–343)

## 2021-02-12 ENCOUNTER — Ambulatory Visit
Admission: RE | Admit: 2021-02-12 | Discharge: 2021-02-12 | Disposition: A | Discharge status: Home / Self Care | Payer: Medicare Other | Attending: Otolaryngology | Admitting: Otolaryngology

## 2021-02-12 ENCOUNTER — Other Ambulatory Visit: Payer: Self-pay

## 2021-02-12 ENCOUNTER — Other Ambulatory Visit: Payer: Self-pay | Admitting: Otolaryngology

## 2021-02-12 ENCOUNTER — Ambulatory Visit
Admission: RE | Admit: 2021-02-12 | Discharge: 2021-02-12 | Disposition: A | Discharge status: Home / Self Care | Payer: Medicare Other | Source: Ambulatory Visit | Attending: Otolaryngology | Admitting: Otolaryngology

## 2021-02-12 DIAGNOSIS — R053 Chronic cough: Secondary | ICD-10-CM

## 2021-02-20 ENCOUNTER — Other Ambulatory Visit
Admission: RE | Admit: 2021-02-20 | Discharge: 2021-02-20 | Disposition: A | Discharge status: Home / Self Care | Payer: Medicare Other | Source: Ambulatory Visit | Attending: Physician Assistant | Admitting: Physician Assistant

## 2021-02-20 DIAGNOSIS — I5021 Acute systolic (congestive) heart failure: Secondary | ICD-10-CM | POA: Diagnosis present

## 2021-02-20 DIAGNOSIS — R0602 Shortness of breath: Secondary | ICD-10-CM | POA: Diagnosis present

## 2021-02-20 DIAGNOSIS — Z79899 Other long term (current) drug therapy: Secondary | ICD-10-CM | POA: Insufficient documentation

## 2021-02-20 LAB — BRAIN NATRIURETIC PEPTIDE: B Natriuretic Peptide: 155.7 pg/mL — ABNORMAL HIGH (ref 0.0–100.0)

## 2021-08-05 ENCOUNTER — Other Ambulatory Visit: Payer: Self-pay | Admitting: Urology

## 2021-08-05 DIAGNOSIS — N138 Other obstructive and reflux uropathy: Secondary | ICD-10-CM

## 2021-08-12 ENCOUNTER — Ambulatory Visit: Payer: Medicare Other | Admitting: Urology

## 2021-08-18 NOTE — Progress Notes (Signed)
08/19/2021 8:43 AM   James [name] May 31, 1946 [id number]  Referring provider: Ezequiel Kayser, MD [facility] [place],  [place] [address]  Chief Complaint  Patient presents with   Follow-up    1 year follow-up    Urological history: 1. BPH with LU TS -PSA 0.61 in 08/2020 -I PSS 2/2 -managed with tamsulosin 0.4 mg daily and finasteride 5 mg daily  2. ED -contributing factors of age, BPH, stroke, CAD, MI, sleep apnea, diabetes and smoking history -no longer sexually active  HPI: James [name] is a 75 y.o. male who presents today for one year follow up with his wife, James [name].   He has complaints of frequency, but he states it is not bothersome.  Patient denies any modifying or aggravating factors.  Patient denies any gross hematuria, dysuria or suprapubic/flank pain.  Patient denies any fevers, chills, nausea or vomiting.     IPSS     Row Name 08/19/21 0800         International Prostate Symptom Score   How often have you had the sensation of not emptying your bladder? Not at All     How often have you had to urinate less than every two hours? Less than 1 in 5 times     How often have you found you stopped and started again several times when you urinated? Not at All     How often have you found it difficult to postpone urination? Not at All     How often have you had a weak urinary stream? Not at All     How often have you had to strain to start urination? Not at All     How many times did you typically get up at night to urinate? 1 Time     Total IPSS Score 2           Quality of Life due to urinary symptoms   If you were to spend the rest of your life with your urinary condition just the way it is now how would you feel about that? Mostly Satisfied               Score:  1-7 Mild 8-19 Moderate 20-35 Severe  PMH: Past Medical History:  Diagnosis Date   BPH (benign prostatic hyperplasia)    Erectile dysfunction    Erectile  dysfunction    Heart attack ([place])    Hypogonadism in male    Stroke [facility])     Surgical History: Past Surgical History:  Procedure Laterality Date   abdominal cyst surgery     cardiac bypass     3 stents puts in    Home Medications:  Allergies as of 08/19/2021   No Known Allergies      Medication List        Accurate as of August 19, 2021  8:43 AM. If you have any questions, ask your nurse or doctor.          STOP taking these medications    sildenafil 20 MG tablet Commonly known as: REVATIO Stopped by: Dene Nazir, PA-C       TAKE these medications    allopurinol 100 MG tablet Commonly known as: ZYLOPRIM Take 1 tablet by mouth daily.   aspirin 81 MG tablet Take 81 mg by mouth daily.   atorvastatin 80 MG tablet Commonly known as: LIPITOR Take 1 tablet by mouth.   colchicine 0.6 MG tablet Take 2 tablets (1.82m) by  mouth at first sign of gout flare followed by 1 tablet (0.16m) after 1 hour. (Max 1.871mwithin 1 hour)   cyanocobalamin 1000 MCG/ML injection Commonly known as: (VITAMIN B-12) Inject into the muscle.   diphenhydrAMINE 25 mg capsule Commonly known as: BENADRYL Take by mouth.   donepezil 10 MG tablet Commonly known as: ARICEPT TAKE 1 TABLET BY MOUTH EVERY DAY AT NIGHT   finasteride 5 MG tablet Commonly known as: PROSCAR Take 1 tablet (5 mg total) by mouth daily.   memantine 10 MG tablet Commonly known as: NAMENDA Take 10 mg by mouth 2 (two) times daily.   metFORMIN 500 MG tablet Commonly known as: GLUCOPHAGE Take by mouth.   metoprolol succinate 50 MG 24 hr tablet Commonly known as: TOPROL-XL Take by mouth.   nitroGLYCERIN 0.4 MG SL tablet Commonly known as: NITROSTAT Place under the tongue as needed.   ramipril 10 MG capsule Commonly known as: ALTACE Take 10 mg by mouth daily.   sertraline 50 MG tablet Commonly known as: ZOLOFT Take 1 tablet by mouth daily. What changed: Another medication with the same name was  removed. Continue taking this medication, and follow the directions you see here. Changed by: SHZara CouncilPA-C   tamsulosin 0.4 MG Caps capsule Commonly known as: FLOMAX TAKE 1 CAPSULE (0.4 MG TOTAL) BY MOUTH DAILY AFTER SUPPER.   Vitamin D (Cholecalciferol) 25 MCG (1000 UT) Caps Take by mouth daily.        Allergies: No Known Allergies  Family History: Family History  Problem Relation Age of Onset   Kidney disease Neg Hx    Prostate cancer Neg Hx    Bladder Cancer Neg Hx    Kidney cancer Neg Hx     Social History:  reports that he quit smoking about 36 years ago. His smoking use included cigarettes. He has a 37.50 pack-year smoking history. He has never used smokeless tobacco. He reports that he does not drink alcohol and does not use drugs.  ROS: For pertinent review of systems please refer to history of present illness  Physical Exam: BP 123/76   Pulse 78   Ht _0  (1.803 m)   Wt 242 lb (109.8 kg)   BMI 33.75 kg/m   Constitutional:  Well nourished. Alert and oriented, No acute distress. HEENT: [place] AT, mask in place.  Trachea midline Cardiovascular: No clubbing, cyanosis, or edema. Respiratory: Normal respiratory effort, no increased work of breathing. GU: No CVA tenderness.  No bladder fullness or masses.  Patient with uncircumcised phallus. Foreskin easily retracted  Urethral meatus is patent.  No penile discharge. No penile lesions or rashes. Scrotum without lesions, cysts, rashes and/or edema.  Testicles are located scrotally bilaterally. No masses are appreciated in the testicles. Left and right epididymis are normal. Rectal: Patient with  normal sphincter tone. Anus and perineum with dry flaking skin.  No rectal masses are appreciated. Prostate is approximately 35 grams, no nodules are appreciated. Seminal vesicles are normal. Neurologic: Grossly intact, no focal deficits, moving all 4 extremities. Psychiatric: Normal mood and affect.   Laboratory  Data: Hemoglobin A1C 4.2 - 5.6 % 6.5 High    Average Blood Glucose (Calc) mg/dL 140   Resulting Ag[facility]arrative Performed by [facility] Normal Range:    4.2 - 5.6%  Increased Risk:  5.7 - 6.4%  Diabetes:        >= 6.5%  Glycemic Control for adults with diabetes:  <  7%   Specimen Collected: 07/30/21 09:32 Last Resulted: 07/30/21 13:54  Received From: [facility]  Result Received: 08/18/21 16:59   Glucose 70 - 110 mg/dL 122 High    Sodium 136 - 145 mmol/L 142   Potassium 3.6 - 5.1 mmol/L 4.4   Chloride 97 - 109 mmol/L 111 High    Carbon Dioxide (CO2) 22.0 - 32.0 mmol/L 25.3   Calcium 8.7 - 10.3 mg/dL 8.9   Urea Nitrogen (BUN) 7 - 25 mg/dL 20   Creatinine 0.7 - 1.3 mg/dL 1.2   Glomerular Filtration Rate (eGFR), MDRD Estimate >60 mL/min/1.73sq m 59 Low    BUN/Crea Ratio 6.0 - 20.0 16.7   Anion Gap w/K 6.0 - 16.0 10.1   Resulting Agency  [facility]  Specimen Collected: 04/19/21 09:06 Last Resulted: 04/19/21 16:06  Received From: [place]  Result Received: 06/26/21 11:39   Protein, Total 6.1 - 7.9 g/dL 5.8 Low    Albumin 3.5 - 4.8 g/dL 3.8   Bilirubin, Total 0.3 - 1.2 mg/dL 0.8   Bilirubin, Conjugated 0.00 - 0.20 mg/dL 0.19   Alk Phos (alkaline Phosphatase) 34 - 104 U/L 95   AST  8 - 39 U/L 13   ALT  6 - 57 U/L 16   Resulting Agency  [facility]  Specimen Collected: 02/27/21 08:57 Last Resulted: 02/27/21 13:33  Received From: [facility]  Result Received: 03/08/21 15:47   Uric Acid 4.4 - 7.6 mg/dL 7.8 High    Resulting Agency  [place] - [facility]  Specimen Collected: 02/27/21 08:57 Last Resulted: 02/27/21 13:34  Received From: [facility]  Result Received: 03/08/21 15:47   Color Yellow, Violet, Light Violet, Dark Violet Yellow   Clarity Clear Clear   Specific Gravity 1.000 - 1.030 1.020   pH, Urine 5.0 - 8.0 5.5    Protein, Urinalysis Negative, Trace mg/dL Negative   Glucose, Urinalysis Negative mg/dL Negative   Ketones, Urinalysis Negative mg/dL Trace Abnormal    Blood, Urinalysis Negative Negative   Nitrite, Urinalysis Negative Negative   Leukocyte Esterase, Urinalysis Negative Negative   White Blood Cells, Urinalysis None Seen, 0-3 /hpf None Seen   Red Blood Cells, Urinalysis None Seen, 0-3 /hpf None Seen   Bacteria, Urinalysis None Seen /hpf None Seen   Squamous Epithelial Cells, Urinalysis Rare, Few, None Seen /hpf None Seen   Resulting Agency  [facility] - [facility]  Specimen Collected: 02/27/21 08:57 Last Resulted: 02/27/21 14:19  Received From: [facility]  Result Received: 03/08/21 15:47  I have reviewed the labs.   Assessment & Plan:    1. BPH with LUTS -symptoms - minimal bother -continue conservative management, avoiding bladder irritants and timed voiding's -Continue tamsulosin 0.4 mg daily and finasteride 5 mg daily- refills given  2. Dry skin -dry skin noted in the gluteal crease -advised to apply skin barrier  Return in about 1 year (around 08/19/2022) for I PSS and medication refill .  Zara Council, PA-C  [facility] [address] [place], [place] [address] [phone]

## 2021-08-19 ENCOUNTER — Other Ambulatory Visit: Payer: Self-pay

## 2021-08-19 ENCOUNTER — Encounter: Payer: Self-pay | Admitting: Urology

## 2021-08-19 ENCOUNTER — Ambulatory Visit: Payer: Medicare Other | Admitting: Urology

## 2021-08-19 VITALS — BP 123/76 | HR 78 | Ht 71.0 in | Wt 242.0 lb

## 2021-08-19 DIAGNOSIS — N138 Other obstructive and reflux uropathy: Secondary | ICD-10-CM | POA: Diagnosis not present

## 2021-08-19 DIAGNOSIS — N401 Enlarged prostate with lower urinary tract symptoms: Secondary | ICD-10-CM | POA: Diagnosis not present

## 2021-08-19 MED ORDER — FINASTERIDE 5 MG PO TABS: 5.0000 mg | ORAL_TABLET | Freq: Every day | ORAL | 3 refills | Status: DC

## 2022-07-27 ENCOUNTER — Other Ambulatory Visit: Payer: Self-pay | Admitting: Urology

## 2022-07-27 DIAGNOSIS — N138 Other obstructive and reflux uropathy: Secondary | ICD-10-CM

## 2022-08-22 NOTE — Progress Notes (Signed)
08/25/2022 8:51 AM   James [name] 05-15-1946 [id number]  Referring provider: No referring provider defined for this encounter.  Urological history: 1. BPH with LU TS -PSA (08/2020) 0.16 -I PSS 1/1 -tamsulosin 0.4 mg daily and finasteride 5 mg daily  2. ED -contributing factors of age, BPH, stroke, CAD, MI, sleep apnea, diabetes and smoking history -no longer sexually active  Chief Complaint  Patient presents with   Benign Prostatic Hypertrophy    HPI: James [name] is a 76 y.o. male who presents today for one year follow up with his wife, James [name].   James [name] is suffering from dementia, so his history is given by his wife.   She has been noticing smears of blood on the toilet seat.  She feels it is likely coming from skin breakdown as she has not seen evidence of gross hematuria or blood in the stool.  Patient denies any modifying or aggravating factors.  Patient denies any gross hematuria, dysuria or suprapubic/flank pain.  Patient denies any fevers, chills, nausea or vomiting.    Their PCP is also asking if they could discontinue the tamsulosin and the finasteride at this time.  She could not elaborate as to why they are wanting him to stop these medications other than reducing medications.    I PSS 1/1   IPSS     Row Name 08/25/22 0800         International Prostate Symptom Score   How often have you had the sensation of not emptying your bladder? Not at All     How often have you had to urinate less than every two hours? Not at All     How often have you found you stopped and started again several times when you urinated? Not at All     How often have you found it difficult to postpone urination? Not at All     How often have you had a weak urinary stream? Not at All     How often have you had to strain to start urination? Not at All     How many times did you typically get up at night to urinate? 1 Time     Total IPSS Score 1       Quality of Life due to urinary  symptoms   If you were to spend the rest of your life with your urinary condition just the way it is now how would you feel about that? Pleased                Score:  1-7 Mild 8-19 Moderate 20-35 Severe  PMH: Past Medical History:  Diagnosis Date   BPH (benign prostatic hyperplasia)    Erectile dysfunction    Erectile dysfunction    Heart attack ([employer])    Hypogonadism in male    Stroke [facility])     Surgical History: Past Surgical History:  Procedure Laterality Date   abdominal cyst surgery     cardiac bypass     3 stents puts in    Home Medications:  Allergies as of 08/25/2022       Reactions   Ramipril    Other reaction(s): Other (See Comments) Hiccups    Empagliflozin    Other reaction(s): Other (See Comments) dysuria        Medication List        Accurate as of August 25, 2022  8:51 AM. If you have any questions, ask your nurse or doctor.  allopurinol 100 MG tablet Commonly known as: ZYLOPRIM Take 1 tablet by mouth daily.   aspirin 81 MG tablet Take 81 mg by mouth daily.   atorvastatin 80 MG tablet Commonly known as: LIPITOR Take 1 tablet by mouth.   colchicine 0.6 MG tablet Take 2 tablets (1.96m) by mouth at first sign of gout flare followed by 1 tablet (0.62m after 1 hour. (Max 1.36m636mithin 1 hour)   cyanocobalamin 1000 MCG/ML injection Commonly known as: VITAMIN B12 Inject into the muscle.   diphenhydrAMINE 25 mg capsule Commonly known as: BENADRYL Take by mouth.   donepezil 10 MG tablet Commonly known as: ARICEPT TAKE 1 TABLET BY MOUTH EVERY DAY AT NIGHT   Eliquis 5 MG Tabs tablet Generic drug: apixaban Take 5 mg by mouth 2 (two) times daily.   finasteride 5 MG tablet Commonly known as: PROSCAR Take 1 tablet (5 mg total) by mouth daily.   memantine 10 MG tablet Commonly known as: NAMENDA Take 10 mg by mouth 2 (two) times daily.   metFORMIN 500 MG tablet Commonly known as: GLUCOPHAGE Take by mouth.    metoprolol succinate 50 MG 24 hr tablet Commonly known as: TOPROL-XL Take by mouth.   nitroGLYCERIN 0.4 MG SL tablet Commonly known as: NITROSTAT Place under the tongue as needed.   ramipril 10 MG capsule Commonly known as: ALTACE Take 10 mg by mouth daily.   sertraline 50 MG tablet Commonly known as: ZOLOFT Take 1 tablet by mouth daily.   tamsulosin 0.4 MG Caps capsule Commonly known as: FLOMAX TAKE 1 CAPSULE BY MOUTH EVERY DAY AFTER SUPPER   Vitamin D (Cholecalciferol) 25 MCG (1000 UT) Caps Take by mouth daily.        Allergies:  Allergies  Allergen Reactions   Ramipril     Other reaction(s): Other (See Comments) Hiccups    Empagliflozin     Other reaction(s): Other (See Comments) dysuria    Family History: Family History  Problem Relation Age of Onset   Kidney disease Neg Hx    Prostate cancer Neg Hx    Bladder Cancer Neg Hx    Kidney cancer Neg Hx     Social History:  reports that he quit smoking about 37 years ago. His smoking use included cigarettes. He has a 37.50 pack-year smoking history. He has never used smokeless tobacco. He reports that he does not drink alcohol and does not use drugs.  ROS: For pertinent review of systems please refer to history of present illness  Physical Exam: BP 134/60   Pulse 69   Ht 5' 11" (1.803 m)   Wt 259 lb (117.5 kg)   BMI 36.12 kg/m   Constitutional:  Well nourished. Alert and oriented, No acute distress. HEENT: [place] AT, moist mucus membranes.  Trachea midline Cardiovascular: No clubbing, cyanosis, or edema. Respiratory: Normal respiratory effort, no increased work of breathing. GU: No CVA tenderness.  No bladder fullness or masses.  Patient with circumcised phallus.  Urethral meatus is patent.  No penile discharge. No penile lesions or rashes. Scrotum without lesions, cysts, rashes and/or edema.  Testicles are located scrotally bilaterally. No masses are appreciated in the testicles. Left and right epididymis  are normal. Rectal: Patient with  normal sphincter tone. Anus and perineum without scarring or rashes. No rectal masses are appreciated. Prostate is approximately 35 grams, with the apex was palpated, no nodules are appreciated. Seminal vesicles could not be palpated.   Skin: There is some skin breakdown in the folds  of the buttocks, but I did not see any frank bleeding Neurologic: Grossly intact, no focal deficits, moving all 4 extremities. Psychiatric: Normal mood and affect.   Laboratory Data: Hemoglobin A1C 4.2 - 5.6 % 7.1 High    Average Blood Glucose (Calc) mg/dL 157   Resulting [place] - LAB  Narrative Performed by [facility] - LAB Normal Range:    4.2 - 5.6%  Increased Risk:  5.7 - 6.4%  Diabetes:        >= 6.5%  Glycemic Control for adults with diabetes:  <7%    Specimen Collected: 06/23/22 08:05   Performed by: [place]: 06/23/22 13:46  Received From: [facility]  Result Received: 08/18/22 12:03   Glucose 70 - 110 mg/dL 141 High    Sodium 136 - 145 mmol/L 142   Potassium 3.6 - 5.1 mmol/L 4.4   Chloride 97 - 109 mmol/L 108   Carbon Dioxide (CO2) 22.0 - 32.0 mmol/L 26.1   Urea Nitrogen (BUN) 7 - 25 mg/dL 18   Creatinine 0.7 - 1.3 mg/dL 1.2   Glomerular Filtration Rate (eGFR) >60 mL/min/1.73sq m 59 Low    Calcium 8.7 - 10.3 mg/dL 8.9   AST  8 - 39 U/L 15   ALT  6 - 57 U/L 16   Alk Phos (alkaline Phosphatase) 34 - 104 U/L 106 High    Albumin 3.5 - 4.8 g/dL 3.6   Bilirubin, Total 0.3 - 1.2 mg/dL 0.7   Protein, Total 6.1 - 7.9 g/dL 5.7 Low    A/G Ratio 1.0 - 5.0 gm/dL 1.7   Resulting Agency  [facility] - LAB   Specimen Collected: 06/23/22 08:05   Performed by: [place]: 06/23/22 15:44  Received From: [facility]  Result Received: 08/18/22 12:03   Cholesterol, Total 100 - 200 mg/dL 97 Low    Triglyceride 35 - 199 mg/dL 107   HDL  (High Density Lipoprotein) Cholesterol 29.0 - 71.0 mg/dL 33.6   LDL Calculated 0 - 130 mg/dL 42   VLDL Cholesterol mg/dL 21   Cholesterol/HDL Ratio  2.9   Resulting Agency  [facility] - LAB   Specimen Collected: 06/23/22 08:05   Performed by: [place]: 06/23/22 15:49  Received From: [facility]  Result Received: 08/18/22 12:03  I have reviewed the labs.   Assessment & Plan:    1. BPH with LUTS -continue conservative management, avoiding bladder irritants and timed voiding's -We discussed the mechanism of action for both the tamsulosin and the finasteride -At this time, we will discontinue both the tamsulosin and the finasteride and he will follow-up in 3 months for IPSS and PVR -Reviewed the signs of worsening lower urinary tract symptoms and to contact us or restart the medication if they should experience that before the 3 months  2. Blood on toilet seat -May be from skin irritation -Encouraged her to reach out to the PCP and notify them as well to see if further follow-up is needed -He was not able to give a urinalysis today, but he will give a urinalysis upon his return in 3 month -His wife will notify us if she sees any gross hematuria    Return in about 3 months (around 11/25/2022) for IPSS, UA and PVR.  Royden Purl  [facility] [address]  [place], [place] [address] [phone]

## 2022-08-25 ENCOUNTER — Encounter: Payer: Self-pay | Admitting: Urology

## 2022-08-25 ENCOUNTER — Ambulatory Visit: Payer: Medicare Other | Admitting: Urology

## 2022-08-25 VITALS — BP 134/60 | HR 69 | Ht 71.0 in | Wt 259.0 lb

## 2022-08-25 DIAGNOSIS — R58 Hemorrhage, not elsewhere classified: Secondary | ICD-10-CM | POA: Diagnosis not present

## 2022-08-25 DIAGNOSIS — N401 Enlarged prostate with lower urinary tract symptoms: Secondary | ICD-10-CM | POA: Diagnosis not present

## 2022-08-25 DIAGNOSIS — N138 Other obstructive and reflux uropathy: Secondary | ICD-10-CM

## 2022-11-21 ENCOUNTER — Other Ambulatory Visit: Payer: Self-pay

## 2022-11-21 DIAGNOSIS — N138 Other obstructive and reflux uropathy: Secondary | ICD-10-CM

## 2022-11-21 NOTE — Progress Notes (Signed)
11/24/2022 9:05 AM   James [name] 1946-06-16 [id number]  Referring provider: Mickey Farber, MD [address] [place],  [place] [address]  Urological history: 1. BPH with LU TS -PSA (08/2020) 0.16 -I PSS 3/0 -PVR 0 mL -tamsulosin 0.4 mg daily and finasteride 5 mg daily  2. ED -contributing factors of age, BPH, stroke, CAD, MI, sleep apnea, diabetes and smoking history -no longer sexually active  Chief Complaint  Patient presents with   Benign Prostatic Hypertrophy    HPI: James [name] is a 77 y.o. male who presents today for three month follow up with his wife, James [name].   At his visit on 08/25/2022, James [name] is suffering from dementia, so his history is given by his wife.   She has been noticing smears of blood on the toilet seat.  She feels it is likely coming from skin breakdown as she has not seen evidence of gross hematuria or blood in the stool.  Patient denies any modifying or aggravating factors.  Patient denies any gross hematuria, dysuria or suprapubic/flank pain.  Patient denies any fevers, chills, nausea or vomiting.  Their PCP is also asking if they could discontinue the tamsulosin and the finasteride at this time.  She could not elaborate as to why they are wanting him to stop these medications other than reducing medications.  We discontinued the tamsulosin and finasteride and asked that it follow-up in 3 months to reevaluate.    He has not had any worsening of the urinary symptoms since he has stopped the tamsulosin and the finasteride.  Patient denies any modifying or aggravating factors.  Patient denies any gross hematuria, dysuria or suprapubic/flank pain.  Patient denies any fevers, chills, nausea or vomiting.    I PSS 3/0  PVR 0 mL   UA yellow clear, SG 1.020, pH 5.5, trace leuk, 0-5 squames, 6-10 WBC's, 0-5 RBC's, few bacteria and present mucus.     IPSS     Row Name 11/24/22 0800         International Prostate Symptom Score   How often have  you had the sensation of not emptying your bladder? Less than 1 in 5     How often have you had to urinate less than every two hours? Not at All     How often have you found you stopped and started again several times when you urinated? Not at All     How often have you found it difficult to postpone urination? Less than 1 in 5 times     How often have you had a weak urinary stream? Not at All     How often have you had to strain to start urination? Not at All     How many times did you typically get up at night to urinate? 1 Time     Total IPSS Score 3       Quality of Life due to urinary symptoms   If you were to spend the rest of your life with your urinary condition just the way it is now how would you feel about that? Delighted                 Score:  1-7 Mild 8-19 Moderate 20-35 Severe  PMH: Past Medical History:  Diagnosis Date   BPH (benign prostatic hyperplasia)    Erectile dysfunction    Erectile dysfunction    Heart attack ([facility])    Hypogonadism in male    Stroke [facility])  Surgical History: Past Surgical History:  Procedure Laterality Date   abdominal cyst surgery     cardiac bypass     3 stents puts in    Home Medications:  Allergies as of 11/24/2022       Reactions   Ramipril    Other reaction(s): Other (See Comments) Hiccups    Empagliflozin    Other reaction(s): Other (See Comments) dysuria        Medication List        Accurate as of November 24, 2022  9:05 AM. If you have any questions, ask your nurse or doctor.          STOP taking these medications    diphenhydrAMINE 25 mg capsule Commonly known as: BENADRYL Stopped by: Leonell Lobdell, PA-C   finasteride 5 MG tablet Commonly known as: PROSCAR Stopped by: Syble Picco, PA-C   ramipril 10 MG capsule Commonly known as: ALTACE Stopped by: Zillah Alexie, PA-C   tamsulosin 0.4 MG Caps capsule Commonly known as: FLOMAX Stopped by: Yaneli Keithley, PA-C       TAKE  these medications    allopurinol 100 MG tablet Commonly known as: ZYLOPRIM Take 1 tablet by mouth daily.   aspirin 81 MG tablet Take 81 mg by mouth daily.   atorvastatin 80 MG tablet Commonly known as: LIPITOR Take 1 tablet by mouth.   colchicine 0.6 MG tablet Take 2 tablets (1.2mg ) by mouth at first sign of gout flare followed by 1 tablet (0.6mg ) after 1 hour. (Max 1.8mg  within 1 hour)   cyanocobalamin 1000 MCG/ML injection Commonly known as: VITAMIN B12 Inject into the muscle.   donepezil 10 MG tablet Commonly known as: ARICEPT TAKE 1 TABLET BY MOUTH EVERY DAY AT NIGHT   Eliquis 5 MG Tabs tablet Generic drug: apixaban Take 5 mg by mouth 2 (two) times daily.   memantine 10 MG tablet Commonly known as: NAMENDA Take 10 mg by mouth 2 (two) times daily.   metFORMIN 500 MG tablet Commonly known as: GLUCOPHAGE Take by mouth.   metoprolol succinate 25 MG 24 hr tablet Commonly known as: TOPROL-XL Take 25 mg by mouth daily.   nitroGLYCERIN 0.4 MG SL tablet Commonly known as: NITROSTAT Place under the tongue as needed.   sertraline 50 MG tablet Commonly known as: ZOLOFT Take 1 tablet by mouth daily.   Vitamin D (Cholecalciferol) 25 MCG (1000 UT) Caps Take by mouth daily.        Allergies:  Allergies  Allergen Reactions   Ramipril     Other reaction(s): Other (See Comments) Hiccups    Empagliflozin     Other reaction(s): Other (See Comments) dysuria    Family History: Family History  Problem Relation Age of Onset   Kidney disease Neg Hx    Prostate cancer Neg Hx    Bladder Cancer Neg Hx    Kidney cancer Neg Hx     Social History:  reports that he quit smoking about 38 years ago. His smoking use included cigarettes. He has a 37.50 pack-year smoking history. He has never used smokeless tobacco. He reports that he does not drink alcohol and does not use drugs.  ROS: For pertinent review of systems please refer to history of present illness  Physical  Exam: BP (!) 136/55 (BP Location: Left Arm, Patient Position: Sitting, Cuff Size: Large)   Pulse 64   Ht 5\' 11"  (1.803 m)   Wt 248 lb 6.4 oz (112.7 kg)   BMI 34.64 kg/m  Constitutional:  Well nourished. Alert and oriented, No acute distress. HEENT: [place] AT, moist mucus membranes.  Trachea midline Cardiovascular: No clubbing, cyanosis, or edema. Respiratory: Normal respiratory effort, no increased work of breathing. Neurologic: Grossly intact, no focal deficits, moving all 4 extremities. Psychiatric: Normal mood and affect.   Laboratory Data: Urinalysis See EPIC and HPI  I have reviewed the labs.   Assessment & Plan:    1. BPH with LU TS -UA benign -I PSS 3/0 -PVR demonstrates adequate emptying  -doing well w/o BPH meds  2. Blood on toilet seat -wife is certain that the blood is from skin breakdown   Return in about 1 year (around 11/25/2023) for I PSS and PVR .  Cloretta Ned  [facility] [address] [place], [place] [address] [phone]

## 2022-11-24 ENCOUNTER — Telehealth: Payer: Self-pay | Admitting: Family Medicine

## 2022-11-24 ENCOUNTER — Other Ambulatory Visit
Admission: RE | Admit: 2022-11-24 | Discharge: 2022-11-24 | Disposition: A | Discharge status: Home / Self Care | Payer: Medicare Other | Attending: Urology | Admitting: Urology

## 2022-11-24 ENCOUNTER — Encounter: Payer: Self-pay | Admitting: Urology

## 2022-11-24 ENCOUNTER — Ambulatory Visit: Payer: Medicare Other | Admitting: Urology

## 2022-11-24 VITALS — BP 136/55 | HR 64 | Ht 71.0 in | Wt 248.4 lb

## 2022-11-24 DIAGNOSIS — N401 Enlarged prostate with lower urinary tract symptoms: Secondary | ICD-10-CM | POA: Insufficient documentation

## 2022-11-24 DIAGNOSIS — N138 Other obstructive and reflux uropathy: Secondary | ICD-10-CM | POA: Diagnosis not present

## 2022-11-24 LAB — URINALYSIS, COMPLETE (UACMP) WITH MICROSCOPIC
Bilirubin Urine: NEGATIVE
Glucose, UA: NEGATIVE mg/dL
Hgb urine dipstick: NEGATIVE
Ketones, ur: NEGATIVE mg/dL
Nitrite: NEGATIVE
Protein, ur: NEGATIVE mg/dL
Specific Gravity, Urine: 1.02 (ref 1.005–1.030)
pH: 5.5 (ref 5.0–8.0)

## 2022-11-24 LAB — BLADDER SCAN AMB NON-IMAGING

## 2022-11-24 NOTE — Telephone Encounter (Signed)
Patient's wife notified and voiced understanding.

## 2022-11-24 NOTE — Telephone Encounter (Signed)
-----   Message from Nori Riis, PA-C sent at 11/24/2022 10:06 AM EST ----- Please let Mrs. Purington know that his urine was benign.

## 2023-05-27 ENCOUNTER — Emergency Department: Payer: Medicare Other

## 2023-05-27 ENCOUNTER — Encounter: Payer: Self-pay | Admitting: Emergency Medicine

## 2023-05-27 ENCOUNTER — Emergency Department
Admission: EM | Admit: 2023-05-27 | Discharge: 2023-05-27 | Disposition: A | Discharge status: Home / Self Care | Payer: Medicare Other | Attending: Emergency Medicine | Admitting: Emergency Medicine

## 2023-05-27 ENCOUNTER — Other Ambulatory Visit: Payer: Self-pay

## 2023-05-27 DIAGNOSIS — Z8673 Personal history of transient ischemic attack (TIA), and cerebral infarction without residual deficits: Secondary | ICD-10-CM | POA: Insufficient documentation

## 2023-05-27 DIAGNOSIS — R791 Abnormal coagulation profile: Secondary | ICD-10-CM | POA: Diagnosis not present

## 2023-05-27 DIAGNOSIS — H538 Other visual disturbances: Secondary | ICD-10-CM | POA: Diagnosis present

## 2023-05-27 DIAGNOSIS — I6782 Cerebral ischemia: Secondary | ICD-10-CM | POA: Diagnosis not present

## 2023-05-27 DIAGNOSIS — I491 Atrial premature depolarization: Secondary | ICD-10-CM | POA: Insufficient documentation

## 2023-05-27 DIAGNOSIS — H539 Unspecified visual disturbance: Secondary | ICD-10-CM

## 2023-05-27 LAB — CBC
HCT: 39.5 % (ref 39.0–52.0)
Hemoglobin: 12.8 g/dL — ABNORMAL LOW (ref 13.0–17.0)
MCH: 26.7 pg (ref 26.0–34.0)
MCHC: 32.4 g/dL (ref 30.0–36.0)
MCV: 82.3 fL (ref 80.0–100.0)
Platelets: 168 10*3/uL (ref 150–400)
RBC: 4.8 MIL/uL (ref 4.22–5.81)
RDW: 15.2 % (ref 11.5–15.5)
WBC: 6.3 10*3/uL (ref 4.0–10.5)
nRBC: 0 % (ref 0.0–0.2)

## 2023-05-27 LAB — DIFFERENTIAL
Abs Immature Granulocytes: 0.01 10*3/uL (ref 0.00–0.07)
Basophils Absolute: 0 10*3/uL (ref 0.0–0.1)
Basophils Relative: 1 %
Eosinophils Absolute: 0.2 10*3/uL (ref 0.0–0.5)
Eosinophils Relative: 3 %
Immature Granulocytes: 0 %
Lymphocytes Relative: 22 %
Lymphs Abs: 1.4 10*3/uL (ref 0.7–4.0)
Monocytes Absolute: 0.4 10*3/uL (ref 0.1–1.0)
Monocytes Relative: 6 %
Neutro Abs: 4.4 10*3/uL (ref 1.7–7.7)
Neutrophils Relative %: 68 %

## 2023-05-27 LAB — COMPREHENSIVE METABOLIC PANEL
ALT: 17 U/L (ref 0–44)
AST: 17 U/L (ref 15–41)
Albumin: 3.3 g/dL — ABNORMAL LOW (ref 3.5–5.0)
Alkaline Phosphatase: 116 U/L (ref 38–126)
Anion gap: 7 (ref 5–15)
BUN: 19 mg/dL (ref 8–23)
CO2: 24 mmol/L (ref 22–32)
Calcium: 8.9 mg/dL (ref 8.9–10.3)
Chloride: 112 mmol/L — ABNORMAL HIGH (ref 98–111)
Creatinine, Ser: 0.94 mg/dL (ref 0.61–1.24)
GFR, Estimated: >60 mL/min (ref >60)
Glucose, Bld: 173 mg/dL — ABNORMAL HIGH (ref 70–99)
Potassium: 4.1 mmol/L (ref 3.5–5.1)
Sodium: 143 mmol/L (ref 135–145)
Total Bilirubin: 0.8 mg/dL (ref 0.3–1.2)
Total Protein: 6.4 g/dL — ABNORMAL LOW (ref 6.5–8.1)

## 2023-05-27 LAB — APTT: aPTT: 32 seconds (ref 24–36)

## 2023-05-27 LAB — PROTIME-INR
INR: 1.4 — ABNORMAL HIGH (ref 0.8–1.2)
Prothrombin Time: 17 seconds — ABNORMAL HIGH (ref 11.4–15.2)

## 2023-05-27 LAB — ETHANOL: Alcohol, Ethyl (B): <10 mg/dL (ref <10)

## 2023-05-27 MED ORDER — SODIUM CHLORIDE 0.9% FLUSH: 3.0000 mL | Freq: Once | INTRAVENOUS | Status: DC

## 2023-05-27 MED ORDER — LORAZEPAM 2 MG/ML IJ SOLN
0.5000 mg | Freq: Once | INTRAMUSCULAR | Status: AC
  Administered 2023-05-27: 0.5 mg via INTRAVENOUS
  Filled 2023-05-27: qty 1

## 2023-05-27 NOTE — ED Notes (Signed)
Pt talking to MRI.

## 2023-05-27 NOTE — ED Notes (Signed)
First Nurse Note: Pt to ED via PV. Pt wife reports pt has been dealing with multiple medical problems recently. Pt woke up this morning around 0730 with weakness and blurred vision. Pt went to bed normal last night around 10pm

## 2023-05-27 NOTE — ED Notes (Signed)
Pt to MRI

## 2023-05-27 NOTE — ED Provider Notes (Signed)
[facility] Provider Note    Event Date/Time   First MD Initiated Contact with Patient 05/27/23 1128     (approximate)   History   Blurred Vision   HPI  James [name] is a 77 y.o. male with a history of CVA who presents with complaints of change in vision that occurred this morning.  Patient is somewhat of a poor historian given prior CVA, wife is providing significant history.  She reports that he complained of visual changes this morning, she does not think that they were present when he went to bed last night.  Does have a history of a CVA which affected his peripheral vision in the right eye.     Physical Exam   Triage Vital Signs: ED Triage Vitals [05/27/23 1110]  Enc Vitals Group     BP 136/65     Pulse Rate 68     Resp 18     Temp 97.7 F (36.5 C)     Temp Source Oral     SpO2 98 %     Weight 114.3 kg (252 lb)     Height 1.803 m (5\' 11" )     Head Circumference      Peak Flow      Pain Score 0     Pain Loc      Pain Edu?      Excl. in GC?     Most recent vital signs: Vitals:   05/27/23 1330 05/27/23 1500  BP: (!) 140/73 (!) 150/75  Pulse: 61   Resp: (!) 22   Temp:    SpO2:       General: Awake, no distress.  CV:  Good peripheral perfusion.  Resp:  Normal effort.  Abd:  No distention.  Other:  PERRLA, EOMI, cranial nerves appear to be intact, certainly has some visual acuity changes between the right and left eye with his left eye being much stronger   ED Results / Procedures / Treatments   Labs (all labs ordered are listed, but only abnormal results are displayed) Labs Reviewed  CBC - Abnormal; Notable for the following components:      Result Value   Hemoglobin 12.8 (*)    All other components within normal limits  COMPREHENSIVE METABOLIC PANEL - Abnormal; Notable for the following components:   Chloride 112 (*)    Glucose, Bld 173 (*)    Total Protein 6.4 (*)    Albumin 3.3 (*)    All other components within  normal limits  PROTIME-INR - Abnormal; Notable for the following components:   Prothrombin Time 17.0 (*)    INR 1.4 (*)    All other components within normal limits  DIFFERENTIAL  ETHANOL  APTT  I-STAT CREATININE, ED  CBG MONITORING, ED     EKG ED ECG REPORT I, Jene Every, the attending physician, personally viewed and interpreted this ECG.  Date: 05/27/2023  Rhythm: normal sinus rhythm QRS Axis: normal Intervals: normal ST/T Wave abnormalities: normal Narrative Interpretation: no evidence of acute ischemia    RADIOLOGY CT head viewed interpret by me, no ICH    PROCEDURES:  Critical Care performed:   Procedures   MEDICATIONS ORDERED IN ED: Medications  sodium chloride flush (NS) 0.9 % injection 3 mL (3 mLs Intravenous Not Given 05/27/23 1501)  LORazepam (ATIVAN) injection 0.5 mg (0.5 mg Intravenous Given 05/27/23 1335)     IMPRESSION / MDM / ASSESSMENT AND PLAN / ED COURSE  I reviewed the  triage vital signs and the nursing notes. Patient's presentation is most consistent with acute presentation with potential threat to life or bodily function.  Patient presents with change in vision as detailed above, differential includes CVA, retinal detachment, cataract, vision changes of aging  CT scan obtained, no code stroke activated given unclear when symptoms started precisely  CT is reassuring, lab work is unremarkable.  Given description and concern for CVA obtained MRI of the brain  MRI is negative for CVA, do not feel the patient would benefit from admission at this time given reassuring MRI, will refer the patient to his ophthalmologist for further eval patient        FINAL CLINICAL IMPRESSION(S) / ED DIAGNOSES   Final diagnoses:  Vision changes     Rx / DC Orders   ED Discharge Orders     None        Note:  This document was prepared using Dragon voice recognition software and may include unintentional dictation errors.   Jene Every, MD 05/27/23 [phone]

## 2023-05-27 NOTE — ED Triage Notes (Signed)
Patient to ED via Pov for blurred vision- with flashing lights when he woke up this AM around 0730 but went to bed around 2130 last PM. Also have more difficulties walking than normal. Hx of stroke and dementia.

## 2023-07-22 HISTORY — PX: RETINAL DETACHMENT SURGERY: SHX105

## 2023-08-16 ENCOUNTER — Other Ambulatory Visit: Payer: Self-pay | Admitting: Urology

## 2023-08-16 DIAGNOSIS — N39498 Other specified urinary incontinence: Secondary | ICD-10-CM

## 2023-08-16 NOTE — Progress Notes (Signed)
08/17/2023 2:50 PM   James [name] 05-Apr-1946 [id number]  Referring provider: Mickey Farber, MD [address] [place],  [place] [address]  Urological history: 1. BPH with LU TS -PSA (08/2020) 0.16 -tamsulosin 0.4 mg daily and finasteride 5 mg daily  2. ED -contributing factors of age, BPH, stroke, CAD, MI, sleep apnea, diabetes and smoking history -no longer sexually active  Chief Complaint  Patient presents with   Benign Prostatic Hypertrophy   Urinary Incontinence    HPI: James [name] is a 77 y.o. male who presents today for worsening incontinence with his wife, James [name].    Previous records reviewed   History obtained from his wife.  Provide surgery in the early part of September for retinal detachment, his wife has noticed an increase in his incontinence.  He is managing it with depends.  She is also noted some confusion and foul odor to the urine.  She has seen a pink tinge in the depends from time to time, but she cannot be certain whether or not it is from the urine or the stool.  Patient denies any modifying or aggravating factors.  Patient denies any recent UTI's, gross hematuria, dysuria or suprapubic/flank pain.  Patient denies any fevers, chills, nausea or vomiting.    PVR demonstrates adequate emptying.   UA with pyuria, micro heme, bacteria and WBC clumps present  PMH: Past Medical History:  Diagnosis Date   BPH (benign prostatic hyperplasia)    Erectile dysfunction    Erectile dysfunction    Heart attack (HCC)    Hypogonadism in male    Stroke [facility])     Surgical History: Past Surgical History:  Procedure Laterality Date   abdominal cyst surgery     cardiac bypass     3 stents puts in    Home Medications:  Allergies as of 08/17/2023       Reactions   Ramipril    Other reaction(s): Other (See Comments) Hiccups Other Reaction(s): Other (See Comments) Other reaction(s): Other (See Comments) Hiccups     Hiccups   Empagliflozin  Other (See Comments)   Other reaction(s): Other (See Comments) dysuria Other reaction(s): Other (See Comments) dysuria    dysuria        Medication List        Accurate as of August 17, 2023  2:50 PM. If you have any questions, ask your nurse or doctor.          allopurinol 100 MG tablet Commonly known as: ZYLOPRIM Take 1 tablet by mouth daily.   aspirin 81 MG tablet Take 81 mg by mouth daily.   atorvastatin 80 MG tablet Commonly known as: LIPITOR Take 1 tablet by mouth.   colchicine 0.6 MG tablet Take 2 tablets (1.2mg ) by mouth at first sign of gout flare followed by 1 tablet (0.6mg ) after 1 hour. (Max 1.8mg  within 1 hour)   cyanocobalamin 1000 MCG tablet Commonly known as: VITAMIN B12 Take 1,000 mcg by mouth daily. What changed: Another medication with the same name was removed. Continue taking this medication, and follow the directions you see here. Changed by: James [name]   donepezil 10 MG tablet Commonly known as: ARICEPT TAKE 1 TABLET BY MOUTH EVERY DAY AT NIGHT   Eliquis 5 MG Tabs tablet Generic drug: apixaban Take 5 mg by mouth 2 (two) times daily.   memantine 10 MG tablet Commonly known as: NAMENDA Take 10 mg by mouth 2 (two) times daily.   metFORMIN 500 MG 24 hr  tablet Commonly known as: GLUCOPHAGE-XR Take 500 mg by mouth daily. What changed: Another medication with the same name was removed. Continue taking this medication, and follow the directions you see here. Changed by: James [name]   metoprolol succinate 25 MG 24 hr tablet Commonly known as: TOPROL-XL Take 25 mg by mouth daily.   neomycin-polymyxin b-dexamethasone 3.5-10000-0.1 Oint Commonly known as: MAXITROL Place 1 Application into the right eye 4 (four) times daily.   nitroGLYCERIN 0.4 MG SL tablet Commonly known as: NITROSTAT Place under the tongue as needed.   ofloxacin 0.3 % ophthalmic solution Commonly known as: OCUFLOX Place 1 drop into the right eye 4 (four)  times daily.   prednisoLONE acetate 1 % ophthalmic suspension Commonly known as: PRED FORTE Place 1 drop into the right eye 4 (four) times daily.   sertraline 50 MG tablet Commonly known as: ZOLOFT Take 1 tablet by mouth daily.   Vitamin D (Cholecalciferol) 25 MCG (1000 UT) Caps Take by mouth daily.        Allergies:  Allergies  Allergen Reactions   Ramipril     Other reaction(s): Other (See Comments)  Hiccups  Other Reaction(s): Other (See Comments)  Other reaction(s): Other (See Comments) Hiccups     Hiccups   Empagliflozin Other (See Comments)    Other reaction(s): Other (See Comments)  dysuria  Other reaction(s): Other (See Comments) dysuria    dysuria    Family History: Family History  Problem Relation Age of Onset   Kidney disease Neg Hx    Prostate cancer Neg Hx    Bladder Cancer Neg Hx    Kidney cancer Neg Hx     Social History:  reports that he quit smoking about 38 years ago. His smoking use included cigarettes. He started smoking about 63 years ago. He has a 37.5 pack-year smoking history. He has never used smokeless tobacco. He reports that he does not drink alcohol and does not use drugs.  ROS: For pertinent review of systems please refer to history of present illness  Physical Exam: BP 122/63 (BP Location: Left Arm, Patient Position: Sitting, Cuff Size: Large)   Pulse 77   Ht 5\' 11"  (1.803 m)   Wt 245 lb 4.8 oz (111.3 kg)   BMI 34.21 kg/m   Constitutional:  Well nourished. Alert and oriented, No acute distress. HEENT: [place] AT, moist mucus membranes.  Trachea midline Cardiovascular: No clubbing, cyanosis, or edema. Respiratory: Normal respiratory effort, no increased work of breathing. Neurologic: Grossly intact, no focal deficits, moving all 4 extremities. Psychiatric: Normal mood and affect.   Laboratory Data: Basic Metabolic Panel (BMP) Order: [id number] Component Ref Range & Units 1 mo ago  Glucose 70 - 110 mg/dL 413 High    Sodium 244 - 145 mmol/L 144  Potassium 3.6 - 5.1 mmol/L 4.4  Chloride 97 - 109 mmol/L 111 High   Carbon Dioxide (CO2) 22.0 - 32.0 mmol/L 28.2  Calcium 8.7 - 10.3 mg/dL 8.8  Urea Nitrogen (BUN) 7 - 25 mg/dL 15  Creatinine 0.7 - 1.3 mg/dL 1.0  Glomerular Filtration Rate (eGFR) >60 mL/min/1.73sq m 78  Comment: CKD-EPI (2021) does not include patient's race in the calculation of eGFR.  Monitoring changes of plasma creatinine and eGFR over time is useful for monitoring kidney function.  Interpretive Ranges for eGFR (CKD-EPI 2021):  eGFR:       >60 mL/min/1.73 sq. m - Normal eGFR:       30-59 mL/min/1.73 sq. m - Moderately Decreased eGFR:  15-29 mL/min/1.73 sq. m  - Severely Decreased eGFR:       < 15 mL/min/1.73 sq. m  - Kidney Failure   Note: These eGFR calculations do not apply in acute situations when eGFR is changing rapidly or patients on dialysis.  BUN/Crea Ratio 6.0 - 20.0 15.0  Anion Gap w/K 6.0 - 16.0 9.2  Resulting Agency [facility]   Specimen Collected: 06/30/23 08:12   Performed by: [facility] Last Resulted: 06/30/23 16:14  Received From: [facility]  Result Received: 08/06/23 10:19    Hemoglobin A1C Order: [id number] Component Ref Range & Units 1 mo ago  Hemoglobin A1C 4.2 - 5.6 % 6.7 High   Average Blood Glucose (Calc) mg/dL 025  Resulting Agency [facility]  Narrative Performed by [facility] Normal Range:    4.2 - 5.6% Increased Risk:  5.7 - 6.4% Diabetes:        >= 6.5% Glycemic Control for adults with diabetes:  <7%    Specimen Collected: 06/30/23 08:12   Performed by: [facility] Last Resulted: 06/30/23 13:45  Received From: [facility]  Result Received: 08/06/23 10:19   Hepatic Function Panel (HFP) Order: [id number] Component Ref Range & Units 1 mo ago  Protein, Total 6.1 - 7.9 g/dL 5.7 Low   Albumin 3.5 - 4.8 g/dL 3.6   Bilirubin, Total 0.3 - 1.2 mg/dL 0.5  Bilirubin, Conjugated 0.00 - 0.20 mg/dL 2.83  Alk Phos (alkaline Phosphatase) 34 - 104 U/L 113 High   AST 8 - 39 U/L 12  ALT 6 - 57 U/L 16  Resulting Agency [facility]   Specimen Collected: 06/30/23 08:12   Performed by: [facility] Last Resulted: 06/30/23 14:40  Received From: [facility]  Result Received: 08/06/23 10:19   Lipid Panel w/calc LDL Order: [id number] Component Ref Range & Units 1 mo ago  Cholesterol, Total 100 - 200 mg/dL 89 Low   Triglyceride 35 - 199 mg/dL 90  HDL (High Density Lipoprotein) Cholesterol 29.0 - 71.0 mg/dL 37.1  LDL Calculated 0 - 130 mg/dL 38  VLDL Cholesterol mg/dL 18  Cholesterol/HDL Ratio 2.7  Resulting Agency [facility]   Specimen Collected: 06/30/23 08:12   Performed by: [facility] Last Resulted: 06/30/23 14:40  Received From: [facility]  Result Received: 08/06/23 10:19   Urinalysis Component     Latest Ref Rng 08/17/2023  Color, Urine     YELLOW  YELLOW   Appearance     CLEAR  CLOUDY !   Specific Gravity, Urine     1.005 - 1.030  1.025   pH     5.0 - 8.0  5.5   Glucose, UA     NEGATIVE mg/dL NEGATIVE   Hgb urine dipstick     NEGATIVE  MODERATE !   Bilirubin Urine     NEGATIVE  NEGATIVE   Ketones, ur     NEGATIVE mg/dL NEGATIVE   Protein     NEGATIVE mg/dL 30 !   Nitrite     NEGATIVE  NEGATIVE   Leukocytes,Ua     NEGATIVE  SMALL !   Squamous Epithelial / HPF     0 - 5 /HPF 0-5   WBC, UA     0 - 5 WBC/hpf >50   RBC / HPF  0 - 5 RBC/hpf 6-10   Bacteria, UA     NONE SEEN  MANY !   WBC Clumps PRESENT     Legend: ! Abnormal I have reviewed the labs.   Assessment & Plan:    1. Incontinence -UA grossly infected  -Urine sent for culture -PVR demonstrates adequate emptying -We discussed how a urinary tract infection may increase incontinent episodes -Will wait on the  culture results and prescribe antibiotic if it is indicated -If urine culture is negative, she will start giving him Gemtesa sent 5 mg samples and they will pick them up at the front desk -If urine culture is positive, we will treat with appropriate antibiotics and follow-up in 1 month  2. BPH with LU TS --PVR demonstrates adequate emptying  -doing well w/o BPH meds    Return for pending urine culture results .  Cloretta Ned  [facility] [address] [place], [place] [address] [phone]

## 2023-08-17 ENCOUNTER — Other Ambulatory Visit
Admission: RE | Admit: 2023-08-17 | Discharge: 2023-08-17 | Disposition: A | Discharge status: Home / Self Care | Payer: Medicare Other | Attending: Urology | Admitting: Urology

## 2023-08-17 ENCOUNTER — Encounter: Payer: Self-pay | Admitting: Urology

## 2023-08-17 ENCOUNTER — Ambulatory Visit: Payer: Medicare Other | Admitting: Urology

## 2023-08-17 VITALS — BP 122/63 | HR 77 | Ht 71.0 in | Wt 245.3 lb

## 2023-08-17 DIAGNOSIS — N401 Enlarged prostate with lower urinary tract symptoms: Secondary | ICD-10-CM

## 2023-08-17 DIAGNOSIS — N138 Other obstructive and reflux uropathy: Secondary | ICD-10-CM

## 2023-08-17 DIAGNOSIS — N39498 Other specified urinary incontinence: Secondary | ICD-10-CM

## 2023-08-17 LAB — URINALYSIS, COMPLETE (UACMP) WITH MICROSCOPIC
Bilirubin Urine: NEGATIVE
Glucose, UA: NEGATIVE mg/dL
Ketones, ur: NEGATIVE mg/dL
Nitrite: NEGATIVE
Protein, ur: 30 mg/dL — AB
Specific Gravity, Urine: 1.025 (ref 1.005–1.030)
WBC, UA: >50 WBC/hpf (ref 0–5)
pH: 5.5 (ref 5.0–8.0)

## 2023-08-17 LAB — BLADDER SCAN AMB NON-IMAGING

## 2023-08-20 ENCOUNTER — Other Ambulatory Visit: Payer: Self-pay | Admitting: Urology

## 2023-08-20 LAB — URINE CULTURE: Culture: >=100000 — AB

## 2023-08-20 MED ORDER — SULFAMETHOXAZOLE-TRIMETHOPRIM 800-160 MG PO TABS
1.0000 | ORAL_TABLET | Freq: Two times a day (BID) | ORAL | 0 refills | Status: DC
Start: 2023-08-20 — End: 2023-09-14

## 2023-09-10 ENCOUNTER — Other Ambulatory Visit: Payer: Self-pay | Admitting: Urology

## 2023-09-10 DIAGNOSIS — N39498 Other specified urinary incontinence: Secondary | ICD-10-CM

## 2023-09-10 NOTE — Progress Notes (Signed)
09/14/2023 2:55 PM   James [name] December 31, 1945 [id number]  Referring provider: Lauro Regulus, MD [address] [facility] [place],  [place] [address]  Urological history: 1. BPH with LU TS -PSA (08/2020) 0.16 -tamsulosin 0.4 mg daily and finasteride 5 mg daily  2. ED -contributing factors of age, BPH, stroke, CAD, MI, sleep apnea, diabetes and smoking history -no longer sexually active  Chief Complaint  Patient presents with   Urinary Incontinence    HPI: James [name] is a 77 y.o. male who presents today for worsening incontinence with his wife, [name].    Previous records reviewed   History obtained from his wife.  At his visit on 08/17/2023, surgery in the early part of September for retinal detachment, his wife has noticed an increase in his incontinence.  He is managing it with depends.  She is also noted some confusion and foul odor to the urine.  She has seen a pink tinge in the depends from time to time, but she cannot be certain whether or not it is from the urine or the stool.  Patient denies any modifying or aggravating factors.  Patient denies any recent UTI's, gross hematuria, dysuria or suprapubic/flank pain.  Patient denies any fevers, chills, nausea or vomiting.   PVR demonstrates adequate emptying.   UA with pyuria, micro heme, bacteria and WBC clumps present.  Urine culture grew out enterobacter cloacae.    His wife states that his incontinence has improved since the antibiotic.  It is still occurring, but at a much reduced rate.  Patient denies any modifying or aggravating factors.  Patient denies any recent UTI's, gross hematuria, dysuria or suprapubic/flank pain.  Patient denies any fevers, chills, nausea or vomiting.     UA w/pyuria, hematuria, bacteria and WBC clumps.    PMH: Past Medical History:  Diagnosis Date   BPH (benign prostatic hyperplasia)    Erectile dysfunction    Erectile dysfunction    Heart attack (HCC)     Hypogonadism in male    Stroke [facility])     Surgical History: Past Surgical History:  Procedure Laterality Date   abdominal cyst surgery     cardiac bypass     3 stents puts in    Home Medications:  Allergies as of 09/14/2023       Reactions   Ramipril    Other reaction(s): Other (See Comments) Hiccups Other Reaction(s): Other (See Comments) Other reaction(s): Other (See Comments) Hiccups     Hiccups   Empagliflozin Other (See Comments)   Other reaction(s): Other (See Comments) dysuria Other reaction(s): Other (See Comments) dysuria    dysuria        Medication List        Accurate as of September 14, 2023  2:55 PM. If you have any questions, ask your nurse or doctor.          STOP taking these medications    sulfamethoxazole-trimethoprim 800-160 MG tablet Commonly known as: BACTRIM DS       TAKE these medications    allopurinol 100 MG tablet Commonly known as: ZYLOPRIM Take 1 tablet by mouth daily.   aspirin 81 MG tablet Take 81 mg by mouth daily.   atorvastatin 80 MG tablet Commonly known as: LIPITOR Take 1 tablet by mouth.   colchicine 0.6 MG tablet Take 2 tablets (1.2mg ) by mouth at first sign of gout flare followed by 1 tablet (0.6mg ) after 1 hour. (Max 1.8mg  within 1 hour)  cyanocobalamin 1000 MCG tablet Commonly known as: VITAMIN B12 Take 1,000 mcg by mouth daily.   donepezil 10 MG tablet Commonly known as: ARICEPT TAKE 1 TABLET BY MOUTH EVERY DAY AT NIGHT   Eliquis 5 MG Tabs tablet Generic drug: apixaban Take 5 mg by mouth 2 (two) times daily.   memantine 10 MG tablet Commonly known as: NAMENDA Take 10 mg by mouth 2 (two) times daily.   metFORMIN 500 MG 24 hr tablet Commonly known as: GLUCOPHAGE-XR Take 500 mg by mouth daily.   metoprolol succinate 25 MG 24 hr tablet Commonly known as: TOPROL-XL Take 25 mg by mouth daily.   neomycin-polymyxin b-dexamethasone 3.5-10000-0.1 Oint Commonly known as: MAXITROL Place 1  Application into the right eye 4 (four) times daily.   nitroGLYCERIN 0.4 MG SL tablet Commonly known as: NITROSTAT Place under the tongue as needed.   ofloxacin 0.3 % ophthalmic solution Commonly known as: OCUFLOX Place 1 drop into the right eye 4 (four) times daily.   prednisoLONE acetate 1 % ophthalmic suspension Commonly known as: PRED FORTE Place 1 drop into the right eye 4 (four) times daily.   sertraline 50 MG tablet Commonly known as: ZOLOFT Take 1 tablet by mouth daily.   Vitamin D (Cholecalciferol) 25 MCG (1000 UT) Caps Take by mouth daily.        Allergies:  Allergies  Allergen Reactions   Ramipril     Other reaction(s): Other (See Comments)  Hiccups  Other Reaction(s): Other (See Comments)  Other reaction(s): Other (See Comments) Hiccups     Hiccups   Empagliflozin Other (See Comments)    Other reaction(s): Other (See Comments)  dysuria  Other reaction(s): Other (See Comments) dysuria    dysuria    Family History: Family History  Problem Relation Age of Onset   Kidney disease Neg Hx    Prostate cancer Neg Hx    Bladder Cancer Neg Hx    Kidney cancer Neg Hx     Social History:  reports that he quit smoking about 38 years ago. His smoking use included cigarettes. He started smoking about 63 years ago. He has a 37.5 pack-year smoking history. He has never used smokeless tobacco. He reports that he does not drink alcohol and does not use drugs.  ROS: For pertinent review of systems please refer to history of present illness  Physical Exam: BP (!) 110/54   Pulse 89   Ht 5\' 11"  (1.803 m)   Wt 245 lb (111.1 kg)   BMI 34.17 kg/m   Constitutional:  Well nourished. Alert and oriented, No acute distress. HEENT: [place] AT, moist mucus membranes.  Trachea midline Cardiovascular: No clubbing, cyanosis, or edema. Respiratory: Normal respiratory effort, no increased work of breathing. GU: No CVA tenderness.  No bladder fullness or masses.  Patient with  uncircumcised phallus. Foreskin easily retracted  Urethral meatus is patent.  No penile discharge. No penile lesions or rashes.  Neurologic: Grossly intact, no focal deficits, moving all 4 extremities. Psychiatric: Normal mood and affect.   Laboratory Data: Component     Latest Ref Rng 09/14/2023  Color, Urine     YELLOW  YELLOW   Appearance     CLEAR  CLOUDY !   Specific Gravity, Urine     1.005 - 1.030  1.025   pH     5.0 - 8.0  5.5   Glucose, UA     NEGATIVE mg/dL NEGATIVE   Hgb urine dipstick     NEGATIVE  TRACE !   Bilirubin Urine     NEGATIVE  NEGATIVE   Ketones, ur     NEGATIVE mg/dL NEGATIVE   Protein     NEGATIVE mg/dL TRACE !   Nitrite     NEGATIVE  POSITIVE !   Leukocytes,Ua     NEGATIVE  LARGE !   Squamous Epithelial / HPF     0 - 5 /HPF NONE SEEN   WBC, UA     0 - 5 WBC/hpf 21-50   RBC / HPF     0 - 5 RBC/hpf 6-10   Bacteria, UA     NONE SEEN  MANY !   WBC Clumps PRESENT     Legend: ! Abnormal I have reviewed the labs.  See HPI.      Assessment & Plan:    1. Incontinence -UA w/ pyuria, hematuria and bacteria -Urine sent for culture -will prescribe antibiotic if positive -wife does not want to pursue more treatments or investigation into his symptoms citing age and co morbidities  2. BPH with LU TS -continue conservative management    Return if symptoms worsen or fail to improve.  Cloretta Ned  [facility] [address] [place], [place] [address] [phone]

## 2023-09-14 ENCOUNTER — Ambulatory Visit: Payer: Medicare Other | Admitting: Urology

## 2023-09-14 ENCOUNTER — Other Ambulatory Visit
Admission: RE | Admit: 2023-09-14 | Discharge: 2023-09-14 | Disposition: A | Discharge status: Home / Self Care | Payer: Medicare Other | Attending: Urology | Admitting: Urology

## 2023-09-14 ENCOUNTER — Encounter: Payer: Self-pay | Admitting: Urology

## 2023-09-14 VITALS — BP 110/54 | HR 89 | Ht 71.0 in | Wt 245.0 lb

## 2023-09-14 DIAGNOSIS — N138 Other obstructive and reflux uropathy: Secondary | ICD-10-CM

## 2023-09-14 DIAGNOSIS — N39498 Other specified urinary incontinence: Secondary | ICD-10-CM | POA: Diagnosis present

## 2023-09-14 DIAGNOSIS — N401 Enlarged prostate with lower urinary tract symptoms: Secondary | ICD-10-CM | POA: Diagnosis not present

## 2023-09-14 LAB — URINALYSIS, COMPLETE (UACMP) WITH MICROSCOPIC
Bilirubin Urine: NEGATIVE
Glucose, UA: NEGATIVE mg/dL
Ketones, ur: NEGATIVE mg/dL
Nitrite: POSITIVE — AB
Specific Gravity, Urine: 1.025 (ref 1.005–1.030)
Squamous Epithelial / HPF: NONE SEEN /[HPF] (ref 0–5)
pH: 5.5 (ref 5.0–8.0)

## 2023-09-16 ENCOUNTER — Other Ambulatory Visit: Payer: Self-pay | Admitting: Urology

## 2023-09-16 DIAGNOSIS — N390 Urinary tract infection, site not specified: Secondary | ICD-10-CM

## 2023-09-16 LAB — URINE CULTURE: Culture: >=100000 — AB

## 2023-09-16 MED ORDER — CEFUROXIME AXETIL 500 MG PO TABS: 500.0000 mg | ORAL_TABLET | Freq: Two times a day (BID) | ORAL | 0 refills | Status: DC

## 2023-10-06 ENCOUNTER — Ambulatory Visit
Admission: RE | Admit: 2023-10-06 | Discharge: 2023-10-06 | Disposition: A | Discharge status: Home / Self Care | Payer: Medicare Other | Source: Ambulatory Visit | Attending: Otolaryngology | Admitting: Otolaryngology

## 2023-10-06 ENCOUNTER — Ambulatory Visit
Admission: RE | Admit: 2023-10-06 | Discharge: 2023-10-06 | Disposition: A | Discharge status: Home / Self Care | Payer: Medicare Other | Attending: Otolaryngology | Admitting: Otolaryngology

## 2023-10-06 ENCOUNTER — Other Ambulatory Visit: Payer: Self-pay | Admitting: Otolaryngology

## 2023-10-06 DIAGNOSIS — R053 Chronic cough: Secondary | ICD-10-CM | POA: Insufficient documentation

## 2024-01-14 ENCOUNTER — Encounter: Payer: Self-pay | Admitting: Ophthalmology

## 2024-01-14 NOTE — Anesthesia Preprocedure Evaluation (Addendum)
 Anesthesia Evaluation  Patient identified by MRN, date of birth, ID band Patient awake    Reviewed: Allergy & Precautions, H&P , NPO status , Patient's Chart, lab work & pertinent test results  Airway Mallampati: IV  TM Distance: <3 FB Neck ROM: Full    Dental no notable dental hx. (+) Poor Dentition, Missing Patient denies loose teeth, has very poor dentition:   Pulmonary sleep apnea , former smoker Smoking status: Former  Current packs/day: 0.00  Average packs/day: 1.5 packs/day for 33.0 years (49.5 ttl pk-yrs)  Types: Cigarettes  Start date: 11/17/1965  Quit date: 11/17/1998     Pulmonary exam normal breath sounds clear to auscultation       Cardiovascular hypertension, + CAD, + Past MI and +CHF  Normal cardiovascular exam Rhythm:Irregular Rate:Normal  Cardiology note 12-28-23   The patient has baseline dementia; his wife contributesd most to the history. The patient was seen by Dr. Elenore Rota recently at which time a chest x-ray was obtained, which revealed findings consistent with CHF (mild cardiomegaly with small left and trace right pleural effusions). Upon being made aware of these findings, Dr. Harrington Challenger ordered an echocardiogram, and the patient was started on Lasix 20 mg daily.   The patient has had a recent history of increased exertional dyspnea, daytime somnolence, sleeping up to 16 hours a day, and decreased appetite.   2D echocardiogram on 02/20/2021 revealed severely decreased left ventricular systolic function with LVEF 25%, with global hypokinesis, with grade 1 diastolic dysfunction, with moderate posterior pericardial effusion, and moderate mitral regurgitation.   ECG today reveals sinus rhythm with sinus arrhythmia at a rate of 109 bpm, PVCs, with prolonged QTc of 581 ms with possible previous inferolateral infarct.  The patient was advised at a recent office visit to reduce his furosemide to 20 mg daily due to urinary  frequency and incontinence. While on the 40 mg, the patient did have an 11 pound weight loss, but overall his symptoms were unchanged. At a recent office visit, due to prolonged QT of 581 ms and an occasional ventricular run with the longest lasting 35 bpm on a Holter monitor, the patient was advised to reduce his Zoloft to 50 mg daily. Repeat ECG showed sinus rhythm at a rate of 63 bpm with PVCs, low voltage, and QTc 456 ms. This was reviewed with Dr. Juliann Pares. The patient underwent Lexiscan Myoview on 03/14/2021 for evaluation of reduced left ventricular function on recent echocardiogram. Gated scintigraphy revealed LVEF 26%. SPECT imaging revealed moderate inferolateral and apical lateral scar without significant ischemia.  Previous 72-hour Holter monitor starting on 02/21/2021 revealed predominant atrial fibrillation with a mean heart rate of 87 bpm, frequent premature ventricular contractions, occasional ventricular runs the longest being 35 beats. He was asymptomatic while wearing the monitor. He was started on Eliquis for stroke prevention with a chads vasc score of 5.   The patient returns today for a 57-month follow-up, accompanied by his wife who provides most of the history due to the patient's dementia. The patient has had an occasional cough for about 4 years, but at the last visit, she reported that it had become more frequent, and nearly constant, sometimes productive. There is no known association with occurring after meals or positions. When he wears his CPAP, he does not cough. He also has an associated runny nose and eyes. His weight had been stable and he had not had peripheral edema. He had not taken as-needed Lasix in quite some time. The patient admitted to  feeling more short of breath when walking, and his wife has noticed this change too. At that visit, he was started on Lasix 20 mg for 5 days. He had an appointment with ENT and chest xray was performed, which showed small left greater than  right pleural effusions with associated atelectasis/consolidation, similar in appearance to previous chest xray. He was then seen by his primary care provider who stopped Lasix and started him on daily torsemide. Since starting torsemide, his weight is down 16 pounds. The patient denies issues with breathing, but his wife states he still appears short of breath when he moves around in the house. He is minimally active, and sleeps for at least 16 hours during the day, and a full night of sleep. He has not had peripheral edema, presyncope, syncope, palpitations, or chest pain.   Labs on 10/26/2023 showed sodium 131, potassium 4.1, BUN 21, creatinine 1.2. Repeat chest xray on 10/26/2023 showed small left pleural effusion with left basilar atelectasis.  He remains on Eliquis without melena or hematochezia.  2D echocardiogram on 06/06/2021 revealed moderate left ventricular dysfunction with LVEF 30% with mild to moderate pericardial effusion.     Neuro/Psych  PSYCHIATRIC DISORDERS     Dementia Today, patient pleasant, cooperative, wife helping with answering questions. 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 ST elevation myocardial infarction (STEMI) of anterior wall  10/10/2015  DES prox and mid LAD, 09/19/2015, with staged DES distal RCA 09/21/2015  EF 25% Stroke  Erectile dysfunction BPH (benign prostatic hyperplasia) Erectile dysfunction Hypogonadism in male  Heart attack (HCC) Sleep apnea Type 2 diabetes mellitus  Chronic cough OSA on CPAP CAD (coronary artery disease) Mixed Alzheimer's and vascular dementia   Chronic diastolic CHF (congestive heart failure)  Asteatotic eczema Stasis dermatitis of both legs Ischemic cardiomyopathy Paroxysmal atrial fibrillation (HCC)  Type 2 diabetes mellitus with other specified complication (HCC) Hypertension associated with type 2 diabetes mellitus (HCC)  Gait disorder Gout  History of heart artery stent History of ST elevation myocardial infarction (STEMI)  Chronic cough Grade I diastolic dysfunction  Low left ventricular ejection fraction Ejection fraction < 50%      Reproductive/Obstetrics negative OB ROS                             Anesthesia Physical Anesthesia Plan  ASA: 4  Anesthesia Plan: MAC   Post-op Pain Management:    Induction: Intravenous  PONV Risk Score and Plan:   Airway Management Planned: Natural Airway and Nasal Cannula  Additional Equipment:   Intra-op Plan:   Post-operative Plan:   Informed Consent: I have reviewed the patients History and Physical, chart, labs and discussed the procedure including the risks, benefits and alternatives for the proposed anesthesia with the patient or authorized representative who has indicated his/her understanding and acceptance.     Dental Advisory Given  Plan Discussed with: Anesthesiologist, CRNA and Surgeon  Anesthesia Plan Comments: (Patient consented for risks of anesthesia including but not limited to:  - adverse reactions to medications - damage to eyes, teeth, lips or other oral mucosa -  nerve damage due to positioning  - sore throat or hoarseness - Damage to heart, brain, nerves, lungs, other parts of body or loss of life  Patient voiced understanding and assent.)       Anesthesia Quick Evaluation

## 2024-01-22 NOTE — Discharge Instructions (Signed)

## 2024-01-25 ENCOUNTER — Encounter: Payer: Self-pay | Admitting: Ophthalmology

## 2024-01-25 ENCOUNTER — Ambulatory Visit: Payer: Self-pay | Admitting: Anesthesiology

## 2024-01-25 ENCOUNTER — Ambulatory Visit
Admission: RE | Admit: 2024-01-25 | Discharge: 2024-01-25 | Disposition: A | Discharge status: Home / Self Care | Payer: Medicare Other | Attending: Ophthalmology | Admitting: Ophthalmology

## 2024-01-25 ENCOUNTER — Encounter
Admission: RE | Disposition: A | Discharge status: Home / Self Care | Payer: Self-pay | Source: Home / Self Care | Attending: Ophthalmology

## 2024-01-25 ENCOUNTER — Other Ambulatory Visit: Payer: Self-pay

## 2024-01-25 DIAGNOSIS — I5032 Chronic diastolic (congestive) heart failure: Secondary | ICD-10-CM | POA: Diagnosis not present

## 2024-01-25 DIAGNOSIS — I2510 Atherosclerotic heart disease of native coronary artery without angina pectoris: Secondary | ICD-10-CM | POA: Insufficient documentation

## 2024-01-25 DIAGNOSIS — I252 Old myocardial infarction: Secondary | ICD-10-CM | POA: Insufficient documentation

## 2024-01-25 DIAGNOSIS — I110 Hypertensive heart disease with heart failure: Secondary | ICD-10-CM | POA: Insufficient documentation

## 2024-01-25 DIAGNOSIS — Z7984 Long term (current) use of oral hypoglycemic drugs: Secondary | ICD-10-CM | POA: Insufficient documentation

## 2024-01-25 DIAGNOSIS — Z87891 Personal history of nicotine dependence: Secondary | ICD-10-CM | POA: Diagnosis not present

## 2024-01-25 DIAGNOSIS — Z8673 Personal history of transient ischemic attack (TIA), and cerebral infarction without residual deficits: Secondary | ICD-10-CM | POA: Insufficient documentation

## 2024-01-25 DIAGNOSIS — E1136 Type 2 diabetes mellitus with diabetic cataract: Secondary | ICD-10-CM | POA: Insufficient documentation

## 2024-01-25 DIAGNOSIS — F0390 Unspecified dementia without behavioral disturbance: Secondary | ICD-10-CM | POA: Diagnosis not present

## 2024-01-25 DIAGNOSIS — H2512 Age-related nuclear cataract, left eye: Secondary | ICD-10-CM | POA: Insufficient documentation

## 2024-01-25 HISTORY — DX: Other ill-defined heart diseases: I51.89

## 2024-01-25 HISTORY — DX: Gout, unspecified: M10.9

## 2024-01-25 HISTORY — DX: Type 2 diabetes mellitus without complications: E11.9

## 2024-01-25 HISTORY — DX: Sleep apnea, unspecified: G47.30

## 2024-01-25 HISTORY — DX: Type 2 diabetes mellitus with other specified complication: E11.69

## 2024-01-25 HISTORY — DX: Abnormal findings on diagnostic imaging of heart and coronary circulation: R93.1

## 2024-01-25 HISTORY — DX: Other specified dermatitis: L30.8

## 2024-01-25 HISTORY — DX: Ischemic cardiomyopathy: I25.5

## 2024-01-25 HISTORY — DX: Old myocardial infarction: I25.2

## 2024-01-25 HISTORY — DX: Presence of coronary angioplasty implant and graft: Z95.5

## 2024-01-25 HISTORY — DX: Abnormal result of cardiovascular function study, unspecified: R94.30

## 2024-01-25 HISTORY — DX: Vascular dementia, unspecified severity, without behavioral disturbance, psychotic disturbance, mood disturbance, and anxiety: F02.80

## 2024-01-25 HISTORY — DX: Paroxysmal atrial fibrillation: I48.0

## 2024-01-25 HISTORY — DX: Type 2 diabetes mellitus with other circulatory complications: I15.2

## 2024-01-25 HISTORY — DX: Obstructive sleep apnea (adult) (pediatric): G47.33

## 2024-01-25 HISTORY — DX: Atherosclerotic heart disease of native coronary artery without angina pectoris: I25.10

## 2024-01-25 HISTORY — DX: Chronic cough: R05.3

## 2024-01-25 HISTORY — PX: CATARACT EXTRACTION W/PHACO: SHX586

## 2024-01-25 HISTORY — DX: Chronic diastolic (congestive) heart failure: I50.32

## 2024-01-25 HISTORY — DX: Venous insufficiency (chronic) (peripheral): I87.2

## 2024-01-25 HISTORY — DX: Reserved for concepts with insufficient information to code with codable children: IMO0002

## 2024-01-25 HISTORY — DX: Unspecified abnormalities of gait and mobility: R26.9

## 2024-01-25 HISTORY — DX: Alzheimer's disease, unspecified: G30.9

## 2024-01-25 LAB — GLUCOSE, CAPILLARY: Glucose-Capillary: 133 mg/dL — ABNORMAL HIGH (ref 70–99)

## 2024-01-25 SURGERY — PHACOEMULSIFICATION, CATARACT, WITH IOL INSERTION
Anesthesia: Monitor Anesthesia Care | Laterality: Left

## 2024-01-25 MED ORDER — FENTANYL CITRATE (PF) 100 MCG/2ML IJ SOLN
INTRAMUSCULAR | Status: AC
  Filled 2024-01-25: qty 2

## 2024-01-25 MED ORDER — TRYPAN BLUE 0.06 % IO SOSY
PREFILLED_SYRINGE | INTRAOCULAR | Status: DC | PRN
  Administered 2024-01-25: .5 mL via INTRAOCULAR

## 2024-01-25 MED ORDER — SIGHTPATH DOSE#1 NA HYALUR & NA CHOND-NA HYALUR IO KIT
PACK | INTRAOCULAR | Status: DC | PRN
  Administered 2024-01-25: 1 via OPHTHALMIC

## 2024-01-25 MED ORDER — TETRACAINE HCL 0.5 % OP SOLN
1.0000 [drp] | OPHTHALMIC | Status: DC | PRN
  Administered 2024-01-25 (×3): 1 [drp] via OPHTHALMIC

## 2024-01-25 MED ORDER — MOXIFLOXACIN HCL 0.5 % OP SOLN
OPHTHALMIC | Status: DC | PRN
  Administered 2024-01-25: .2 mL via OPHTHALMIC

## 2024-01-25 MED ORDER — MIDAZOLAM HCL 2 MG/2ML IJ SOLN
INTRAMUSCULAR | Status: AC
  Filled 2024-01-25: qty 2

## 2024-01-25 MED ORDER — SIGHTPATH DOSE#1 BSS IO SOLN
INTRAOCULAR | Status: DC | PRN
  Administered 2024-01-25: 15 mL via INTRAOCULAR

## 2024-01-25 MED ORDER — FENTANYL CITRATE (PF) 100 MCG/2ML IJ SOLN
INTRAMUSCULAR | Status: DC | PRN
  Administered 2024-01-25: 25 ug via INTRAVENOUS

## 2024-01-25 MED ORDER — LIDOCAINE HCL (PF) 2 % IJ SOLN
INTRAOCULAR | Status: DC | PRN
  Administered 2024-01-25: 1 mL via INTRAOCULAR

## 2024-01-25 MED ORDER — SIGHTPATH DOSE#1 BSS IO SOLN
INTRAOCULAR | Status: DC | PRN
  Administered 2024-01-25: 81 mL via OPHTHALMIC

## 2024-01-25 MED ORDER — ARMC OPHTHALMIC DILATING DROPS
OPHTHALMIC | Status: AC
  Filled 2024-01-25: qty 0.5

## 2024-01-25 MED ORDER — DEXMEDETOMIDINE HCL IN NACL 80 MCG/20ML IV SOLN
INTRAVENOUS | Status: AC
  Filled 2024-01-25: qty 20

## 2024-01-25 MED ORDER — ARMC OPHTHALMIC DILATING DROPS
1.0000 | OPHTHALMIC | Status: DC | PRN
  Administered 2024-01-25 (×3): 1 via OPHTHALMIC

## 2024-01-25 MED ORDER — TETRACAINE HCL 0.5 % OP SOLN
OPHTHALMIC | Status: AC
  Filled 2024-01-25: qty 4

## 2024-01-25 SURGICAL SUPPLY — 14 items
CANNULA ANT/CHMB 27G (MISCELLANEOUS) IMPLANT
CANNULA ANT/CHMB 27GA (MISCELLANEOUS) ×1 IMPLANT
CATARACT SUITE SIGHTPATH (MISCELLANEOUS) ×1 IMPLANT
DISSECTOR HYDRO NUCLEUS 50X22 (MISCELLANEOUS) ×1 IMPLANT
FEE CATARACT SUITE SIGHTPATH (MISCELLANEOUS) ×1 IMPLANT
GLOVE PI ULTRA LF STRL 7.5 (GLOVE) ×1 IMPLANT
GLOVE SURG POLYISOPRENE 8.5 (GLOVE) ×1 IMPLANT
GLOVE SURG PROTEXIS BL SZ6.5 (GLOVE) ×1 IMPLANT
GLOVE SURG SYN 6.5 PF PI BL (GLOVE) ×1 IMPLANT
GLOVE SURG SYN 8.5 PF PI BL (GLOVE) ×1 IMPLANT
LENS IOL TECNIS EYHANCE 16.5 (Intraocular Lens) IMPLANT
NDL FILTER BLUNT 18X1 1/2 (NEEDLE) ×1 IMPLANT
NEEDLE FILTER BLUNT 18X1 1/2 (NEEDLE) ×1 IMPLANT
SYR 3ML LL SCALE MARK (SYRINGE) ×1 IMPLANT

## 2024-01-25 NOTE — Transfer of Care (Signed)
 Immediate Anesthesia Transfer of Care Note  Patient: James [name]  Procedure(s) Performed: CATARACT EXTRACTION PHACO AND INTRAOCULAR LENS PLACEMENT (IOC) LEFT DIABETIC 7.63, 00:43.3 (Left)  Patient Location: PACU  Anesthesia Type:MAC  Level of Consciousness: awake, alert , and confused  Airway & Oxygen Therapy: Patient Spontanous Breathing  Post-op Assessment: Report given to RN, Post -op Vital signs reviewed and stable, and Patient moving all extremities X 4  Post vital signs: Reviewed and stable  Last Vitals:  Vitals Value Taken Time  BP    Temp 36 C 01/25/24 0831  Pulse 71 01/25/24 0832  Resp 17 01/25/24 0832  SpO2 100 % 01/25/24 0832  Vitals shown include unfiled device data.  Last Pain:  Vitals:   01/25/24 0700  TempSrc: Temporal  PainSc: 0-No pain         Complications: No notable events documented.

## 2024-01-25 NOTE — H&P (Signed)
 [facility]   Primary Care Physician:  Lauro Regulus, MD Ophthalmologist: Dr. Willey Blade  Pre-Procedure History & Physical: HPI:  James [name] is a 78 y.o. male here for cataract surgery.   Past Medical History:  Diagnosis Date   Asteatotic eczema    BPH (benign prostatic hyperplasia)    CAD (coronary artery disease)    Chronic cough    Chronic cough    Chronic diastolic CHF (congestive heart failure) (HCC)    Ejection fraction < 50%    Erectile dysfunction    Erectile dysfunction    Gait disorder    Gout    Grade I diastolic dysfunction    Heart attack (HCC) 2016   History of heart artery stent    History of ST elevation myocardial infarction (STEMI)    Hypertension associated with type 2 diabetes mellitus (HCC)    Hypogonadism in male    Ischemic cardiomyopathy    Low left ventricular ejection fraction    Mixed Alzheimer's and vascular dementia (HCC)    OSA on CPAP    Paroxysmal atrial fibrillation (HCC)    Sleep apnea    CPAP   Stasis dermatitis of both legs    Stroke (HCC) 2009   Peripheral vision impairment   Type 2 diabetes mellitus (HCC)    Type 2 diabetes mellitus with other specified complication [facility])     Past Surgical History:  Procedure Laterality Date   abdominal cyst surgery     CARDIAC CATHETERIZATION  2016   3 stents   RETINAL DETACHMENT SURGERY Right 07/22/2023   [facility]    Prior to Admission medications   Medication Sig Start Date End Date Taking? Authorizing Provider  allopurinol (ZYLOPRIM) 100 MG tablet Take 1 tablet by mouth daily. 02/24/20  Yes [provider]  apixaban (ELIQUIS) 5 MG TABS tablet Take 5 mg by mouth 2 (two) times daily.   Yes [provider]  aspirin 81 MG tablet Take 81 mg by mouth daily.   Yes [provider]  atorvastatin (LIPITOR) 80 MG tablet Take 1 tablet by mouth. 10/04/15  Yes [provider]  cyanocobalamin (VITAMIN B12) 1000 MCG tablet Take 1,000 mcg by mouth daily.    Yes [provider]  donepezil (ARICEPT) 10 MG tablet TAKE 1 TABLET BY MOUTH EVERY DAY AT NIGHT 07/12/18  Yes [provider]  memantine (NAMENDA) 10 MG tablet Take 10 mg by mouth 2 (two) times daily. 08/03/20  Yes [provider]  metFORMIN (GLUCOPHAGE-XR) 500 MG 24 hr tablet Take 500 mg by mouth daily. 06/01/23  Yes [provider]  metoprolol succinate (TOPROL-XL) 25 MG 24 hr tablet Take 25 mg by mouth daily. 09/22/15  Yes [provider]  neomycin-polymyxin b-dexamethasone (MAXITROL) 3.5-10000-0.1 OINT Place 1 Application into the right eye 4 (four) times daily. 07/22/23  Yes [provider]  nitroGLYCERIN (NITROSTAT) 0.4 MG SL tablet Place under the tongue as needed. 06/11/20  Yes [provider]  ofloxacin (OCUFLOX) 0.3 % ophthalmic solution Place 1 drop into the right eye 4 (four) times daily. 07/22/23  Yes [provider]  POTASSIUM CHLORIDE PO Take 10 mEq by mouth daily.   Yes [provider]  prednisoLONE acetate (PRED FORTE) 1 % ophthalmic suspension Place 1 drop into the right eye 4 (four) times daily. 07/22/23  Yes [provider]  sertraline (ZOLOFT) 50 MG tablet Take 1 tablet by mouth daily.   Yes [provider]  torsemide (DEMADEX) 20 MG  tablet Take 20 mg by mouth daily.   Yes [provider]  Vitamin D, Cholecalciferol, 1000 UNITS CAPS Take by mouth daily.   Yes [provider]  colchicine 0.6 MG tablet Take 2 tablets (1.2mg ) by mouth at first sign of gout flare followed by 1 tablet (0.6mg ) after 1 hour. (Max 1.8mg  within 1 hour) Patient not taking: Reported on 01/14/2024 07/30/16   [provider]    Allergies as of 12/23/2023 - Review Complete 08/17/2023  Allergen Reaction Noted   Ramipril  12/25/2021   Empagliflozin Other (See Comments) 09/24/2021    Family History  Problem Relation Age of Onset   Kidney disease Neg Hx    Prostate cancer Neg Hx    Bladder  Cancer Neg Hx    Kidney cancer Neg Hx     Social History   Socioeconomic History   Marital status: Married    Spouse name: Not on file   Number of children: Not on file   Years of education: Not on file   Highest education level: Not on file  Occupational History   Not on file  Tobacco Use   Smoking status: Former    Current packs/day: 0.00    Average packs/day: 1.5 packs/day for 25.0 years (37.5 ttl pk-yrs)    Types: Cigarettes    Start date: 11/18/1959    Quit date: 11/17/1984    Years since quitting: 39.2   Smokeless tobacco: Never   Tobacco comments:    "years ago"  Vaping Use   Vaping status: Never Used  Substance and Sexual Activity   Alcohol use: No    Alcohol/week: 0.0 standard drinks of alcohol   Drug use: No   Sexual activity: Not on file  Other Topics Concern   Not on file  Social History Narrative   Not on file   Social Drivers of Health   Financial Resource Strain: Low Risk  (07/07/2023)   Received from [facility]   Overall Financial Resource Strain (CARDIA)    Difficulty of Paying Living Expenses: Not hard at all  Food Insecurity: No Food Insecurity (07/07/2023)   Received from [facility]   Hunger Vital Sign    Worried About Running Out of Food in the Last Year: Never true    Ran Out of Food in the Last Year: Never true  Transportation Needs: No Transportation Needs (07/07/2023)   Received from [facility] - Transportation    In the past 12 months, has lack of transportation kept you from medical appointments or from getting medications?: No    Lack of Transportation (Non-Medical): No  Physical Activity: Not on file  Stress: Not on file  Social Connections: Not on file  Intimate Partner Violence: Not on file    Review of Systems: See HPI, otherwise negative ROS  Physical Exam: BP 117/89   Temp (!) 97.4 F (36.3 C) (Temporal)   Resp 17   Ht 5\' 11"  (1.803 m)   Wt 102.1 kg   BMI  31.38 kg/m  General:   Alert, cooperative in NAD Head:  Normocephalic and atraumatic. Respiratory:  Normal work of breathing. Cardiovascular:  RRR  Impression/Plan: James [name] is here for cataract surgery.  Has dementia, #1 HCPOA is wife [name].  Risks, benefits, limitations, and alternatives regarding cataract surgery have been reviewed with the patient.  Questions have been answered.  All parties agreeable.   Willey Blade, MD  01/25/2024, 7:54 AM

## 2024-01-25 NOTE — Anesthesia Postprocedure Evaluation (Signed)
 Anesthesia Post Note  Patient: Briston Lax  Procedure(s) Performed: CATARACT EXTRACTION PHACO AND INTRAOCULAR LENS PLACEMENT (IOC) LEFT DIABETIC 7.63, 00:43.3 (Left)  Patient location during evaluation: PACU Anesthesia Type: MAC Level of consciousness: awake and alert Pain management: pain level controlled Vital Signs Assessment: post-procedure vital signs reviewed and stable Respiratory status: spontaneous breathing, nonlabored ventilation, respiratory function stable and patient connected to nasal cannula oxygen Cardiovascular status: stable and blood pressure returned to baseline Postop Assessment: no apparent nausea or vomiting Anesthetic complications: no   No notable events documented.   Last Vitals:  Vitals:   01/25/24 0832 01/25/24 0837  BP: (!) 117/58 122/62  Pulse: 71 70  Resp: 17 20  Temp: (!) 36 C 36.4 C  SpO2: 100% 97%    Last Pain:  Vitals:   01/25/24 0837  TempSrc:   PainSc: 0-No pain                 Marisue Humble

## 2024-01-25 NOTE — Op Note (Signed)
 OPERATIVE NOTE  Augie Vane [id number] 01/25/2024   PREOPERATIVE DIAGNOSIS:  Nuclear sclerotic cataract left eye.  H25.12   POSTOPERATIVE DIAGNOSIS:      Nuclear sclerotic cataract left eye.   Mature cataract, left eye.   PROCEDURE:  CPT [id number] Complex Phacoemusification with posterior chamber intraocular lens placement of the left eye, requiring use of trypan blue for visualization of the anterior capsule.  LENS:   Implant Name Type Inv. Item Serial No. Manufacturer Lot No. LRB No. Used Action  LENS IOL TECNIS EYHANCE 16.5 - [id number] Intraocular Lens LENS IOL TECNIS EYHANCE 16.5 [id number] [employer]  Left 1 Implanted      Procedure(s): CATARACT EXTRACTION PHACO AND INTRAOCULAR LENS PLACEMENT (IOC) LEFT DIABETIC 7.63, 00:43.3 (Left)  SURGEON:  Willey Blade, MD, MPH   ANESTHESIA:  Topical with tetracaine drops augmented with 1% preservative-free intracameral lidocaine.  ESTIMATED BLOOD LOSS: <1 mL   COMPLICATIONS:  None.   DESCRIPTION OF PROCEDURE:  The patient was identified in the holding room and transported to the operating room and placed in the supine position under the operating microscope.  The left eye was identified as the operative eye and it was prepped and draped in the usual sterile ophthalmic fashion.   A 1.0 millimeter clear-corneal paracentesis was made at the 5:00 position. 0.5 ml of preservative-free 1% lidocaine with epinephrine was injected into the anterior chamber.  The anterior chamber was filled with viscoelastic.  A 2.4 millimeter keratome was used to make a near-clear corneal incision at the 2:00 position.  A curvilinear capsulorrhexis was initiated and the capsule was difficulty to visualize due to the dense mature cataract.  There was a poor red reflex, so Trypan blue was instilled under an and rinsed out with BSS to stain the capsule for improved visualization. Additional viscoat was placed in the anterior chamber.  The capsulorrhexis forceps were  used to complete the rhexis.  Balanced salt solution was used to hydrodissect and hydrodelineate the nucleus.   Phacoemulsification was then used in stop and chop fashion to remove the lens nucleus and epinucleus.  The remaining cortex was then removed using the irrigation and aspiration handpiece. Viscoelastic was then placed into the capsular bag to distend it for lens placement.  A lens was then injected into the capsular bag.  The remaining viscoelastic was aspirated.   Wounds were hydrated with balanced salt solution.  The anterior chamber was inflated to a physiologic pressure with balanced salt solution.  Intracameral vigamox 0.1 mL undiltued was injected into the eye and a drop placed onto the ocular surface.  No wound leaks were noted.  The patient was taken to the recovery room in stable condition without complications of anesthesia or surgery  Willey Blade 01/25/2024, 8:29 AM

## 2024-01-26 ENCOUNTER — Encounter: Payer: Self-pay | Admitting: Ophthalmology

## 2024-12-13 ENCOUNTER — Other Ambulatory Visit: Payer: Self-pay

## 2024-12-13 ENCOUNTER — Emergency Department

## 2024-12-13 ENCOUNTER — Inpatient Hospital Stay

## 2024-12-13 ENCOUNTER — Inpatient Hospital Stay
Admission: EM | Admit: 2024-12-13 | Discharge: 2024-12-16 | DRG: 871 | Disposition: A | Discharge status: Hospice | Attending: Internal Medicine | Admitting: Internal Medicine

## 2024-12-13 DIAGNOSIS — E875 Hyperkalemia: Secondary | ICD-10-CM | POA: Diagnosis present

## 2024-12-13 DIAGNOSIS — Z1152 Encounter for screening for COVID-19: Secondary | ICD-10-CM | POA: Diagnosis not present

## 2024-12-13 DIAGNOSIS — G9341 Metabolic encephalopathy: Secondary | ICD-10-CM | POA: Diagnosis present

## 2024-12-13 DIAGNOSIS — N390 Urinary tract infection, site not specified: Secondary | ICD-10-CM | POA: Diagnosis present

## 2024-12-13 DIAGNOSIS — G309 Alzheimer's disease, unspecified: Secondary | ICD-10-CM | POA: Diagnosis present

## 2024-12-13 DIAGNOSIS — A4159 Other Gram-negative sepsis: Secondary | ICD-10-CM | POA: Diagnosis present

## 2024-12-13 DIAGNOSIS — E874 Mixed disorder of acid-base balance: Secondary | ICD-10-CM | POA: Diagnosis present

## 2024-12-13 DIAGNOSIS — I4891 Unspecified atrial fibrillation: Secondary | ICD-10-CM | POA: Insufficient documentation

## 2024-12-13 DIAGNOSIS — N1831 Chronic kidney disease, stage 3a: Secondary | ICD-10-CM | POA: Diagnosis present

## 2024-12-13 DIAGNOSIS — L89322 Pressure ulcer of left buttock, stage 2: Secondary | ICD-10-CM | POA: Diagnosis present

## 2024-12-13 DIAGNOSIS — E1122 Type 2 diabetes mellitus with diabetic chronic kidney disease: Secondary | ICD-10-CM | POA: Diagnosis present

## 2024-12-13 DIAGNOSIS — D631 Anemia in chronic kidney disease: Secondary | ICD-10-CM | POA: Diagnosis present

## 2024-12-13 DIAGNOSIS — E1159 Type 2 diabetes mellitus with other circulatory complications: Secondary | ICD-10-CM | POA: Diagnosis present

## 2024-12-13 DIAGNOSIS — Z7901 Long term (current) use of anticoagulants: Secondary | ICD-10-CM

## 2024-12-13 DIAGNOSIS — Z951 Presence of aortocoronary bypass graft: Secondary | ICD-10-CM

## 2024-12-13 DIAGNOSIS — Z7984 Long term (current) use of oral hypoglycemic drugs: Secondary | ICD-10-CM

## 2024-12-13 DIAGNOSIS — Z955 Presence of coronary angioplasty implant and graft: Secondary | ICD-10-CM

## 2024-12-13 DIAGNOSIS — Z6830 Body mass index (BMI) 30.0-30.9, adult: Secondary | ICD-10-CM | POA: Diagnosis not present

## 2024-12-13 DIAGNOSIS — I5021 Acute systolic (congestive) heart failure: Secondary | ICD-10-CM | POA: Diagnosis present

## 2024-12-13 DIAGNOSIS — Z7982 Long term (current) use of aspirin: Secondary | ICD-10-CM

## 2024-12-13 DIAGNOSIS — Z87891 Personal history of nicotine dependence: Secondary | ICD-10-CM

## 2024-12-13 DIAGNOSIS — I480 Paroxysmal atrial fibrillation: Secondary | ICD-10-CM | POA: Diagnosis present

## 2024-12-13 DIAGNOSIS — I152 Hypertension secondary to endocrine disorders: Secondary | ICD-10-CM | POA: Diagnosis present

## 2024-12-13 DIAGNOSIS — N179 Acute kidney failure, unspecified: Secondary | ICD-10-CM | POA: Diagnosis present

## 2024-12-13 DIAGNOSIS — I2489 Other forms of acute ischemic heart disease: Secondary | ICD-10-CM | POA: Diagnosis present

## 2024-12-13 DIAGNOSIS — A419 Sepsis, unspecified organism: Secondary | ICD-10-CM | POA: Diagnosis present

## 2024-12-13 DIAGNOSIS — Z794 Long term (current) use of insulin: Secondary | ICD-10-CM

## 2024-12-13 DIAGNOSIS — F0280 Dementia in other diseases classified elsewhere without behavioral disturbance: Secondary | ICD-10-CM | POA: Diagnosis present

## 2024-12-13 DIAGNOSIS — Z66 Do not resuscitate: Secondary | ICD-10-CM | POA: Diagnosis present

## 2024-12-13 DIAGNOSIS — I255 Ischemic cardiomyopathy: Secondary | ICD-10-CM | POA: Diagnosis present

## 2024-12-13 DIAGNOSIS — L8990 Pressure ulcer of unspecified site, unspecified stage: Secondary | ICD-10-CM | POA: Insufficient documentation

## 2024-12-13 DIAGNOSIS — E1169 Type 2 diabetes mellitus with other specified complication: Secondary | ICD-10-CM | POA: Diagnosis present

## 2024-12-13 DIAGNOSIS — J9601 Acute respiratory failure with hypoxia: Secondary | ICD-10-CM | POA: Diagnosis not present

## 2024-12-13 DIAGNOSIS — E669 Obesity, unspecified: Secondary | ICD-10-CM | POA: Diagnosis present

## 2024-12-13 DIAGNOSIS — R627 Adult failure to thrive: Secondary | ICD-10-CM | POA: Diagnosis present

## 2024-12-13 DIAGNOSIS — F0150 Vascular dementia without behavioral disturbance: Secondary | ICD-10-CM | POA: Diagnosis present

## 2024-12-13 DIAGNOSIS — Z888 Allergy status to other drugs, medicaments and biological substances status: Secondary | ICD-10-CM

## 2024-12-13 DIAGNOSIS — N320 Bladder-neck obstruction: Secondary | ICD-10-CM | POA: Diagnosis present

## 2024-12-13 DIAGNOSIS — I872 Venous insufficiency (chronic) (peripheral): Secondary | ICD-10-CM | POA: Diagnosis present

## 2024-12-13 DIAGNOSIS — N323 Diverticulum of bladder: Secondary | ICD-10-CM | POA: Diagnosis present

## 2024-12-13 DIAGNOSIS — I5043 Acute on chronic combined systolic (congestive) and diastolic (congestive) heart failure: Secondary | ICD-10-CM | POA: Diagnosis present

## 2024-12-13 DIAGNOSIS — R6521 Severe sepsis with septic shock: Secondary | ICD-10-CM | POA: Diagnosis present

## 2024-12-13 DIAGNOSIS — E785 Hyperlipidemia, unspecified: Secondary | ICD-10-CM | POA: Diagnosis present

## 2024-12-13 DIAGNOSIS — I252 Old myocardial infarction: Secondary | ICD-10-CM

## 2024-12-13 DIAGNOSIS — J189 Pneumonia, unspecified organism: Secondary | ICD-10-CM | POA: Diagnosis present

## 2024-12-13 DIAGNOSIS — G4733 Obstructive sleep apnea (adult) (pediatric): Secondary | ICD-10-CM | POA: Diagnosis present

## 2024-12-13 DIAGNOSIS — I2510 Atherosclerotic heart disease of native coronary artery without angina pectoris: Secondary | ICD-10-CM | POA: Diagnosis present

## 2024-12-13 DIAGNOSIS — Z79899 Other long term (current) drug therapy: Secondary | ICD-10-CM

## 2024-12-13 DIAGNOSIS — F0390 Unspecified dementia without behavioral disturbance: Secondary | ICD-10-CM | POA: Diagnosis present

## 2024-12-13 DIAGNOSIS — Z515 Encounter for palliative care: Secondary | ICD-10-CM | POA: Diagnosis not present

## 2024-12-13 DIAGNOSIS — M109 Gout, unspecified: Secondary | ICD-10-CM | POA: Diagnosis present

## 2024-12-13 DIAGNOSIS — N400 Enlarged prostate without lower urinary tract symptoms: Secondary | ICD-10-CM | POA: Diagnosis present

## 2024-12-13 DIAGNOSIS — I214 Non-ST elevation (NSTEMI) myocardial infarction: Secondary | ICD-10-CM

## 2024-12-13 DIAGNOSIS — I5082 Biventricular heart failure: Secondary | ICD-10-CM | POA: Diagnosis present

## 2024-12-13 DIAGNOSIS — Z8673 Personal history of transient ischemic attack (TIA), and cerebral infarction without residual deficits: Secondary | ICD-10-CM

## 2024-12-13 LAB — RESP PANEL BY RT-PCR (RSV, FLU A&B, COVID)  RVPGX2
Influenza A by PCR: NEGATIVE
Influenza B by PCR: NEGATIVE
Resp Syncytial Virus by PCR: NEGATIVE
SARS Coronavirus 2 by RT PCR: NEGATIVE

## 2024-12-13 LAB — URINALYSIS, ROUTINE W REFLEX MICROSCOPIC
Bilirubin Urine: NEGATIVE
Glucose, UA: 50 mg/dL — AB
Ketones, ur: NEGATIVE mg/dL
Nitrite: NEGATIVE
Protein, ur: 30 mg/dL — AB
Specific Gravity, Urine: 1.015 (ref 1.005–1.030)
WBC, UA: >50 WBC/hpf (ref 0–5)
pH: 5 (ref 5.0–8.0)

## 2024-12-13 LAB — CBC WITH DIFFERENTIAL/PLATELET
Abs Immature Granulocytes: 0.1 10*3/uL — ABNORMAL HIGH (ref 0.00–0.07)
Basophils Absolute: 0 10*3/uL (ref 0.0–0.1)
Basophils Relative: 0 %
Eosinophils Absolute: 0 10*3/uL (ref 0.0–0.5)
Eosinophils Relative: 0 %
HCT: 37.3 % — ABNORMAL LOW (ref 39.0–52.0)
Hemoglobin: 11.4 g/dL — ABNORMAL LOW (ref 13.0–17.0)
Immature Granulocytes: 1 %
Lymphocytes Relative: 9 %
Lymphs Abs: 1.3 10*3/uL (ref 0.7–4.0)
MCH: 26.3 pg (ref 26.0–34.0)
MCHC: 30.6 g/dL (ref 30.0–36.0)
MCV: 86.1 fL (ref 80.0–100.0)
Monocytes Absolute: 0.9 10*3/uL (ref 0.1–1.0)
Monocytes Relative: 6 %
Neutro Abs: 12.4 10*3/uL — ABNORMAL HIGH (ref 1.7–7.7)
Neutrophils Relative %: 84 %
Platelets: 329 10*3/uL (ref 150–400)
RBC: 4.33 MIL/uL (ref 4.22–5.81)
RDW: 15.5 % (ref 11.5–15.5)
WBC: 14.6 10*3/uL — ABNORMAL HIGH (ref 4.0–10.5)
nRBC: 0 % (ref 0.0–0.2)

## 2024-12-13 LAB — BASIC METABOLIC PANEL WITH GFR
Anion gap: 19 — ABNORMAL HIGH (ref 5–15)
BUN: 90 mg/dL — ABNORMAL HIGH (ref 8–23)
CO2: 16 mmol/L — ABNORMAL LOW (ref 22–32)
Calcium: 8.4 mg/dL — ABNORMAL LOW (ref 8.9–10.3)
Chloride: 101 mmol/L (ref 98–111)
Creatinine, Ser: 4.22 mg/dL — ABNORMAL HIGH (ref 0.61–1.24)
GFR, Estimated: 14 mL/min — ABNORMAL LOW
Glucose, Bld: 154 mg/dL — ABNORMAL HIGH (ref 70–99)
Potassium: 4.7 mmol/L (ref 3.5–5.1)
Sodium: 136 mmol/L (ref 135–145)

## 2024-12-13 LAB — COMPREHENSIVE METABOLIC PANEL WITH GFR
ALT: 22 U/L (ref 0–44)
AST: 51 U/L — ABNORMAL HIGH (ref 15–41)
Albumin: 3.8 g/dL (ref 3.5–5.0)
Alkaline Phosphatase: 120 U/L (ref 38–126)
Anion gap: 26 — ABNORMAL HIGH (ref 5–15)
BUN: 93 mg/dL — ABNORMAL HIGH (ref 8–23)
CO2: 13 mmol/L — ABNORMAL LOW (ref 22–32)
Calcium: 9.3 mg/dL (ref 8.9–10.3)
Chloride: 96 mmol/L — ABNORMAL LOW (ref 98–111)
Creatinine, Ser: 4.6 mg/dL — ABNORMAL HIGH (ref 0.61–1.24)
GFR, Estimated: 12 mL/min — ABNORMAL LOW
Glucose, Bld: 322 mg/dL — ABNORMAL HIGH (ref 70–99)
Potassium: 5.8 mmol/L — ABNORMAL HIGH (ref 3.5–5.1)
Sodium: 134 mmol/L — ABNORMAL LOW (ref 135–145)
Total Bilirubin: 0.9 mg/dL (ref 0.0–1.2)
Total Protein: 6.6 g/dL (ref 6.5–8.1)

## 2024-12-13 LAB — HEMOGLOBIN A1C
Hgb A1c MFr Bld: 9 % — ABNORMAL HIGH (ref 4.8–5.6)
Mean Plasma Glucose: 211.6 mg/dL

## 2024-12-13 LAB — PROTIME-INR
INR: 1.8 — ABNORMAL HIGH (ref 0.8–1.2)
Prothrombin Time: 21.8 s — ABNORMAL HIGH (ref 11.4–15.2)

## 2024-12-13 LAB — APTT
aPTT: 32 s (ref 24–36)
aPTT: 87 s — ABNORMAL HIGH (ref 24–36)

## 2024-12-13 LAB — GLUCOSE, CAPILLARY
Glucose-Capillary: 172 mg/dL — ABNORMAL HIGH (ref 70–99)
Glucose-Capillary: 139 mg/dL — ABNORMAL HIGH (ref 70–99)

## 2024-12-13 LAB — CK: Total CK: 1534 U/L — ABNORMAL HIGH (ref 49–397)

## 2024-12-13 LAB — TROPONIN T, HIGH SENSITIVITY
Troponin T High Sensitivity: 693 ng/L (ref 0–19)
Troponin T High Sensitivity: 668 ng/L (ref 0–19)

## 2024-12-13 LAB — CREATININE, URINE, RANDOM: Creatinine, Urine: 182 mg/dL

## 2024-12-13 LAB — BLOOD GAS, VENOUS
Acid-base deficit: 9.6 mmol/L — ABNORMAL HIGH (ref 0.0–2.0)
Bicarbonate: 15.7 mmol/L — ABNORMAL LOW (ref 20.0–28.0)
O2 Saturation: 70.3 %
Patient temperature: 37
pCO2, Ven: 32 mmHg — ABNORMAL LOW (ref 44–60)
pH, Ven: 7.3 (ref 7.25–7.43)
pO2, Ven: 47 mmHg — ABNORMAL HIGH (ref 32–45)

## 2024-12-13 LAB — PHOSPHORUS: Phosphorus: 7.5 mg/dL — ABNORMAL HIGH (ref 2.5–4.6)

## 2024-12-13 LAB — MAGNESIUM: Magnesium: 2.3 mg/dL (ref 1.7–2.4)

## 2024-12-13 LAB — PRO BRAIN NATRIURETIC PEPTIDE: Pro Brain Natriuretic Peptide: 24052 pg/mL — ABNORMAL HIGH

## 2024-12-13 LAB — SODIUM, URINE, RANDOM: Sodium, Ur: <30 mmol/L

## 2024-12-13 LAB — CBG MONITORING, ED: Glucose-Capillary: 181 mg/dL — ABNORMAL HIGH (ref 70–99)

## 2024-12-13 LAB — LACTIC ACID, PLASMA
Lactic Acid, Venous: 7.2 mmol/L (ref 0.5–1.9)
Lactic Acid, Venous: 3.7 mmol/L (ref 0.5–1.9)

## 2024-12-13 LAB — HEPARIN LEVEL (UNFRACTIONATED): Heparin Unfractionated: >1.1 [IU]/mL — ABNORMAL HIGH (ref 0.30–0.70)

## 2024-12-13 MED ORDER — INSULIN ASPART 100 UNIT/ML IJ SOLN
5.0000 [IU] | Freq: Once | INTRAMUSCULAR | Status: AC
  Administered 2024-12-13: 5 [IU] via INTRAVENOUS
  Filled 2024-12-13: qty 5

## 2024-12-13 MED ORDER — HEPARIN BOLUS VIA INFUSION
4000.0000 [IU] | Freq: Once | INTRAVENOUS | Status: AC
  Administered 2024-12-13: 4000 [IU] via INTRAVENOUS
  Filled 2024-12-13: qty 4000

## 2024-12-13 MED ORDER — INSULIN ASPART 100 UNIT/ML IJ SOLN
5.0000 [IU] | Freq: Once | INTRAMUSCULAR | Status: AC
  Administered 2024-12-13: 5 [IU] via SUBCUTANEOUS
  Filled 2024-12-13: qty 5

## 2024-12-13 MED ORDER — HEPARIN SODIUM (PORCINE) 5000 UNIT/ML IJ SOLN: 4000.0000 [IU] | Freq: Once | INTRAMUSCULAR | Status: DC

## 2024-12-13 MED ORDER — SODIUM ZIRCONIUM CYCLOSILICATE 10 G PO PACK
10.0000 g | PACK | Freq: Once | ORAL | Status: AC
  Administered 2024-12-13: 10 g via ORAL
  Filled 2024-12-13: qty 1

## 2024-12-13 MED ORDER — FUROSEMIDE 10 MG/ML IJ SOLN
60.0000 mg | Freq: Once | INTRAMUSCULAR | Status: AC
  Administered 2024-12-13: 60 mg via INTRAVENOUS
  Filled 2024-12-13: qty 8

## 2024-12-13 MED ORDER — SODIUM CHLORIDE 0.9 % IV BOLUS
1000.0000 mL | Freq: Once | INTRAVENOUS | Status: AC
  Administered 2024-12-13: 1000 mL via INTRAVENOUS

## 2024-12-13 MED ORDER — BUPROPION HCL ER (XL) 150 MG PO TB24
150.0000 mg | ORAL_TABLET | Freq: Every day | ORAL | Status: DC
  Administered 2024-12-13 – 2024-12-16 (×4): 150 mg via ORAL
  Filled 2024-12-13 (×4): qty 1

## 2024-12-13 MED ORDER — ACETAMINOPHEN 650 MG RE SUPP: 650.0000 mg | Freq: Four times a day (QID) | RECTAL | Status: DC | PRN

## 2024-12-13 MED ORDER — SODIUM CHLORIDE 0.9 % IV SOLN
2.0000 g | INTRAVENOUS | Status: DC
  Administered 2024-12-14: 2 g via INTRAVENOUS
  Filled 2024-12-13 (×2): qty 20

## 2024-12-13 MED ORDER — SODIUM CHLORIDE 0.9 % IV SOLN
100.0000 mg | Freq: Once | INTRAVENOUS | Status: DC
  Administered 2024-12-13: 100 mg via INTRAVENOUS
  Filled 2024-12-13: qty 100

## 2024-12-13 MED ORDER — ASPIRIN 81 MG PO CHEW
81.0000 mg | CHEWABLE_TABLET | Freq: Every day | ORAL | Status: DC
  Administered 2024-12-13 – 2024-12-15 (×3): 81 mg via ORAL
  Filled 2024-12-13 (×3): qty 1

## 2024-12-13 MED ORDER — MEMANTINE HCL 5 MG PO TABS: 10.0000 mg | ORAL_TABLET | Freq: Two times a day (BID) | ORAL | Status: DC

## 2024-12-13 MED ORDER — SODIUM CHLORIDE 0.9 % IV SOLN
2.0000 g | Freq: Once | INTRAVENOUS | Status: AC
  Administered 2024-12-13: 2 g via INTRAVENOUS
  Filled 2024-12-13: qty 20

## 2024-12-13 MED ORDER — SODIUM CHLORIDE 0.9 % IV SOLN
100.0000 mg | Freq: Two times a day (BID) | INTRAVENOUS | Status: DC
  Filled 2024-12-13: qty 100

## 2024-12-13 MED ORDER — SERTRALINE HCL 50 MG PO TABS: 25.0000 mg | ORAL_TABLET | Freq: Every day | ORAL | Status: DC

## 2024-12-13 MED ORDER — ONDANSETRON HCL 4 MG PO TABS: 4.0000 mg | ORAL_TABLET | Freq: Four times a day (QID) | ORAL | Status: DC | PRN

## 2024-12-13 MED ORDER — INSULIN ASPART 100 UNIT/ML IJ SOLN
0.0000 [IU] | Freq: Three times a day (TID) | INTRAMUSCULAR | Status: DC
  Administered 2024-12-13 (×2): 2 [IU] via SUBCUTANEOUS
  Administered 2024-12-14: 3 [IU] via SUBCUTANEOUS
  Administered 2024-12-14: 5 [IU] via SUBCUTANEOUS
  Administered 2024-12-14 – 2024-12-15 (×2): 3 [IU] via SUBCUTANEOUS
  Administered 2024-12-15: 2 [IU] via SUBCUTANEOUS
  Administered 2024-12-15: 3 [IU] via SUBCUTANEOUS
  Administered 2024-12-16: 2 [IU] via SUBCUTANEOUS
  Filled 2024-12-13: qty 2
  Filled 2024-12-13: qty 5
  Filled 2024-12-13 (×2): qty 3
  Filled 2024-12-13: qty 2
  Filled 2024-12-13 (×2): qty 3
  Filled 2024-12-13: qty 2
  Filled 2024-12-13: qty 3

## 2024-12-13 MED ORDER — ACETAMINOPHEN 325 MG PO TABS: 650.0000 mg | ORAL_TABLET | Freq: Four times a day (QID) | ORAL | Status: DC | PRN

## 2024-12-13 MED ORDER — METOPROLOL TARTRATE 5 MG/5ML IV SOLN
5.0000 mg | INTRAVENOUS | Status: DC | PRN
  Administered 2024-12-13 – 2024-12-15 (×5): 5 mg via INTRAVENOUS
  Filled 2024-12-13 (×6): qty 5

## 2024-12-13 MED ORDER — AMIODARONE LOAD VIA INFUSION
150.0000 mg | Freq: Once | INTRAVENOUS | Status: AC
  Administered 2024-12-13: 150 mg via INTRAVENOUS
  Filled 2024-12-13: qty 83.34

## 2024-12-13 MED ORDER — HEPARIN (PORCINE) 25000 UT/250ML-% IV SOLN
1200.0000 [IU]/h | INTRAVENOUS | Status: DC
  Administered 2024-12-13 – 2024-12-15 (×3): 1200 [IU]/h via INTRAVENOUS
  Filled 2024-12-13 (×3): qty 250

## 2024-12-13 MED ORDER — AMIODARONE HCL IN DEXTROSE 360-4.14 MG/200ML-% IV SOLN
30.0000 mg/h | INTRAVENOUS | Status: DC
  Administered 2024-12-15 (×2): 30 mg/h via INTRAVENOUS
  Filled 2024-12-13 (×4): qty 200

## 2024-12-13 MED ORDER — TAMSULOSIN HCL 0.4 MG PO CAPS
0.4000 mg | ORAL_CAPSULE | Freq: Every day | ORAL | Status: DC
  Administered 2024-12-13 – 2024-12-15 (×3): 0.4 mg via ORAL
  Filled 2024-12-13 (×3): qty 1

## 2024-12-13 MED ORDER — AMIODARONE HCL IN DEXTROSE 360-4.14 MG/200ML-% IV SOLN
60.0000 mg/h | INTRAVENOUS | Status: AC
  Administered 2024-12-13 – 2024-12-14 (×2): 60 mg/h via INTRAVENOUS
  Filled 2024-12-13 (×2): qty 200

## 2024-12-13 MED ORDER — CHLORHEXIDINE GLUCONATE CLOTH 2 % EX PADS
6.0000 | MEDICATED_PAD | Freq: Every day | CUTANEOUS | Status: DC
  Administered 2024-12-13 – 2024-12-16 (×4): 6 via TOPICAL
  Filled 2024-12-13: qty 6

## 2024-12-13 MED ORDER — ALLOPURINOL 100 MG PO TABS
100.0000 mg | ORAL_TABLET | Freq: Every day | ORAL | Status: DC
  Administered 2024-12-14 – 2024-12-15 (×2): 100 mg via ORAL
  Filled 2024-12-13 (×2): qty 1

## 2024-12-13 MED ORDER — DONEPEZIL HCL 5 MG PO TABS: 10.0000 mg | ORAL_TABLET | Freq: Every day | ORAL | Status: DC

## 2024-12-13 MED ORDER — ONDANSETRON HCL 4 MG/2ML IJ SOLN: 4.0000 mg | Freq: Four times a day (QID) | INTRAMUSCULAR | Status: DC | PRN

## 2024-12-13 MED ORDER — METOPROLOL TARTRATE 25 MG PO TABS
12.5000 mg | ORAL_TABLET | Freq: Once | ORAL | Status: AC
  Administered 2024-12-13: 12.5 mg via ORAL
  Filled 2024-12-13: qty 1

## 2024-12-13 MED ORDER — SERTRALINE HCL 50 MG PO TABS: 50.0000 mg | ORAL_TABLET | Freq: Every day | ORAL | Status: DC

## 2024-12-13 MED ORDER — SODIUM CHLORIDE 0.45 % IV SOLN
INTRAVENOUS | Status: AC
  Filled 2024-12-13: qty 75

## 2024-12-13 MED ORDER — TRAZODONE HCL 50 MG PO TABS: 25.0000 mg | ORAL_TABLET | Freq: Every evening | ORAL | Status: DC | PRN

## 2024-12-13 MED ORDER — ATORVASTATIN CALCIUM 80 MG PO TABS
80.0000 mg | ORAL_TABLET | Freq: Every day | ORAL | Status: DC
  Administered 2024-12-13 – 2024-12-14 (×2): 80 mg via ORAL
  Filled 2024-12-13: qty 4
  Filled 2024-12-13: qty 1

## 2024-12-13 MED ORDER — HYDROMORPHONE HCL 1 MG/ML IJ SOLN: 0.5000 mg | INTRAMUSCULAR | Status: DC | PRN

## 2024-12-13 NOTE — Progress Notes (Signed)
 MD made aware of respiration rate. New orders for chest x-ray.  HR continues to increase into the 140s. PRN iv metop given. MD aware.

## 2024-12-13 NOTE — Progress Notes (Signed)
 "                                                                                   Consultation Note Date: 12/13/2024   Patient Name: James [name]  DOB: 1946/03/18  MRN: [id number]  Age / Sex: 79 y.o., male  PCP: Lenon Layman ORN, MD Referring Physician: Laurita Cort DASEN, MD  Reason for Consultation: Establishing goals of care  HPI/Patient Profile: Keijuan Schellhase is a 79 y.o. male with medical history significant of dementia, CAD STEMI status post PCI and stenting 2016, ischemic cardiomyopathy, chronic HFrEF with LVEF less than 30%, PAF on Eliquis ,  IIDM, brought in by family member for evaluation of worsening of confusion, poor oral intake.   Clinical Assessment and Goals of Care: Notes and labs reviewed.  In to see patient.  He is currently resting in bed at this time with his wife at bedside.  Wife advises she is a agricultural consultant for [employer].  Patient does not engage and answer questions.  She discusses that at baseline patient enjoys watching westerns, but does not talk as much as he used to.  She states he does not initiate conversation, but many times he will repeat a comment.  She states he has been using depends for about the past year but will still try to go to the bathroom.  She states she helps him with hygiene.  She states sometimes he sits in the chair on the toilet for 30 minutes to an hour and appears lost.  She states his appetite has been decreasing and he has been sleeping more.  We discussed his diagnoses, prognosis, GOC, EOL wishes disposition and options.  A detailed discussion was had today regarding advanced directives.  Concepts specific to code status, artifical feeding and hydration, IV antibiotics and rehospitalization were discussed.  The difference between an aggressive medical intervention path and a comfort care path was discussed.  Values and goals of care important to patient and family were attempted to be elicited.  Discussed limitations of medical  interventions to prolong quality of life in some situations and discussed the concept of human mortality.  She has been updated and is able to articulate his status.  She states she is aware that her husband may be ready for hospice level care, but would like a couple of days to see how he does.  She confirms DNR/DNI.   SUMMARY OF RECOMMENDATIONS   PMT will follow.  Time for outcomes.       Primary Diagnoses: Present on Admission: **None**   I have reviewed the medical record, interviewed the patient and family, and examined the patient. The following aspects are pertinent.  Past Medical History:  Diagnosis Date   Asteatotic eczema    BPH (benign prostatic hyperplasia)    CAD (coronary artery disease)    Chronic cough    Chronic cough    Chronic diastolic CHF (congestive heart failure) (HCC)    Ejection fraction < 50%    Erectile dysfunction    Erectile dysfunction    Gait disorder    Gout    Grade I diastolic dysfunction    Heart attack (HCC) 2016   History  of heart artery stent    History of ST elevation myocardial infarction (STEMI)    Hypertension associated with type 2 diabetes mellitus (HCC)    Hypogonadism in male    Ischemic cardiomyopathy    Low left ventricular ejection fraction    Mixed Alzheimer's and vascular dementia (HCC)    OSA on CPAP    Paroxysmal atrial fibrillation (HCC)    Sleep apnea    CPAP   Stasis dermatitis of both legs    Stroke (HCC) 2009   Peripheral vision impairment   Type 2 diabetes mellitus (HCC)    Type 2 diabetes mellitus with other specified complication (HCC)    Social History   Socioeconomic History   Marital status: Married    Spouse name: Not on file   Number of children: Not on file   Years of education: Not on file   Highest education level: Not on file  Occupational History   Not on file  Tobacco Use   Smoking status: Former    Current packs/day: 0.00    Average packs/day: 1.5 packs/day for 25.0 years (37.5  ttl pk-yrs)    Types: Cigarettes    Start date: 11/18/1959    Quit date: 11/17/1984    Years since quitting: 40.0   Smokeless tobacco: Never   Tobacco comments:    years ago  Vaping Use   Vaping status: Never Used  Substance and Sexual Activity   Alcohol use: No    Alcohol/week: 0.0 standard drinks of alcohol   Drug use: No   Sexual activity: Not on file  Other Topics Concern   Not on file  Social History Narrative   Not on file   Social Drivers of Health   Tobacco Use: Medium Risk (10/31/2024)   Received from [facility]   Patient History    Smoking Tobacco Use: Former    Smokeless Tobacco Use: Never    Passive Exposure: Not on file  Financial Resource Strain: Low Risk  (09/07/2024)   Received from [facility]   Overall Financial Resource Strain (CARDIA)    Difficulty of Paying Living Expenses: Not hard at all  Food Insecurity: No Food Insecurity (09/07/2024)   Received from [facility]   Epic    Within the past 12 months, you worried that your food would run out before you got the money to buy more.: Never true    Within the past 12 months, the food you bought just didn't last and you didn't have money to get more.: Never true  Transportation Needs: No Transportation Needs (09/07/2024)   Received from [facility] - Transportation    In the past 12 months, has lack of transportation kept you from medical appointments or from getting medications?: No    Lack of Transportation (Non-Medical): No  Physical Activity: Not on file  Stress: Not on file  Social Connections: Not on file  Depression (EYV7-0): Not on file  Alcohol Screen: Not on file  Housing: Low Risk  (09/07/2024)   Received from [facility]   Epic    In the last 12 months, was there a time when you were not able to pay the mortgage or rent on time?: No    In the past 12 months, how many times have you moved  where you were living?: 0    At any time in the past 12 months, were you homeless or living in  a shelter (including now)?: No  Utilities: Not At Risk (09/07/2024)   Received from [facility]   Epic    In the past 12 months has the electric, gas, oil, or water company threatened to shut off services in your home?: No  Health Literacy: Not on file   Family History  Problem Relation Age of Onset   Kidney disease Neg Hx    Prostate cancer Neg Hx    Bladder Cancer Neg Hx    Kidney cancer Neg Hx    Scheduled Meds:  allopurinol   100 mg Oral Daily   aspirin   81 mg Oral Daily   atorvastatin   80 mg Oral Daily   buPROPion   150 mg Oral Daily   Chlorhexidine  Gluconate Cloth  6 each Topical Daily   insulin  aspart  0-9 Units Subcutaneous TID WC   tamsulosin   0.4 mg Oral QPC supper   Continuous Infusions:  [START ON 12/14/2024] cefTRIAXone  (ROCEPHIN )  IV     heparin  1,200 Units/hr (12/13/24 1203)   sodium bicarbonate  75 mEq in sodium chloride  0.45 % 1,075 mL infusion     PRN Meds:.acetaminophen  **OR** acetaminophen , HYDROmorphone  (DILAUDID ) injection, metoprolol  tartrate, ondansetron  **OR** ondansetron  (ZOFRAN ) IV, traZODone  Medications Prior to Admission:  Prior to Admission medications  Medication Sig Start Date End Date Taking? Authorizing Provider  allopurinol  (ZYLOPRIM ) 100 MG tablet Take 1 tablet by mouth daily. 02/24/20  Yes [provider]  apixaban  (ELIQUIS ) 5 MG TABS tablet Take 5 mg by mouth 2 (two) times daily.   Yes [provider]  aspirin  81 MG tablet Take 81 mg by mouth daily.   Yes [provider]  atorvastatin  (LIPITOR ) 80 MG tablet Take 1 tablet by mouth. 10/04/15  Yes [provider]  buPROPion  (WELLBUTRIN  XL) 150 MG 24 hr tablet Take 150 mg by mouth daily.   Yes [provider]  cyanocobalamin (VITAMIN B12) 1000 MCG tablet Take 1,000 mcg by mouth daily.   Yes [provider]  donepezil  (ARICEPT ) 10 MG  tablet TAKE 1 TABLET BY MOUTH EVERY DAY AT NIGHT 07/12/18  Yes [provider]  metFORMIN (GLUCOPHAGE-XR) 500 MG 24 hr tablet Take 1,500 mg by mouth daily. 06/01/23  Yes [provider]  metoprolol  succinate (TOPROL -XL) 25 MG 24 hr tablet Take 25 mg by mouth daily. 09/22/15  Yes [provider]  POTASSIUM CHLORIDE PO Take 10 mEq by mouth daily.   Yes [provider]  torsemide (DEMADEX) 20 MG tablet Take 20 mg by mouth daily.   Yes [provider]  Vitamin D, Cholecalciferol, 1000 UNITS CAPS Take by mouth daily.   Yes [provider]  colchicine 0.6 MG tablet Take 2 tablets (1.2mg ) by mouth at first sign of gout flare followed by 1 tablet (0.6mg ) after 1 hour. (Max 1.8mg  within 1 hour) Patient not taking: Reported on 01/14/2024 07/30/16   [provider]  memantine  (NAMENDA ) 10 MG tablet Take 10 mg by mouth 2 (two) times daily. Patient not taking: Reported on 12/13/2024 08/03/20   [provider]  neomycin-polymyxin b-dexamethasone (MAXITROL) 3.5-10000-0.1 OINT Place 1 Application into the right eye 4 (four) times daily. Patient not taking: Reported on 12/13/2024 07/22/23   [provider]  nitroGLYCERIN (NITROSTAT) 0.4 MG SL tablet Place under the tongue as needed. 06/11/20   [provider]  ofloxacin (OCUFLOX) 0.3 % ophthalmic solution Place 1 drop into the right eye 4 (four) times daily. Patient not taking: Reported on 12/13/2024 07/22/23  [provider]  prednisoLONE acetate (PRED FORTE) 1 % ophthalmic suspension Place 1 drop into the right eye 4 (four) times daily. Patient not taking: Reported on 12/13/2024 07/22/23   [provider]  sertraline  (ZOLOFT ) 50 MG tablet Take 1 tablet by mouth daily. Patient not taking: Reported on 12/13/2024    [provider]   Allergies[1] Review of Systems  All other systems reviewed and are negative.   Physical Exam Pulmonary:     Effort: Pulmonary  effort is normal.  Skin:    General: Skin is warm and dry.  Neurological:     Mental Status: He is alert.     Vital Signs: BP 101/62   Pulse 88   Temp 97.6 F (36.4 C) (Oral)   Resp (!) 36   Ht 5' 11 (1.803 m)   Wt 99.6 kg   SpO2 97%   BMI 30.62 kg/m  Pain Scale: 0-10   Pain Score: 0-No pain   SpO2: SpO2: 97 % O2 Device:SpO2: 97 % O2 Flow Rate: .   IO: Intake/output summary:  Intake/Output Summary (Last 24 hours) at 12/13/2024 1536 Last data filed at 12/13/2024 1355 Gross per 24 hour  Intake 2268.65 ml  Output --  Net 2268.65 ml    LBM:   Baseline Weight: Weight: 99.6 kg Most recent weight: Weight: 99.6 kg       Time In: 1:00 Time Out: 2:00 Time Total: 60 min Greater than 50%  of this time was spent counseling and coordinating care related to the above assessment and plan.  Signed by: Camelia Lewis, NP   Please contact Palliative Medicine Team phone at [phone] for questions and concerns.  For individual provider: See Amion                 [1]  Allergies Allergen Reactions   Ramipril     Other reaction(s):   Hiccups   Empagliflozin Other (See Comments)    Other reaction(s): dysuria   "

## 2024-12-13 NOTE — H&P (Signed)
 " History and Physical    Helmuth Recupero FMW:[id number] DOB: [birth year] DOA: 12/13/2024  PCP: Lenon Layman ORN, MD (Confirm with patient/family/NH records and if not entered, this has to be entered at [facility] point of entry) Patient coming from: Home  I have personally briefly reviewed patient's old medical records in [facility]  Chief Complaint: Confusion, not eating drinking  HPI: James [name] is a 79 y.o. male with medical history significant of dementia, CAD STEMI status post PCI and stenting 2016, ischemic cardiomyopathy, chronic HFrEF with LVEF less than 30%, PAF on Eliquis ,  IIDM, brought in by family member for evaluation of worsening of confusion, poor oral intake.  Patient is confused and unable to provide any history, with history of provided by wife at bedside.  Patient started to deteriorated about 10 days ago, when he has had significant decrease of oral intake including meals and fluid, wife however has been able to feed him with his pills.  Last 3 days, patient has become more confused, and on average sleeps 14-15 hours a day, when he was awake he mainly stayed in the bed.  Wife found patient has significant decrease of urine output.  There was no nausea vomiting or diarrhea.  And patient has not been complaining of any pain.  Increasedly UF has been having difficulty to help patient to eat or go to bathroom and eventually she decided to bring him to the hospital.  ED Course:  Afebrile, tachycardia EKG showed uncontrolled A-fib, blood pressure 112/79 O2 saturation 100% on room air.  Chest x-ray suspicious for left lower lobe pneumonia, blood work showed AKI creatinine 93 BUN 4.6 glucose 322 bicarb 13K 5.8.  Patient was given ceftriaxone  doxycycline , IV bolus of 2000 mL normal saline and 1 dose of IV Lasix  in the ED.   Review of Systems: Unable to perform, patient is confused.  Past Medical History:  Diagnosis Date   Asteatotic eczema    BPH (benign prostatic hyperplasia)     CAD (coronary artery disease)    Chronic cough    Chronic cough    Chronic diastolic CHF (congestive heart failure) (HCC)    Ejection fraction < 50%    Erectile dysfunction    Erectile dysfunction    Gait disorder    Gout    Grade I diastolic dysfunction    Heart attack (HCC) 2016   History of heart artery stent    History of ST elevation myocardial infarction (STEMI)    Hypertension associated with type 2 diabetes mellitus (HCC)    Hypogonadism in male    Ischemic cardiomyopathy    Low left ventricular ejection fraction    Mixed Alzheimer's and vascular dementia (HCC)    OSA on CPAP    Paroxysmal atrial fibrillation (HCC)    Sleep apnea    CPAP   Stasis dermatitis of both legs    Stroke (HCC) 2009   Peripheral vision impairment   Type 2 diabetes mellitus (HCC)    Type 2 diabetes mellitus with other specified complication [facility])     Past Surgical History:  Procedure Laterality Date   abdominal cyst surgery     CARDIAC CATHETERIZATION  2016   3 stents   CATARACT EXTRACTION W/PHACO Left 01/25/2024   Procedure: CATARACT EXTRACTION PHACO AND INTRAOCULAR LENS PLACEMENT (IOC) LEFT DIABETIC 7.63, 00:43.3;  Surgeon: Myrna Adine Anes, MD;  Location: [facility];  Service: Ophthalmology;  Laterality: Left;   RETINAL DETACHMENT SURGERY Right 07/22/2023   [facility]  reports that he quit smoking about 40 years ago. His smoking use included cigarettes. He started smoking about 65 years ago. He has a 37.5 pack-year smoking history. He has never used smokeless tobacco. He reports that he does not drink alcohol and does not use drugs.  Allergies[1]  Family History  Problem Relation Age of Onset   Kidney disease Neg Hx    Prostate cancer Neg Hx    Bladder Cancer Neg Hx    Kidney cancer Neg Hx      Prior to Admission medications  Medication Sig Start Date End Date Taking? Authorizing Provider  allopurinol  (ZYLOPRIM ) 100 MG tablet Take 1 tablet by mouth daily. 02/24/20    [provider]  apixaban  (ELIQUIS ) 5 MG TABS tablet Take 5 mg by mouth 2 (two) times daily.    [provider]  aspirin  81 MG tablet Take 81 mg by mouth daily.    [provider]  atorvastatin  (LIPITOR ) 80 MG tablet Take 1 tablet by mouth. 10/04/15   [provider]  colchicine 0.6 MG tablet Take 2 tablets (1.2mg ) by mouth at first sign of gout flare followed by 1 tablet (0.6mg ) after 1 hour. (Max 1.8mg  within 1 hour) Patient not taking: Reported on 01/14/2024 07/30/16   [provider]  cyanocobalamin (VITAMIN B12) 1000 MCG tablet Take 1,000 mcg by mouth daily.    [provider]  donepezil  (ARICEPT ) 10 MG tablet TAKE 1 TABLET BY MOUTH EVERY DAY AT NIGHT 07/12/18   [provider]  memantine  (NAMENDA ) 10 MG tablet Take 10 mg by mouth 2 (two) times daily. 08/03/20   [provider]  metFORMIN (GLUCOPHAGE-XR) 500 MG 24 hr tablet Take 500 mg by mouth daily. 06/01/23   [provider]  metoprolol  succinate (TOPROL -XL) 25 MG 24 hr tablet Take 25 mg by mouth daily. 09/22/15   [provider]  neomycin-polymyxin b-dexamethasone (MAXITROL) 3.5-10000-0.1 OINT Place 1 Application into the right eye 4 (four) times daily. 07/22/23   [provider]  nitroGLYCERIN (NITROSTAT) 0.4 MG SL tablet Place under the tongue as needed. 06/11/20   [provider]  ofloxacin (OCUFLOX) 0.3 % ophthalmic solution Place 1 drop into the right eye 4 (four) times daily. 07/22/23   [provider]  POTASSIUM CHLORIDE PO Take 10 mEq by mouth daily.    [provider]  prednisoLONE acetate (PRED FORTE) 1 % ophthalmic suspension Place 1 drop into the right eye 4 (four) times daily. 07/22/23   [provider]  sertraline  (ZOLOFT ) 50 MG tablet Take 1 tablet by mouth daily.    [provider]  torsemide (DEMADEX) 20 MG tablet Take 20 mg by mouth daily.    [provider]  Vitamin D,  Cholecalciferol, 1000 UNITS CAPS Take by mouth daily.    [provider]    Physical Exam: Vitals:   12/13/24 0930 12/13/24 0945 12/13/24 1045 12/13/24 1100  BP: 109/85  112/79   Pulse: (!) 50     Resp: 19  (!) 26   Temp:      TempSrc:      SpO2:  99%  100%  Weight:      Height:        Constitutional: NAD, calm, comfortable Vitals:   12/13/24 0930 12/13/24 0945 12/13/24 1045 12/13/24 1100  BP: 109/85  112/79   Pulse: (!) 50     Resp: 19  (!) 26   Temp:      TempSrc:  SpO2:  99%  100%  Weight:      Height:       Eyes: PERRL, lids and conjunctivae normal ENMT: Mucous membranes are dry. Posterior pharynx clear of any exudate or lesions.Normal dentition.  Neck: normal, supple, no masses, no thyromegaly Respiratory: Diminished breathing sound on bilateral lower fields, fine crackles on bilateral lower fields, increasing respiratory effort. No accessory muscle use.  Cardiovascular: Regular rate and rhythm, no murmurs / rubs / gallops.  2+ extremity edema. 2+ pedal pulses. No carotid bruits.  Abdomen: no tenderness, no masses palpated. No hepatosplenomegaly. Bowel sounds positive.  Musculoskeletal: no clubbing / cyanosis. No joint deformity upper and lower extremities. Good ROM, no contractures. Normal muscle tone.  Skin: no rashes, lesions, ulcers. No induration Neurologic: No facial droops, moving all limbs, following simple commands Psychiatric: Awake, confused    Labs on Admission: I have personally reviewed following labs and imaging studies  CBC: Recent Labs  Lab 12/13/24 0812  WBC 14.6*  NEUTROABS 12.4*  HGB 11.4*  HCT 37.3*  MCV 86.1  PLT 329   Basic Metabolic Panel: Recent Labs  Lab 12/13/24 0812  NA 134*  K 5.8*  CL 96*  CO2 13*  GLUCOSE 322*  BUN 93*  CREATININE 4.60*  CALCIUM  9.3   GFR: Estimated Creatinine Clearance: 15.9 mL/min (A) (by C-G formula based on SCr of 4.6 mg/dL (H)). Liver Function Tests: Recent Labs  Lab  12/13/24 0812  AST 51*  ALT 22  ALKPHOS 120  BILITOT 0.9  PROT 6.6  ALBUMIN 3.8   No results for input(s): LIPASE, AMYLASE in the last 168 hours. No results for input(s): AMMONIA in the last 168 hours. Coagulation Profile: Recent Labs  Lab 12/13/24 1035  INR 1.8*   Cardiac Enzymes: No results for input(s): CKTOTAL, CKMB, CKMBINDEX, TROPONINI in the last 168 hours. BNP (last 3 results) Recent Labs    12/13/24 0812  PROBNP 24,052.0*   HbA1C: No results for input(s): HGBA1C in the last 72 hours. CBG: No results for input(s): GLUCAP in the last 168 hours. Lipid Profile: No results for input(s): CHOL, HDL, LDLCALC, TRIG, CHOLHDL, LDLDIRECT in the last 72 hours. Thyroid Function Tests: No results for input(s): TSH, T4TOTAL, FREET4, T3FREE, THYROIDAB in the last 72 hours. Anemia Panel: No results for input(s): VITAMINB12, FOLATE, FERRITIN, TIBC, IRON, RETICCTPCT in the last 72 hours. Urine analysis:    Component Value Date/Time   COLORURINE YELLOW (A) 12/13/2024 0842   APPEARANCEUR CLOUDY (A) 12/13/2024 0842   LABSPEC 1.015 12/13/2024 0842   PHURINE 5.0 12/13/2024 0842   GLUCOSEU 50 (A) 12/13/2024 0842   HGBUR MODERATE (A) 12/13/2024 0842   BILIRUBINUR NEGATIVE 12/13/2024 0842   KETONESUR NEGATIVE 12/13/2024 0842   PROTEINUR 30 (A) 12/13/2024 0842   NITRITE NEGATIVE 12/13/2024 0842   LEUKOCYTESUR LARGE (A) 12/13/2024 0842    Radiological Exams on Admission: DG Chest Portable 1 View Result Date: 12/13/2024 EXAM: 1 VIEW(S) XRAY OF THE CHEST 12/13/2024 09:41:00 AM COMPARISON: 10/06/2023 CLINICAL HISTORY: Cough. FINDINGS: LUNGS AND PLEURA: Small left pleural effusion. Left lower lobe airspace opacity, possibly intracardiac in location. No pneumothorax. HEART AND MEDIASTINUM: Cardiomegaly. Atherosclerotic calcifications. BONES AND SOFT TISSUES: No acute osseous abnormality. IMPRESSION: 1. Left retrocardiac airspace opacity,  atelectasis versus pneumonia. 2. Small left pleural effusion. 3. Cardiomegaly. Electronically signed by: Katheleen Faes MD 12/13/2024 09:46 AM EST RP Workstation: [id number]    EKG: Independently reviewed.  A-fib with RVR, no acute ST changes.  Assessment/Plan Principal Problem:   AKI (  acute kidney injury) Active Problems:   Acute metabolic encephalopathy   A-fib (HCC)  (please populate well all problems here in Problem List. (For example, if patient is on BP meds at home and you resume or decide to hold them, it is a problem that needs to be her. Same for CAD, COPD, HLD and so on)  Acute metabolic encephalopathy - Multifactorial, to worsening of uremia, and acid-base abnormalities - Long discussion with wife at bedside regarding prognosis, patient has current multi organ failure including CHF and AKI, signs of impaired respiratory as well.  Short-term prognosis poor.  Family aware.  Wife informed patient is DNR/DNI and does not wish any heroic activity and does not want to involve any invasive procedure including IV pressors or hemodialysis. - Wife agreed with palliative care consultation but she is anxious to make decision for hospice at this moment, she agreed that she will reconsider hospice after medical management 24-48 hours.  AKI Acute uremia Acute azotemia Compensated combined metabolic acidosis and respiratory alkalosis - Clinically patient appeared to be volume depleted, mucous membranes are dry and likely secondary to poor oral intake for last 10 days, but patient also has signs of fluid overload with bilateral ankle edema as well as bilateral pleural effusion on CT scan - Gentle hydration bicarb drip x 10 hours then reevaluate volume status.  Hyperkalemia - Secondary to AKI, received 2 L IV bolus and 1 dose of IV Lasix  - Will give 1 dose of Lokelma  - Repeat BMP tonight  Acute on chronic HFrEF decompensated - Patient does have fluid overload however not much hypoxic at this  point, plan to hold off diuresis while waiting for repeat kidney function after 10 hours of IV hydration this evening.  NSTEMI - Patient has no chest pains, EKG negative for acute ST changes. - Clinical suspect elevated troponin secondary to demand ischemia from AKI and CHF decompensation. - Agreed with change Eliquis  to heparin  drip for now.  PAF with RVR - With signs of CHF, however given there is a concurrent hyperkalemia, digoxin is contraindicated. - Will use as needed Lopressor  for rate control  Sepsis, with acute endorgan damage - Sepsis as evidenced by tachycardia, elevated lactic acid, source infection clinically suspected UTI, pneumonia cannot be ruled out.  Will order CT chest without contrast - Continue ceftriaxone  and doxycycline  - Received 2 L IV bolus, on bicarb drip for 10 hours.  IIDM - Hold off metformin - SSI  Dementia - Hold off memantine  and Aricept , given the recent acute mentation changes. - Cut down sertraline  dose  Critical care time spent on patient 70 minutes.  DVT prophylaxis: Heparin  drip Code Status: DNR/DNI Family Communication: Wife at bedside Disposition Plan: Patient is sick with multiorgan failure, requiring close monitoring clinical progress, expect more than 2 midnight hospital stay. Consults called: Palliative care Admission status: PCU admit   Cort ONEIDA Mana MD [facility] Pager [phone]  12/13/2024, 11:29 AM       [1]  Allergies Allergen Reactions   Ramipril     Other reaction(s):   Hiccups   Empagliflozin Other (See Comments)    Other reaction(s): dysuria   "

## 2024-12-13 NOTE — Progress Notes (Signed)
"  ° °      CROSS COVER NOTE  NAME: Ethelbert Thain MRN: [id number] DOB : 03-Apr-1946 ATTENDING PHYSICIAN: Laurita Cort DASEN, MD    Date of Service   12/13/2024   HPI/Events of Note   Subjective:  ROS  HPI:  Objective:  Vitals:   12/13/24 1955 12/13/24 2100 12/13/24 2202 12/13/24 2251  BP: (!) 137/91  91/74 (!) 129/117  Pulse: (!) 152     Resp: (!) 22 20    Temp: 98.2 F (36.8 C)     TempSrc: Oral     SpO2: 100% 100%    Weight:      Height:        Physical Exam    Interventions   Assessment/Plan: Lopressor  Amiodarone  IV X    *** professional thanks      To reach the provider On-Call:   7AM- 7PM see care teams to locate the attending and reach out to them via [email or web address] Password: [email or web address] 7PM-7AM contact night-coverage If you still have difficulty reaching the appropriate provider, please page the [facility] (Director on Call) for [facility] on amion for assistance  This document was prepared using Conservation officer, historic buildings and may include unintentional dictation errors.  Rockie Rams  FNP-BC, PMHNP-BC Nurse Practitioner [facility] [place]  "

## 2024-12-13 NOTE — ED Notes (Signed)
 At this time, this EDT, Mckenna NT and [facility] NT, helped this tech do a in & out cath. Pt was cooperative, cath was successful and little urine output. Pt tolerated minimal discomfort and pts family was at beside the entire time. Pt has been provided with peri care, fresh chux and briefs. Pt is now comfortable in bed, Heather RN walked in and now speaking to family. No other requests at this time.

## 2024-12-13 NOTE — ED Triage Notes (Signed)
 Pt arrives via EMS from home for failure to thrive. Pt has dementia and lives at home with his wife. Pt has had decreased x3 weeks, and is concerned for UTI. Pt oriented to self. EMS gave fluid en route. Unsure if pt took morning medications.   CBG [phone]

## 2024-12-13 NOTE — Consult Note (Signed)
 PHARMACY - ANTICOAGULATION CONSULT NOTE  Pharmacy Consult for heparin  dosing Indication: NSTEMI  Allergies[1]  Patient Measurements: Height: 5' 11 (180.3 cm) Weight: 99.6 kg (219 lb 9.3 oz) IBW/kg (Calculated) : 75.3 HEPARIN  DW (KG): 95.8  Vital Signs: Temp: 98.2 F (36.8 C) (01/27 1955) Temp Source: Oral (01/27 1955) BP: 137/91 (01/27 1955) Pulse Rate: 152 (01/27 1955)  Labs: Recent Labs    12/13/24 0812 12/13/24 0841 12/13/24 1035 12/13/24 1937  HGB 11.4*  --   --   --   HCT 37.3*  --   --   --   PLT 329  --   --   --   APTT  --   --  32 87*  LABPROT  --   --  21.8*  --   INR  --   --  1.8*  --   HEPARINUNFRC  --   --  >1.10*  --   CREATININE 4.60*  --   --   --   CKTOTAL  --  1,534*  --   --     Estimated Creatinine Clearance: 15.9 mL/min (A) (by C-G formula based on SCr of 4.6 mg/dL (H)).   Medical History: Past Medical History:  Diagnosis Date   Asteatotic eczema    BPH (benign prostatic hyperplasia)    CAD (coronary artery disease)    Chronic cough    Chronic cough    Chronic diastolic CHF (congestive heart failure) (HCC)    Ejection fraction < 50%    Erectile dysfunction    Erectile dysfunction    Gait disorder    Gout    Grade I diastolic dysfunction    Heart attack (HCC) 2016   History of heart artery stent    History of ST elevation myocardial infarction (STEMI)    Hypertension associated with type 2 diabetes mellitus (HCC)    Hypogonadism in male    Ischemic cardiomyopathy    Low left ventricular ejection fraction    Mixed Alzheimer's and vascular dementia (HCC)    OSA on CPAP    Paroxysmal atrial fibrillation (HCC)    Sleep apnea    CPAP   Stasis dermatitis of both legs    Stroke (HCC) 2009   Peripheral vision impairment   Type 2 diabetes mellitus (HCC)    Type 2 diabetes mellitus with other specified complication (HCC)     Medications:  PTA apixaban  5mg  BID (last dose 01/26 ~1800)  Assessment: James [name] is a 21 yoM  presenting with concerns for NSTEMI. Past medical history is notable for CAD and ischemic cardiomyopathy, STEMI (2016), TIA, CHF, HLD, paroxysmal A-fib, DM, anticoagulated on Eliquis  (last dose 1/26 ~1800), and progressive dementia.  On arrival, unconfirmed EKG demonstrating atrial fibrillation and nonspecific T abnormalities. Troponin 693. CT chest has been ordered.  Baseline hgb 11.4, plt 329, INR 1.8, aPTT 32, and HL >1.10.  Goal of Therapy:  Heparin  level 0.3-0.7 units/ml aPTT 66-102 seconds Monitor platelets by anticoagulation protocol: Yes   01/27 1937 aPTT 87, therapeutic x 1  Plan:  aPTT therapeutic x 1 Continue heparin  infusion at 1200 units/hr Recheck aPTT in 8 hours to confirm and HL daily Adjust based on aPTT until correlation with Anti-Xa level CBC daily while on heparin   Kayla JULIANNA Blew, PharmD, BCPS 12/13/2024,8:03 PM       [1]  Allergies Allergen Reactions   Ramipril     Other reaction(s):   Hiccups   Empagliflozin Other (See Comments)    Other reaction(s): dysuria

## 2024-12-13 NOTE — ED Provider Notes (Signed)
 "  [facility] Provider Note    Event Date/Time   First MD Initiated Contact with Patient 12/13/24 [phone]     (approximate)   History   Failure To Thrive   HPI  James [name] is a 79 y.o. male who presents to the ED for evaluation of Failure To Thrive   I reviewed PCP visit from last month.  History of CAD and ischemic cardiomyopathy, paroxysmal A-fib, DM, anticoagulated on Eliquis .  Wife brings patient to the ED for evaluation of rapid decline over the past couple days superimposed on progressive dementia chronically.  Nonspecific weakness without any acute events such as falls, known syncope.  Wife reports 1 known episode of loose diarrhea without blood this morning but uncertain about urinary output and other bowel movements as patient often brings himself to the toilet.   Physical Exam   Triage Vital Signs: ED Triage Vitals  Encounter Vitals Group     BP      Girls Systolic BP Percentile      Girls Diastolic BP Percentile      Boys Systolic BP Percentile      Boys Diastolic BP Percentile      Pulse      Resp      Temp      Temp src      SpO2      Weight      Height      Head Circumference      Peak Flow      Pain Score      Pain Loc      Pain Education      Exclude from Growth Chart     Most recent vital signs: Vitals:   12/13/24 0930 12/13/24 0945  BP: 109/85   Pulse: (!) 50   Resp: 19   Temp:    SpO2:  99%    General: Awake, no distress.  Pleasantly demented, has no complaints and reports feeling well, looks dry CV:  Good peripheral perfusion.  Resp:  Normal effort.  Abd:  No distention.  Right sided abdominal tenderness with mild, no peritoneal features MSK:  No deformity noted.  Neuro:  No focal deficits appreciated. Other:     ED Results / Procedures / Treatments   Labs (all labs ordered are listed, but only abnormal results are displayed) Labs Reviewed  CBC WITH DIFFERENTIAL/PLATELET - Abnormal; Notable for the  following components:      Result Value   WBC 14.6 (*)    Hemoglobin 11.4 (*)    HCT 37.3 (*)    Neutro Abs 12.4 (*)    Abs Immature Granulocytes 0.10 (*)    All other components within normal limits  COMPREHENSIVE METABOLIC PANEL WITH GFR - Abnormal; Notable for the following components:   Sodium 134 (*)    Potassium 5.8 (*)    Chloride 96 (*)    CO2 13 (*)    Glucose, Bld 322 (*)    BUN 93 (*)    Creatinine, Ser 4.60 (*)    AST 51 (*)    GFR, Estimated 12 (*)    Anion gap 26 (*)    All other components within normal limits  URINALYSIS, ROUTINE W REFLEX MICROSCOPIC - Abnormal; Notable for the following components:   Color, Urine YELLOW (*)    APPearance CLOUDY (*)    Glucose, UA 50 (*)    Hgb urine dipstick MODERATE (*)    Protein, ur 30 (*)  Leukocytes,Ua LARGE (*)    Bacteria, UA MANY (*)    All other components within normal limits  PRO BRAIN NATRIURETIC PEPTIDE - Abnormal; Notable for the following components:   Pro Brain Natriuretic Peptide 24,052.0 (*)    All other components within normal limits  LACTIC ACID, PLASMA - Abnormal; Notable for the following components:   Lactic Acid, Venous 7.2 (*)    All other components within normal limits  TROPONIN T, HIGH SENSITIVITY - Abnormal; Notable for the following components:   Troponin T High Sensitivity 693 (*)    All other components within normal limits  RESP PANEL BY RT-PCR (RSV, FLU A&B, COVID)  RVPGX2  CULTURE, BLOOD (ROUTINE X 2)  CULTURE, BLOOD (ROUTINE X 2)  URINE CULTURE  APTT  BLOOD GAS, VENOUS  PROTIME-INR    EKG Low amplitude, A-fib at a rate of 103 bpm, few PVCs, no STEMI, nonspecific changes are present  RADIOLOGY 1 view CXR interpreted me with left pleural effusion, cardiomegaly and a possible retrocardiac opacity versus atelectasis  Official radiology report(s): DG Chest Portable 1 View Result Date: 12/13/2024 EXAM: 1 VIEW(S) XRAY OF THE CHEST 12/13/2024 09:41:00 AM COMPARISON: 10/06/2023  CLINICAL HISTORY: Cough. FINDINGS: LUNGS AND PLEURA: Small left pleural effusion. Left lower lobe airspace opacity, possibly intracardiac in location. No pneumothorax. HEART AND MEDIASTINUM: Cardiomegaly. Atherosclerotic calcifications. BONES AND SOFT TISSUES: No acute osseous abnormality. IMPRESSION: 1. Left retrocardiac airspace opacity, atelectasis versus pneumonia. 2. Small left pleural effusion. 3. Cardiomegaly. Electronically signed by: Katheleen Faes MD 12/13/2024 09:46 AM EST RP Workstation: [id number]    PROCEDURES and INTERVENTIONS:  .Critical Care  Performed by: Claudene Rover, MD Authorized by: Claudene Rover, MD   Critical care provider statement:    Critical care time (minutes):  30   Critical care time was exclusive of:  Separately billable procedures and treating other patients   Critical care was necessary to treat or prevent imminent or life-threatening deterioration of the following conditions:  Metabolic crisis   Critical care was time spent personally by me on the following activities:  Development of treatment plan with patient or surrogate, discussions with consultants, evaluation of patient's response to treatment, examination of patient, ordering and review of laboratory studies, ordering and review of radiographic studies, ordering and performing treatments and interventions, pulse oximetry, re-evaluation of patient's condition and review of old charts .1-3 Lead EKG Interpretation  Performed by: Claudene Rover, MD Authorized by: Claudene Rover, MD     Interpretation: normal     ECG rate:  99   ECG rate assessment: normal     Rhythm: atrial fibrillation     Ectopy: none     Conduction: normal     Medications  doxycycline  (VIBRAMYCIN ) 100 mg in sodium chloride  0.9 % 250 mL IVPB (has no administration in time range)  heparin  injection 4,000 Units (has no administration in time range)  sodium chloride  0.9 % bolus 1,000 mL (1,000 mLs Intravenous New Bag/Given 12/13/24 0901)   furosemide  (LASIX ) injection 60 mg (60 mg Intravenous Given 12/13/24 0903)  insulin  aspart (novoLOG ) injection 5 Units (5 Units Intravenous Given 12/13/24 0901)  sodium chloride  0.9 % bolus 1,000 mL (1,000 mLs Intravenous New Bag/Given 12/13/24 1022)  cefTRIAXone  (ROCEPHIN ) 2 g in sodium chloride  0.9 % 100 mL IVPB (0 g Intravenous Stopped 12/13/24 0958)     IMPRESSION / MDM / ASSESSMENT AND PLAN / ED COURSE  I reviewed the triage vital signs and the nursing notes.  Differential diagnosis includes, but is not  limited to, sepsis, viral syndrome, renal failure, urologic obstruction  {Patient presents with symptoms of an acute illness or injury that is potentially life-threatening.  Patient presents from home with generalized weakness and nonfocal symptoms with signs of multiorgan failure and likely end-of-life care.  He is generally asymptomatic but does have some lower abdominal discomfort on palpation, otherwise nonfocal exam.  Appears dry  Signs of severe sepsis with leukocytosis, presenting tachycardia and tachypnea.  He remains on room air without signs of hemodynamic shock.  AKI mild hyperkalemia is noted with anion gap.  Urine with infectious features and sent for culture.  Elevated lactic acid and troponin, BNP.   Due to him appearing dry on exam start 2 L normal saline in addition to antibiotics, small dose insulin  and IV Lasix  to assist with hyperkalemia.  Family is understanding of the poor prognosis.  I consult with hospitalist who agrees to admit  Clinical Course as of 12/13/24 1039  Tue Dec 13, 2024  0911 Urinalysis concerning for infection, will send this for culture, add on blood cultures and lactic as he does meet SIRS criteria.  Will obtain a CT abdomen/pelvis to ensure no urologic obstruction [DS]  1025 Reassess.  Clinically the same, pleasantly disoriented and without complaints.  I update wife of results and generally poor prognosis.  We discussed UTI, severe sepsis,  NSTEMI, renal dysfunction.  She reaffirms DNR status, medications, fluids, antibiotics okay but nothing more invasive.  She knowledges this is very possibly end-of-life care.  [DS]  1037 I consulted medicine who agrees to admit [DS]    Clinical Course User Index [DS] Claudene Rover, MD     FINAL CLINICAL IMPRESSION(S) / ED DIAGNOSES   Final diagnoses:  End of life care  Severe sepsis (HCC)  AKI (acute kidney injury)  NSTEMI (non-ST elevated myocardial infarction) (HCC)     Rx / DC Orders   ED Discharge Orders     None        Note:  This document was prepared using Dragon voice recognition software and may include unintentional dictation errors.   Claudene Rover, MD 12/13/24 1041  "

## 2024-12-13 NOTE — Progress Notes (Signed)
 MD made aware of cardiac rhythm changes. No new orders.

## 2024-12-13 NOTE — Progress Notes (Signed)
 No PNA on CT, will D/C doxycycline .

## 2024-12-13 NOTE — Progress Notes (Signed)
" ° °  Brief Progress Note   _____________________________________________________________________________________________________________  Patient Name: James [name] Patient DOB: [birth year] Date: @TODAY @      Data: Reviewed labs, notes, VS.    Action: No action needed at this time.      Response:    _____________________________________________________________________________________________________________  [facility] RN Expeditor Sharolyn JONETTA Batman Please contact us  directly via secure chat (search for [facility]) or by calling us  at [phone] [facility]).  "

## 2024-12-13 NOTE — Consult Note (Addendum)
 PHARMACY - ANTICOAGULATION CONSULT NOTE  Pharmacy Consult for heparin  dosing Indication: NSTEMI  Allergies[1]  Patient Measurements: Height: 5' 11 (180.3 cm) Weight: 99.6 kg (219 lb 9.3 oz) IBW/kg (Calculated) : 75.3 HEPARIN  DW (KG): 95.8  Vital Signs: Temp: 97.5 F (36.4 James [name]) (01/27 0814) Temp Source: Axillary (01/27 0814) BP: 109/85 (01/27 0930) Pulse Rate: 50 (01/27 0930)  Labs: Recent Labs    12/13/24 0812  HGB 11.4*  HCT 37.3*  PLT 329  CREATININE 4.60*    Estimated Creatinine Clearance: 15.9 mL/min (A) (by James [name]-G formula based on SCr of 4.6 mg/dL (H)).   Medical History: Past Medical History:  Diagnosis Date   Asteatotic eczema    BPH (benign prostatic hyperplasia)    CAD (coronary artery disease)    Chronic cough    Chronic cough    Chronic diastolic CHF (congestive heart failure) (HCC)    Ejection fraction < 50%    Erectile dysfunction    Erectile dysfunction    Gait disorder    Gout    Grade I diastolic dysfunction    Heart attack (HCC) 2016   History of heart artery stent    History of ST elevation myocardial infarction (STEMI)    Hypertension associated with type 2 diabetes mellitus (HCC)    Hypogonadism in male    Ischemic cardiomyopathy    Low left ventricular ejection fraction    Mixed Alzheimer's and vascular dementia (HCC)    OSA on CPAP    Paroxysmal atrial fibrillation (HCC)    Sleep apnea    CPAP   Stasis dermatitis of both legs    Stroke (HCC) 2009   Peripheral vision impairment   Type 2 diabetes mellitus (HCC)    Type 2 diabetes mellitus with other specified complication (HCC)     Medications:  PTA apixaban  5mg  BID (last dose 01/26 ~1800)  Assessment: James James [name] is a 44 yoM presenting with concerns for NSTEMI. Past medical history is notable for CAD and ischemic cardiomyopathy, STEMI (2016), TIA, CHF, HLD, paroxysmal A-fib, DM, anticoagulated on Eliquis  (last dose 1/26 ~1800), and progressive dementia.  On arrival,  unconfirmed EKG demonstrating atrial fibrillation and nonspecific T abnormalities. Troponin 693. CT chest has been ordered.  Baseline hgb 11.4, plt 329, INR 1.8, aPTT 32, and HL >1.10.  Goal of Therapy:  Heparin  level 0.3-0.7 units/ml aPTT 66-102 seconds Monitor platelets by anticoagulation protocol: Yes   Plan:  Last apixaban  dose given over 12 hours ago, and BMI ~30 so will order heparin  bolus Give 4000 units bolus x 1, and start heparin  infusion at 1200 units/hr Continue to monitor for appropriate transition from heparin  gtt to home apixaban  Check aPTT in 8 hours and daily until correlation with HL Monitor HL daily Continue to monitor H&H and platelets with daily CBC  James James [name] James James [name] 12/13/2024,10:33 AM      [1]  Allergies Allergen Reactions   Ramipril     Other reaction(s):   Hiccups   Empagliflozin Other (See Comments)    Other reaction(s): dysuria

## 2024-12-14 ENCOUNTER — Inpatient Hospital Stay: Admit: 2024-12-14 | Discharge: 2024-12-14 | Disposition: A | Attending: Student

## 2024-12-14 ENCOUNTER — Inpatient Hospital Stay

## 2024-12-14 DIAGNOSIS — N179 Acute kidney failure, unspecified: Secondary | ICD-10-CM | POA: Diagnosis not present

## 2024-12-14 LAB — GLUCOSE, CAPILLARY
Glucose-Capillary: 261 mg/dL — ABNORMAL HIGH (ref 70–99)
Glucose-Capillary: 230 mg/dL — ABNORMAL HIGH (ref 70–99)
Glucose-Capillary: 224 mg/dL — ABNORMAL HIGH (ref 70–99)
Glucose-Capillary: 224 mg/dL — ABNORMAL HIGH (ref 70–99)

## 2024-12-14 LAB — HEPARIN LEVEL (UNFRACTIONATED): Heparin Unfractionated: >1.1 [IU]/mL — ABNORMAL HIGH (ref 0.30–0.70)

## 2024-12-14 LAB — CBC
HCT: 35.2 % — ABNORMAL LOW (ref 39.0–52.0)
Hemoglobin: 11 g/dL — ABNORMAL LOW (ref 13.0–17.0)
MCH: 26 pg (ref 26.0–34.0)
MCHC: 31.3 g/dL (ref 30.0–36.0)
MCV: 83.2 fL (ref 80.0–100.0)
Platelets: 210 10*3/uL (ref 150–400)
RBC: 4.23 MIL/uL (ref 4.22–5.81)
RDW: 15.4 % (ref 11.5–15.5)
WBC: 12 10*3/uL — ABNORMAL HIGH (ref 4.0–10.5)
nRBC: 0 % (ref 0.0–0.2)

## 2024-12-14 LAB — BASIC METABOLIC PANEL WITH GFR
Anion gap: 23 — ABNORMAL HIGH (ref 5–15)
BUN: 93 mg/dL — ABNORMAL HIGH (ref 8–23)
CO2: 13 mmol/L — ABNORMAL LOW (ref 22–32)
Calcium: 8.4 mg/dL — ABNORMAL LOW (ref 8.9–10.3)
Chloride: 100 mmol/L (ref 98–111)
Creatinine, Ser: 4.46 mg/dL — ABNORMAL HIGH (ref 0.61–1.24)
GFR, Estimated: 13 mL/min — ABNORMAL LOW
Glucose, Bld: 260 mg/dL — ABNORMAL HIGH (ref 70–99)
Potassium: 5 mmol/L (ref 3.5–5.1)
Sodium: 136 mmol/L (ref 135–145)

## 2024-12-14 LAB — APTT: aPTT: 79 s — ABNORMAL HIGH (ref 24–36)

## 2024-12-14 MED ORDER — ACETAMINOPHEN 650 MG RE SUPP: 650.0000 mg | Freq: Four times a day (QID) | RECTAL | Status: DC | PRN

## 2024-12-14 MED ORDER — INSULIN GLARGINE-YFGN 100 UNIT/ML ~~LOC~~ SOLN
10.0000 [IU] | Freq: Every day | SUBCUTANEOUS | Status: DC
  Administered 2024-12-14 – 2024-12-15 (×2): 10 [IU] via SUBCUTANEOUS
  Filled 2024-12-14 (×2): qty 0.1

## 2024-12-14 MED ORDER — SODIUM CHLORIDE 0.45 % IV SOLN
INTRAVENOUS | Status: AC
  Filled 2024-12-14: qty 75

## 2024-12-14 MED ORDER — MIDODRINE HCL 5 MG PO TABS
10.0000 mg | ORAL_TABLET | Freq: Three times a day (TID) | ORAL | Status: DC
  Administered 2024-12-14 – 2024-12-16 (×6): 10 mg via ORAL
  Filled 2024-12-14 (×6): qty 2

## 2024-12-14 MED ORDER — ACETAMINOPHEN 325 MG PO TABS: 650.0000 mg | ORAL_TABLET | Freq: Four times a day (QID) | ORAL | Status: DC | PRN

## 2024-12-14 MED ORDER — SODIUM CHLORIDE 0.9 % IV SOLN: INTRAVENOUS | Status: AC

## 2024-12-14 MED ORDER — MIDODRINE HCL 5 MG PO TABS
10.0000 mg | ORAL_TABLET | Freq: Once | ORAL | Status: AC
  Administered 2024-12-14: 10 mg via ORAL
  Filled 2024-12-14: qty 2

## 2024-12-14 NOTE — Progress Notes (Signed)
 Attempted to sit patient up on side of bed but patient tolerated poorly. Unable to assist in maintaining posture.

## 2024-12-14 NOTE — Progress Notes (Signed)
 MD Agbata made aware of patient's BP. New orders for Midodrine .

## 2024-12-14 NOTE — Consult Note (Signed)
 " [facility]  CONSULT NOTE    Date: 12/14/2024                  Patient Name:  James [name]  MRN: [id number]  DOB: 08-20-1946  Age / Sex: 79 y.o., male         PCP: Lenon Layman ORN, MD                 Service Requesting Consult: [facility]                 Reason for Consult: Acute kidney injury            History of Present Illness: James [name] is a 79 y.o.  male with past medical conditions including CAD, STEMI status post PCI, chronic heart failure, diabetes, atrial fibrillation on Eliquis , and dementia, who was admitted to [facility] on 12/13/2024 for End of life care [Z51.5] NSTEMI (non-ST elevated myocardial infarction) (HCC) [I21.4] AKI (acute kidney injury) [N17.9] Severe sepsis (HCC) [A41.9, R65.20]   Patient presents to the emergency department with altered mental status and poor oral intake.  Patient seen at bedside with caregiver.  Caregiver states patient has had progressive loss of appetite over the past week or 2.  States he normally sleeps a lot during the day however this is increased.  Weakness and fatigue has also increased.  Patient requires assistance with positioning and getting up from bed.  Labs on ED arrival concerning for potassium 5.8, serum bicarb 13, BUN 93, creatinine 4.60 with GFR 12, BNP greater than 24,000 and troponin 693.  CT chest negative.  Chest x-ray shows small left pleural effusion.  CT abdomen pelvis negative for acute findings.  Medications: Outpatient medications: Medications Prior to Admission  Medication Sig Dispense Refill Last Dose/Taking   allopurinol  (ZYLOPRIM ) 100 MG tablet Take 1 tablet by mouth daily.   12/12/2024 Evening   apixaban  (ELIQUIS ) 5 MG TABS tablet Take 5 mg by mouth 2 (two) times daily.   12/12/2024 Evening   aspirin  81 MG tablet Take 81 mg by mouth daily.   12/12/2024 Morning   atorvastatin  (LIPITOR ) 80 MG tablet Take 1 tablet by mouth.   12/12/2024 Morning   buPROPion  (WELLBUTRIN  XL) 150 MG 24 hr tablet  Take 150 mg by mouth daily.   12/12/2024 Morning   cyanocobalamin (VITAMIN B12) 1000 MCG tablet Take 1,000 mcg by mouth daily.   12/12/2024 Evening   donepezil  (ARICEPT ) 10 MG tablet TAKE 1 TABLET BY MOUTH EVERY DAY AT NIGHT  2 12/12/2024 Evening   metFORMIN (GLUCOPHAGE-XR) 500 MG 24 hr tablet Take 1,500 mg by mouth daily.   12/12/2024 Evening   metoprolol  succinate (TOPROL -XL) 25 MG 24 hr tablet Take 25 mg by mouth daily.   12/12/2024 Morning   POTASSIUM CHLORIDE PO Take 10 mEq by mouth daily.   12/12/2024 Morning   torsemide (DEMADEX) 20 MG tablet Take 20 mg by mouth daily.   12/12/2024 Morning   Vitamin D, Cholecalciferol, 1000 UNITS CAPS Take by mouth daily.   12/12/2024 Morning   colchicine 0.6 MG tablet Take 2 tablets (1.2mg ) by mouth at first sign of gout flare followed by 1 tablet (0.6mg ) after 1 hour. (Max 1.8mg  within 1 hour) (Patient not taking: Reported on 01/14/2024)   Unknown   memantine  (NAMENDA ) 10 MG tablet Take 10 mg by mouth 2 (two) times daily. (Patient not taking: Reported on 12/13/2024)   Not Taking   neomycin-polymyxin b-dexamethasone (MAXITROL) 3.5-10000-0.1 OINT Place 1  Application into the right eye 4 (four) times daily. (Patient not taking: Reported on 12/13/2024)   Not Taking   nitroGLYCERIN (NITROSTAT) 0.4 MG SL tablet Place under the tongue as needed.   Unknown   ofloxacin (OCUFLOX) 0.3 % ophthalmic solution Place 1 drop into the right eye 4 (four) times daily. (Patient not taking: Reported on 12/13/2024)   Not Taking   prednisoLONE acetate (PRED FORTE) 1 % ophthalmic suspension Place 1 drop into the right eye 4 (four) times daily. (Patient not taking: Reported on 12/13/2024)   Not Taking   sertraline  (ZOLOFT ) 50 MG tablet Take 1 tablet by mouth daily. (Patient not taking: Reported on 12/13/2024)   Not Taking    Current medications: Current Facility-Administered Medications  Medication Dose Route Frequency Provider Last Rate Last Admin   acetaminophen  (TYLENOL ) tablet 650 mg  650  mg Oral Q6H PRN Patel, Kishan S, RPH       Or   acetaminophen  (TYLENOL ) suppository 650 mg  650 mg Rectal Q6H PRN Patel, Kishan S, RPH       allopurinol  (ZYLOPRIM ) tablet 100 mg  100 mg Oral Daily Laurita Manor T, MD       amiodarone  (NEXTERONE  PREMIX) 360-4.14 MG/200ML-% (1.8 mg/mL) IV infusion  30 mg/hr Intravenous Continuous Foust, Katy L, NP 16.67 mL/hr at 12/14/24 0525 30 mg/hr at 12/14/24 0525   aspirin  chewable tablet 81 mg  81 mg Oral Daily Laurita Manor T, MD   81 mg at 12/14/24 0830   buPROPion  (WELLBUTRIN  XL) 24 hr tablet 150 mg  150 mg Oral Daily Laurita Manor T, MD   150 mg at 12/14/24 0830   cefTRIAXone  (ROCEPHIN ) 2 g in sodium chloride  0.9 % 100 mL IVPB  2 g Intravenous Q24H Laurita Manor T, MD 200 mL/hr at 12/14/24 0838 2 g at 12/14/24 9161   Chlorhexidine  Gluconate Cloth 2 % PADS 6 each  6 each Topical Daily Laurita Manor DASEN, MD   6 each at 12/14/24 0831   heparin  ADULT infusion 100 units/mL (25000 units/250mL)  1,200 Units/hr Intravenous Continuous Viviana Leonor BROCKS, [place] 12 mL/hr at 12/14/24 9386 1,200 Units/hr at 12/14/24 9386   HYDROmorphone  (DILAUDID ) injection 0.5-1 mg  0.5-1 mg Intravenous Q2H PRN Laurita Manor T, MD       insulin  aspart (novoLOG ) injection 0-9 Units  0-9 Units Subcutaneous TID WC Laurita Manor T, MD   3 Units at 12/14/24 1220   insulin  glargine-yfgn injection 10 Units  10 Units Subcutaneous QHS Agbata, Tochukwu, MD       metoprolol  tartrate (LOPRESSOR ) injection 5 mg  5 mg Intravenous Q4H PRN Laurita Manor T, MD   5 mg at 12/14/24 1136   midodrine  (PROAMATINE ) tablet 10 mg  10 mg Oral TID WC Agbata, Tochukwu, MD   10 mg at 12/14/24 1220   ondansetron  (ZOFRAN ) tablet 4 mg  4 mg Oral Q6H PRN Laurita Manor T, MD       Or   ondansetron  (ZOFRAN ) injection 4 mg  4 mg Intravenous Q6H PRN Laurita Manor T, MD       tamsulosin  (FLOMAX ) capsule 0.4 mg  0.4 mg Oral QPC supper Laurita Manor T, MD   0.4 mg at 12/13/24 1710   traZODone  (DESYREL ) tablet 25 mg  25 mg Oral QHS PRN Laurita Manor DASEN,  MD          Allergies: Allergies[1]    Past Medical History: Past Medical History:  Diagnosis Date   Asteatotic eczema    BPH (  benign prostatic hyperplasia)    CAD (coronary artery disease)    Chronic cough    Chronic cough    Chronic diastolic CHF (congestive heart failure) (HCC)    Ejection fraction < 50%    Erectile dysfunction    Erectile dysfunction    Gait disorder    Gout    Grade I diastolic dysfunction    Heart attack (HCC) 2016   History of heart artery stent    History of ST elevation myocardial infarction (STEMI)    Hypertension associated with type 2 diabetes mellitus (HCC)    Hypogonadism in male    Ischemic cardiomyopathy    Low left ventricular ejection fraction    Mixed Alzheimer's and vascular dementia (HCC)    OSA on CPAP    Paroxysmal atrial fibrillation (HCC)    Sleep apnea    CPAP   Stasis dermatitis of both legs    Stroke (HCC) 2009   Peripheral vision impairment   Type 2 diabetes mellitus (HCC)    Type 2 diabetes mellitus with other specified complication [facility])      Past Surgical History: Past Surgical History:  Procedure Laterality Date   abdominal cyst surgery     CARDIAC CATHETERIZATION  2016   3 stents   CATARACT EXTRACTION W/PHACO Left 01/25/2024   Procedure: CATARACT EXTRACTION PHACO AND INTRAOCULAR LENS PLACEMENT (IOC) LEFT DIABETIC 7.63, 00:43.3;  Surgeon: Myrna Adine Anes, MD;  Location[facility];  Service: Ophthalmology;  Laterality: Left;   RETINAL DETACHMENT SURGERY Right 07/22/2023   [facility]     Family History: Family History  Problem Relation Age of Onset   Kidney disease Neg Hx    Prostate cancer Neg Hx    Bladder Cancer Neg Hx    Kidney cancer Neg Hx      Social History: Social History   Socioeconomic History   Marital status: Married    Spouse name: Not on file   Number of children: Not on file   Years of education: Not on file   Highest education level: Not on file  Occupational History   Not  on file  Tobacco Use   Smoking status: Former    Current packs/day: 0.00    Average packs/day: 1.5 packs/day for 25.0 years (37.5 ttl pk-yrs)    Types: Cigarettes    Start date: 11/18/1959    Quit date: 11/17/1984    Years since quitting: 40.1   Smokeless tobacco: Never   Tobacco comments:    years ago  Vaping Use   Vaping status: Never Used  Substance and Sexual Activity   Alcohol use: No    Alcohol/week: 0.0 standard drinks of alcohol   Drug use: No   Sexual activity: Not on file  Other Topics Concern   Not on file  Social History Narrative   Not on file   Social Drivers of Health   Tobacco Use: Medium Risk (10/31/2024)   Received from [facility]   Patient History    Smoking Tobacco Use: Former    Smokeless Tobacco Use: Never    Passive Exposure: Not on file  Financial Resource Strain: Low Risk  (09/07/2024)   Received from [facility]   Overall Financial Resource Strain (CARDIA)    Difficulty of Paying Living Expenses: Not hard at all  Food Insecurity: No Food Insecurity (09/07/2024)   Received from [facility]   Epic    Within the past 12 months, you worried that  your food would run out before you got the money to buy more.: Never true    Within the past 12 months, the food you bought just didn't last and you didn't have money to get more.: Never true  Transportation Needs: No Transportation Needs (09/07/2024)   Received from [facility]    In the past 12 months, has lack of transportation kept you from medical appointments or from getting medications?: No    Lack of Transportation (Non-Medical): No  Physical Activity: Not on file  Stress: Not on file  Social Connections: Not on file  Intimate Partner Violence: Not on file  Depression (EYV7-0): Not on file  Alcohol Screen: Not on file  Housing: Low Risk  (09/07/2024)   Received from [facility]    Epic    In the last 12 months, was there a time when you were not able to pay the mortgage or rent on time?: No    In the past 12 months, how many times have you moved where you were living?: 0    At any time in the past 12 months, were you homeless or living in a shelter (including now)?: No  Utilities: Not At Risk (09/07/2024)   Received from [facility]   Epic    In the past 12 months has the electric, gas, oil, or water company threatened to shut off services in your home?: No  Health Literacy: Not on file     Review of Systems: Review of Systems  Constitutional:  Negative for chills, fever and malaise/fatigue.  HENT:  Negative for congestion, sore throat and tinnitus.   Eyes:  Negative for blurred vision and redness.  Respiratory:  Negative for cough, shortness of breath and wheezing.   Cardiovascular:  Negative for chest pain, palpitations, claudication and leg swelling.  Gastrointestinal:  Negative for abdominal pain, blood in stool, diarrhea, nausea and vomiting.  Genitourinary:  Negative for flank pain, frequency and hematuria.  Musculoskeletal:  Negative for back pain, falls and myalgias.  Skin:  Negative for rash.  Neurological:  Positive for weakness. Negative for dizziness and headaches.  Endo/Heme/Allergies:  Does not bruise/bleed easily.  Psychiatric/Behavioral:  Negative for depression. The patient is not nervous/anxious and does not have insomnia.     Vital Signs: Blood pressure (!) 128/111, pulse (!) 124, temperature 98.4 F (36.9 C), temperature source Oral, resp. rate (!) 23, height 5' 11 (1.803 m), weight 99.6 kg, SpO2 100%.  Weight trends: Filed Weights   12/13/24 0809  Weight: 99.6 kg    Physical Exam: General: NAD  Head: Normocephalic, atraumatic. Moist oral mucosal membranes  Eyes: Anicteric  Heart: Regular rate and rhythm  Abdomen:  Soft, nontender,   Extremities: No peripheral edema.  Neurologic: Awake and alert  Skin: No  lesions  Access: None     Lab results: Basic Metabolic Panel: Recent Labs  Lab 12/13/24 0812 12/13/24 0841 12/13/24 1937 12/14/24 0436  NA 134*  --  136 136  K 5.8*  --  4.7 5.0  CL 96*  --  101 100  CO2 13*  --  16* 13*  GLUCOSE 322*  --  154* 260*  BUN 93*  --  90* 93*  CREATININE 4.60*  --  4.22* 4.46*  CALCIUM  9.3  --  8.4* 8.4*  MG  --  2.3  --   --   PHOS  --  7.5*  --   --  Liver Function Tests: Recent Labs  Lab 12/13/24 0812  AST 51*  ALT 22  ALKPHOS 120  BILITOT 0.9  PROT 6.6  ALBUMIN 3.8   No results for input(s): LIPASE, AMYLASE in the last 168 hours. No results for input(s): AMMONIA in the last 168 hours.  CBC: Recent Labs  Lab 12/13/24 0812 12/14/24 0436  WBC 14.6* 12.0*  NEUTROABS 12.4*  --   HGB 11.4* 11.0*  HCT 37.3* 35.2*  MCV 86.1 83.2  PLT 329 210    Cardiac Enzymes: Recent Labs  Lab 12/13/24 0841  CKTOTAL 1,534*    BNP: Invalid input(s): POCBNP  CBG: Recent Labs  Lab 12/13/24 1657 12/13/24 2054 12/14/24 0744 12/14/24 1150 12/14/24 1622  GLUCAP 172* 139* 261* 230* 224*    Microbiology: Results for orders placed or performed during the hospital encounter of 12/13/24  Urine Culture     Status: Abnormal (Preliminary result)   Collection Time: 12/13/24  8:41 AM   Specimen: Urine, Clean Catch  Result Value Ref Range Status   Specimen Description   Final    URINE, CLEAN CATCH Performed at [facility], [address] [place], [place] [address]    Special Requests   Final    NONE Performed at [facility], [address] [place], [place] [address]    Culture (A)  Final    >=100,000 COLONIES/mL GRAM NEGATIVE RODS SUSCEPTIBILITIES TO FOLLOW Performed at [facility], [address] [place], [place] [address]    Report Status PENDING  Incomplete  Resp panel by RT-PCR (RSV, Flu A&B, Covid) Anterior Nasal Swab     Status: None   Collection Time: 12/13/24  8:49 AM   Specimen:  Anterior Nasal Swab  Result Value Ref Range Status   SARS Coronavirus 2 by RT PCR NEGATIVE NEGATIVE Final    Comment: (NOTE) SARS-CoV-2 target nucleic acids are NOT DETECTED.  The SARS-CoV-2 RNA is generally detectable in upper respiratory specimens during the acute phase of infection. The lowest concentration of SARS-CoV-2 viral copies this assay can detect is 138 copies/mL. A negative result does not preclude SARS-Cov-2 infection and should not be used as the sole basis for treatment or other patient management decisions. A negative result may occur with  improper specimen collection/handling, submission of specimen other than nasopharyngeal swab, presence of viral mutation(s) within the areas targeted by this assay, and inadequate number of viral copies(<138 copies/mL). A negative result must be combined with clinical observations, patient history, and epidemiological information. The expected result is Negative.  Fact Sheet for Patients:  bloggercourse.com  Fact Sheet for Healthcare Providers:  seriousbroker.it  This test is no t yet approved or cleared by the [place]  FDA and  has been authorized for detection and/or diagnosis of SARS-CoV-2 by FDA under an Emergency Use Authorization (EUA). This EUA will remain  in effect (meaning this test can be used) for the duration of the COVID-19 declaration under Section 564(b)(1) of the Act, 21 U.S.C.section 360bbb-3(b)(1), unless the authorization is terminated  or revoked sooner.       Influenza A by PCR NEGATIVE NEGATIVE Final   Influenza B by PCR NEGATIVE NEGATIVE Final    Comment: (NOTE) The Xpert Xpress SARS-CoV-2/FLU/RSV plus assay is intended as an aid in the diagnosis of influenza from Nasopharyngeal swab specimens and should not be used as a sole basis for treatment. Nasal washings and aspirates are unacceptable for Xpert Xpress SARS-CoV-2/FLU/RSV testing.  Fact  Sheet for Patients: bloggercourse.com  Fact Sheet for Healthcare Providers: seriousbroker.it  This test is not yet approved or cleared by the [place]  FDA and has been authorized for detection and/or diagnosis of SARS-CoV-2 by FDA under an Emergency Use Authorization (EUA). This EUA will remain in effect (meaning this test can be used) for the duration of the COVID-19 declaration under Section 564(b)(1) of the Act, 21 U.S.C. section 360bbb-3(b)(1), unless the authorization is terminated or revoked.     Resp Syncytial Virus by PCR NEGATIVE NEGATIVE Final    Comment: (NOTE) Fact Sheet for Patients: bloggercourse.com  Fact Sheet for Healthcare Providers: seriousbroker.it  This test is not yet approved or cleared by the [place]  FDA and has been authorized for detection and/or diagnosis of SARS-CoV-2 by FDA under an Emergency Use Authorization (EUA). This EUA will remain in effect (meaning this test can be used) for the duration of the COVID-19 declaration under Section 564(b)(1) of the Act, 21 U.S.C. section 360bbb-3(b)(1), unless the authorization is terminated or revoked.  Performed at [facility], [address] [place], [place] [address]     Coagulation Studies: Recent Labs    12/13/24 1035  LABPROT 21.8*  INR 1.8*    Urinalysis: Recent Labs    12/13/24 0842  COLORURINE YELLOW*  LABSPEC 1.015  PHURINE 5.0  GLUCOSEU 50*  HGBUR MODERATE*  BILIRUBINUR NEGATIVE  KETONESUR NEGATIVE  PROTEINUR 30*  NITRITE NEGATIVE  LEUKOCYTESUR LARGE*      Imaging: DG Chest 1 View Result Date: 12/14/2024 EXAM: 1 VIEW(S) XRAY OF THE CHEST 12/14/2024 07:07:00 AM COMPARISON: 12/13/2024 CLINICAL HISTORY: Congestive heart failure. FINDINGS: LUNGS AND PLEURA: Stable small left pleural effusion and left basilar opacity. No pneumothorax. HEART AND MEDIASTINUM:  Cardiomegaly, unchanged. Atherosclerotic calcifications. BONES AND SOFT TISSUES: No acute osseous abnormality. IMPRESSION: 1. Stable small left pleural effusion and left basilar opacity. 2. Cardiomegaly, unchanged. Electronically signed by: Waddell Calk MD 12/14/2024 07:10 AM EST RP Workstation: [id number]   DG Chest 1 View Result Date: 12/13/2024 EXAM: 1 VIEW(S) XRAY OF THE CHEST 12/13/2024 06:08:00 PM COMPARISON: 12/13/2024 CLINICAL HISTORY: Congestive heart failure. ICD10: [id number] Congestive heart failure. FINDINGS: LUNGS AND PLEURA: Small left pleural effusion. Similar left retrocardiac opacity. No pneumothorax. HEART AND MEDIASTINUM: Cardiomegaly. BONES AND SOFT TISSUES: No acute osseous abnormality. IMPRESSION: 1. Small left pleural effusion and similar left retrocardiac opacity. No change from the prior exam from earlier in the same day. Electronically signed by: Oneil Devonshire MD 12/13/2024 11:44 PM EST RP Workstation: [id number]   CT CHEST WO CONTRAST Result Date: 12/13/2024 EXAM: CT CHEST WITHOUT CONTRAST 12/13/2024 11:36:35 AM TECHNIQUE: CT of the chest was performed without the administration of intravenous contrast. Multiplanar reformatted images are provided for review. Automated exposure control, iterative reconstruction, and/or weight based adjustment of the mA/kV was utilized to reduce the radiation dose to as low as reasonably achievable. COMPARISON: None available. CLINICAL HISTORY: Pneumonia, complication suspected, x-ray done. FINDINGS: MEDIASTINUM: Heart: Mild cardiac enlargement. 3-vessel coronary artery calcifications. Vessels: The main pulmonary artery is increased in caliber, measuring 4 cm. Aortic atherosclerosis. Airways: The central airways are clear. LYMPH NODES: No enlarged mediastinal or axillary lymph nodes. LUNGS AND PLEURA: Pleura: Small-to-moderate bilateral pleural effusions are identified, left greater than right. No pneumothorax. Lungs: No airspace consolidation to suggest  pneumonia. Mild dependent ground-glass attenuation is noted within the posterior upper and lower lung zones. Small calcified granuloma identified within the left lower lobe. SOFT TISSUES/BONES: No acute abnormality of the bones or soft tissues. UPPER ABDOMEN: Calcification identified in the  liver. No acute abnormality noted within the imaged portions of the upper abdomen. IMPRESSION: 1. No CT evidence of pneumonia. 2. Small-to-moderate bilateral pleural effusions, left greater than right. 3. Increased caliber of the main pulmonary artery compatible with pulmonary artery hypertension. 4. Aortic atherosclerosis and coronary artery calcification. Electronically signed by: Waddell Calk MD 12/13/2024 12:01 PM EST RP Workstation: [id number]   CT ABDOMEN PELVIS WO CONTRAST Result Date: 12/13/2024 EXAM: CT ABDOMEN AND PELVIS WITHOUT CONTRAST 12/13/2024 10:07:26 AM TECHNIQUE: CT of the abdomen and pelvis was performed without the administration of intravenous contrast. Multiplanar reformatted images are provided for review. Automated exposure control, iterative reconstruction, and/or weight-based adjustment of the mA/kV was utilized to reduce the radiation dose to as low as reasonably achievable. COMPARISON: None available. CLINICAL HISTORY: Acute renal failure, right-sided abdominal tenderness. Evaluate ureteral obstruction. FINDINGS: LOWER CHEST: Bilateral layering pleural effusions. LIVER: The liver is unremarkable. GALLBLADDER AND BILE DUCTS: Gallbladder is unremarkable. No biliary ductal dilatation. SPLEEN: No acute abnormality. PANCREAS: No acute abnormality. ADRENAL GLANDS: A non left adrenal adenoma. KIDNEYS, URETERS AND BLADDER: No nephrolithiasis or ureterolithiasis. No obstructive uropathy. No hydronephrosis. No perinephric or periureteral stranding. Several small bladder diverticula are present, indicating bladder outlet obstruction. GI AND BOWEL: Stomach demonstrates no acute abnormality. There is no bowel  obstruction. Appendix is normal. PERITONEUM AND RETROPERITONEUM: No ascites. No free air. VASCULATURE: Aorta is normal in caliber. LYMPH NODES: No lymphadenopathy. REPRODUCTIVE ORGANS: No acute enlargement of the posterior gland 10 mm. BONES AND SOFT TISSUES: No acute osseous abnormality. No focal soft tissue abnormality. IMPRESSION: 1. No nephrolithiasis, ureterolithiasis, or obstructive uropathy. 2. Several small bladder diverticula, potential partial bladder outlet obstruction. 3. No acute findings in the abdomen or pelvis, with normal appendix. 4. Bilateral pleural effusions. Electronically signed by: Norleen Boxer MD 12/13/2024 11:28 AM EST RP Workstation: [id number]   DG Chest Portable 1 View Result Date: 12/13/2024 EXAM: 1 VIEW(S) XRAY OF THE CHEST 12/13/2024 09:41:00 AM COMPARISON: 10/06/2023 CLINICAL HISTORY: Cough. FINDINGS: LUNGS AND PLEURA: Small left pleural effusion. Left lower lobe airspace opacity, possibly intracardiac in location. No pneumothorax. HEART AND MEDIASTINUM: Cardiomegaly. Atherosclerotic calcifications. BONES AND SOFT TISSUES: No acute osseous abnormality. IMPRESSION: 1. Left retrocardiac airspace opacity, atelectasis versus pneumonia. 2. Small left pleural effusion. 3. Cardiomegaly. Electronically signed by: Katheleen Faes MD 12/13/2024 09:46 AM EST RP Workstation: [id number]     Assessment & Plan: James [name] is a 79 y.o.  male with , who was admitted to [facility] on 12/13/2024 for  Acute kidney injury on chronic kidney disease stage III A.  Baseline creatinine appears to be 1.4 with GFR 51 on 10/27/2024.  Acute kidney injury appears to be multifactorial from dehydration and infection.  Recent increase in metformin, 1500 mg daily, currently held. Creatinine up to 4.46. BUN also elevated 93. Discussed with patent and caregiver the likeliness of needing renal replacement therapy. They are hesitant and would ike to discuss amongst themselves. Will follow up in am.   2. Anemia of  chronic kidney disease Lab Results  Component Value Date   HGB 11.0 (L) 12/14/2024    Hgb acceptable for renal patient.   3. Acute metabolic acidosis, serum bicarb remains decreased. Was receiving sodium bicarb infusion during visit.   LOS: 1 Jakyra Kenealy 1/28/20264:32 PM      [1]  Allergies Allergen Reactions   Ramipril     Other reaction(s):   Hiccups   Empagliflozin Other (See Comments)    Other reaction(s): dysuria   "

## 2024-12-14 NOTE — Progress Notes (Signed)
 MD aware of increased HR. PRN metoprolol  given. No new orders.

## 2024-12-14 NOTE — Progress Notes (Signed)
 " Progress Note   Patient: James [name] FMW:[id number] DOB: 05/25/1946 DOA: 12/13/2024     1 DOS: the patient was seen and examined on 12/14/2024   Brief hospital course: James [name] is a 79 y.o. male with medical history significant of dementia, CAD STEMI status post PCI and stenting 2016, ischemic cardiomyopathy, chronic HFrEF with LVEF less than 30%, PAF on Eliquis ,  IIDM, brought in by family member for evaluation of worsening of confusion, poor oral intake.   Patient is confused and unable to provide any history, with history of provided by wife at bedside.  Patient started to deteriorated about 10 days ago, when he has had significant decrease of oral intake including meals and fluid, wife however has been able to feed him with his pills.  Last 3 days, patient has become more confused, and on average sleeps 14-15 hours a day, when he was awake he mainly stayed in the bed.  Wife found patient has significant decrease of urine output.  There was no nausea vomiting or diarrhea.  And patient has not been complaining of any pain.  Increasedly UF has been having difficulty to help patient to eat or go to bathroom and eventually she decided to bring him to the hospital.   ED Course:  Afebrile, tachycardia EKG showed uncontrolled A-fib, blood pressure 112/79 O2 saturation 100% on room air.  Chest x-ray suspicious for left lower lobe pneumonia, blood work showed AKI creatinine 93 BUN 4.6 glucose 322 bicarb 13K 5.8.   Patient was given ceftriaxone  doxycycline , IV bolus of 2000 mL normal saline and 1 dose of IV Lasix  in the ED.    Assessment and Plan:  Acute metabolic encephalopathy Alzheimer's dementia Patient with a history of Alzheimer's dementia admitted now for evaluation of worsening mental status in the setting of AKI most likely related to renal azotemia Patient remains confused and is only oriented to person, not to place or time Expect improvement in patient's mental status back to baseline  if renal function improves   AKI Renal azotemia Compensated combined metabolic acidosis and respiratory alkalosis Hyperkalemia - Clinically patient appeared to be volume depleted, mucous membranes were dry and likely secondary to poor oral intake for last 10 days, but patient also has signs of fluid overload with bilateral ankle edema as well as bilateral pleural effusion on CT scan Patient has a baseline serum creatinine of 0.94 from a year ago and on admission his serum creatinine was 4.60 There has not been any significant improvement in his renal function following hydration with bicarbonate infusion.  CT scan of the abdomen and pelvis without contrast showed no nephrolithiasis or ureterolithiasis. No obstructive uropathy. No hydronephrosis. No perinephric or periureteral stranding. Several small bladder diverticula are present, indicating bladder outlet obstruction. Patient had hyperkalemia on admission which has improved with bicarbonate infusion and IV Lasix . Nephrology consult due to worsening renal function      Acute on chronic HFrEF decompensated Acute hypoxic respiratory failure Possible cardiorenal syndrome Last 2D echocardiogram from 2023 showed an LVEF of 30% with grade 1 diastolic dysfunction Concern for possible cardiorenal syndrome due to worsening renal function associated with bilateral pleural effusions and evidence of fluid overload including bilateral pedal edema. Noted to have room air pulse oximetry of 88%  with associated tachypnea and is currently on 3 L to maintain pulse oximetry greater than 92% Unable to diurese patient due to relative hypotension Consult cardiology     NSTEMI ruled out Myocardial injury Patient has no chest pains,  EKG negative for acute ST changes. Suspect elevated troponin secondary to demand ischemia from AKI, rapid A-fib and CHF decompensation. Troponin is flat and not consistent with an NSTEMI Continue heparin  drip for 48 hours and  switch back to Eliquis      PAF with RVR Remains on an amiodarone  drip without any improvement in his heart rate Not a candidate for digoxin due to AKI Appreciate cardiology input    Sepsis, with acute endorgan damage from a urinary source As evidenced by lactic acidosis, leukocytosis, hypotension and source of sepsis which appears to be urinary Continue empiric antibiotic therapy with IV Rocephin  Follow-up results of urine culture Unable to continue IV fluid resuscitation in this patient due to acute systolic CHF Start patient on midodrine  to  improve blood pressure     IIDM Poorly controlled Hemoglobin A1c 9.0 Continue sliding scale insulin  Start patient on Lantus  10 units and monitor blood sugar closely     Dementia Memantine  and Aricept  remain on hold, due to acute mentation changes.          Subjective: Patient is awake, oriented only to person not to place or time.  Wife is at the bedside  Physical Exam: Vitals:   12/14/24 0743 12/14/24 0800 12/14/24 1159 12/14/24 1330  BP: 90/73  (!) 81/51 (!) 128/111  Pulse: (!) 58 (!) 110 (!) 124   Resp: 20  (!) 23   Temp: 98.9 F (37.2 C)  98.4 F (36.9 C)   TempSrc: Oral  Oral   SpO2: 100%  100%   Weight:      Height:       General Exam: Obese, sitting up in bed.  Appears comfortable in no obvious distress Eyes: Pale conjunctiva ENMT: Moist mucous membranes Neck: normal, supple, no masses, no thyromegaly Respiratory: Diminished breathing sound on bilateral lower fields, fine crackles on bilateral lower fields, increasing respiratory effort. No accessory muscle use.  Cardiovascular: Regular rate and rhythm, no murmurs / rubs / gallops.  2+ extremity edema. 2+ pedal pulses. No carotid bruits.  Abdomen: no tenderness, no masses palpated. No hepatosplenomegaly. Bowel sounds positive.  Musculoskeletal: no clubbing / cyanosis. No joint deformity upper and lower extremities. Good ROM, no contractures. Normal muscle tone.   Skin: no rashes, lesions, ulcers. No induration Neurologic: No facial droops, moving all limbs, oriented only to person not to place or time Psychiatric: Awake, confused   Data Reviewed: Labs reviewed.  BUN 93, creatinine 4.46, GFR 13, glucose 260, white count 12.0, hemoglobin A1c 9.0 Labs reviewed  Family Communication: Plan of care discussed with patient's wife at the bedside.  She understands that his overall prognosis is very poor and does not want any aggressive measures.  She would like him to receive medical treatment for the next 24 to 48 hours prior to making a decision regarding his care.  Disposition: Status is: Inpatient Remains inpatient appropriate because: On IV amiodarone   Planned Discharge Destination: TBD    Time spent: 60 minutes  Author: Aimee Somerset, MD 12/14/2024 2:26 PM  For on call review [email or web address].  "

## 2024-12-14 NOTE — Consult Note (Signed)
 PHARMACY - ANTICOAGULATION CONSULT NOTE  Pharmacy Consult for heparin  dosing Indication: NSTEMI  Allergies[1]  Patient Measurements: Height: 5' 11 (180.3 cm) Weight: 99.6 kg (219 lb 9.3 oz) IBW/kg (Calculated) : 75.3 HEPARIN  DW (KG): 95.8  Vital Signs: Temp: 97.8 F (36.6 C) (01/28 0402) Temp Source: Axillary (01/28 0402) BP: 119/93 (01/28 0433) Pulse Rate: 112 (01/28 0433)  Labs: Recent Labs    12/13/24 0812 12/13/24 0841 12/13/24 1035 12/13/24 1937 12/14/24 0436  HGB 11.4*  --   --   --  11.0*  HCT 37.3*  --   --   --  35.2*  PLT 329  --   --   --  210  APTT  --   --  32 87* 79*  LABPROT  --   --  21.8*  --   --   INR  --   --  1.8*  --   --   HEPARINUNFRC  --   --  >1.10*  --  >1.10*  CREATININE 4.60*  --   --  4.22*  --   CKTOTAL  --  1,534*  --   --   --     Estimated Creatinine Clearance: 17.3 mL/min (A) (by C-G formula based on SCr of 4.22 mg/dL (H)).   Medical History: Past Medical History:  Diagnosis Date   Asteatotic eczema    BPH (benign prostatic hyperplasia)    CAD (coronary artery disease)    Chronic cough    Chronic cough    Chronic diastolic CHF (congestive heart failure) (HCC)    Ejection fraction < 50%    Erectile dysfunction    Erectile dysfunction    Gait disorder    Gout    Grade I diastolic dysfunction    Heart attack (HCC) 2016   History of heart artery stent    History of ST elevation myocardial infarction (STEMI)    Hypertension associated with type 2 diabetes mellitus (HCC)    Hypogonadism in male    Ischemic cardiomyopathy    Low left ventricular ejection fraction    Mixed Alzheimer's and vascular dementia (HCC)    OSA on CPAP    Paroxysmal atrial fibrillation (HCC)    Sleep apnea    CPAP   Stasis dermatitis of both legs    Stroke (HCC) 2009   Peripheral vision impairment   Type 2 diabetes mellitus (HCC)    Type 2 diabetes mellitus with other specified complication (HCC)     Medications:  PTA apixaban  5mg  BID  (last dose 01/26 ~1800)  Assessment: James [name] is a 66 yoM presenting with concerns for NSTEMI. Past medical history is notable for CAD and ischemic cardiomyopathy, STEMI (2016), TIA, CHF, HLD, paroxysmal A-fib, DM, anticoagulated on Eliquis  (last dose 1/26 ~1800), and progressive dementia.  On arrival, unconfirmed EKG demonstrating atrial fibrillation and nonspecific T abnormalities. Troponin 693. CT chest has been ordered.  Baseline hgb 11.4, plt 329, INR 1.8, aPTT 32, and HL >1.10.  Goal of Therapy:  Heparin  level 0.3-0.7 units/ml aPTT 66-102 seconds Monitor platelets by anticoagulation protocol: Yes   01/27 1937 aPTT 87, therapeutic x 1 01/28 0436 aPTT 79,  HL > 1.10  Plan:  1/28 @ 0436:  aPTT = 79,  HL = > 1.10 - aPTT therapeutic X 2 , HL still elevated from Eliquis  PTA - will continue pt on current rate and recheck aPTT and HL on 1/29 with AM labs Adjust based on aPTT until correlation with Anti-Xa level CBC daily  while on heparin   Amand Lemoine D, PharmD 12/14/2024,5:45 AM        [1]  Allergies Allergen Reactions   Ramipril     Other reaction(s):   Hiccups   Empagliflozin Other (See Comments)    Other reaction(s): dysuria

## 2024-12-14 NOTE — Consult Note (Signed)
 " [facility] CARDIOLOGY CONSULT NOTE       Patient ID: James [name] MRN: [id number] DOB/AGE: 79-Apr-1947 56 y.o.  Admit date: 12/13/2024 Referring Physician Dr. Lanetta Primary Physician Lenon Layman ORN, MD  Primary Cardiologist Dr. Raina Pierre, PA Reason for Consultation AF RVR  HPI: James [name] is a 79 y.o. male  with a past medical history of coronary artery disease s/p DES to proximal and mid LAD 2016 with staged DES to RCA 2016, mild ischemic cardiomyopathy, hypertension, hyperlipidemia, type 2 diabetes, OSA on CPAP, history of dementia, paroxysmal atrial fibrillation on Eliquis  who presented to the ED on 12/13/2024 for confusion, poor intake.  Has been in A-fib RVR and overnight was started on IV amiodarone .  Cardiology was consulted for further evaluation.   Patient brought in by wife for evaluation of confusion, weakness, and poor PO intake. Workup in the ED notable for creatinine 4.6 (up from baseline 1.4 10/2024), potassium 5.8, hemoglobin 11.4, WBC 14.6. Troponins 693 > 668, BNP 24,052.  Lactic acid 7.2.  EKG in the ED atrial fibrillation rate 103 bpm.  CT chest with small to moderate bilateral pleural effusions, no evidence of pneumonia.  UA concerning for UTI.  Respiratory viral panel negative.  At the time of evaluation this morning, patient is resting comfortably in hospital bed with wife at bedside.  We discussed his symptoms in further detail.  Patient adamantly denies any chest pain, shortness of breath, palpitations, lightheadedness, dizziness, syncope.  His wife reports that he never has any complaints.  She states that recently they had noticed he had decreased p.o. intake and was having more trouble getting around.  Wife states that he was more weak than usual and also had an episode of diarrhea prior to admission.  She has noticed some shortness of breath recently.  Has a history of OSA and wears CPAP at home.  Also noticed more confusion than baseline.  Review  of systems complete and found to be negative unless listed above    Past Medical History:  Diagnosis Date   Asteatotic eczema    BPH (benign prostatic hyperplasia)    CAD (coronary artery disease)    Chronic cough    Chronic cough    Chronic diastolic CHF (congestive heart failure) (HCC)    Ejection fraction < 50%    Erectile dysfunction    Erectile dysfunction    Gait disorder    Gout    Grade I diastolic dysfunction    Heart attack (HCC) 2016   History of heart artery stent    History of ST elevation myocardial infarction (STEMI)    Hypertension associated with type 2 diabetes mellitus (HCC)    Hypogonadism in male    Ischemic cardiomyopathy    Low left ventricular ejection fraction    Mixed Alzheimer's and vascular dementia (HCC)    OSA on CPAP    Paroxysmal atrial fibrillation (HCC)    Sleep apnea    CPAP   Stasis dermatitis of both legs    Stroke (HCC) 2009   Peripheral vision impairment   Type 2 diabetes mellitus (HCC)    Type 2 diabetes mellitus with other specified complication [facility])     Past Surgical History:  Procedure Laterality Date   abdominal cyst surgery     CARDIAC CATHETERIZATION  2016   3 stents   CATARACT EXTRACTION W/PHACO Left 01/25/2024   Procedure: CATARACT EXTRACTION PHACO AND INTRAOCULAR LENS PLACEMENT (IOC) LEFT DIABETIC 7.63, 00:43.3;  Surgeon: Myrna Adine Anes, MD;  Location: [facility];  Service: Ophthalmology;  Laterality: Left;   RETINAL DETACHMENT SURGERY Right 07/22/2023   [facility]    Medications Prior to Admission  Medication Sig Dispense Refill Last Dose/Taking   allopurinol  (ZYLOPRIM ) 100 MG tablet Take 1 tablet by mouth daily.   12/12/2024 Evening   apixaban  (ELIQUIS ) 5 MG TABS tablet Take 5 mg by mouth 2 (two) times daily.   12/12/2024 Evening   aspirin  81 MG tablet Take 81 mg by mouth daily.   12/12/2024 Morning   atorvastatin  (LIPITOR ) 80 MG tablet Take 1 tablet by mouth.   12/12/2024 Morning   buPROPion  (WELLBUTRIN  XL) 150  MG 24 hr tablet Take 150 mg by mouth daily.   12/12/2024 Morning   cyanocobalamin (VITAMIN B12) 1000 MCG tablet Take 1,000 mcg by mouth daily.   12/12/2024 Evening   donepezil  (ARICEPT ) 10 MG tablet TAKE 1 TABLET BY MOUTH EVERY DAY AT NIGHT  2 12/12/2024 Evening   metFORMIN (GLUCOPHAGE-XR) 500 MG 24 hr tablet Take 1,500 mg by mouth daily.   12/12/2024 Evening   metoprolol  succinate (TOPROL -XL) 25 MG 24 hr tablet Take 25 mg by mouth daily.   12/12/2024 Morning   POTASSIUM CHLORIDE PO Take 10 mEq by mouth daily.   12/12/2024 Morning   torsemide (DEMADEX) 20 MG tablet Take 20 mg by mouth daily.   12/12/2024 Morning   Vitamin D, Cholecalciferol, 1000 UNITS CAPS Take by mouth daily.   12/12/2024 Morning   colchicine 0.6 MG tablet Take 2 tablets (1.2mg ) by mouth at first sign of gout flare followed by 1 tablet (0.6mg ) after 1 hour. (Max 1.8mg  within 1 hour) (Patient not taking: Reported on 01/14/2024)   Unknown   memantine  (NAMENDA ) 10 MG tablet Take 10 mg by mouth 2 (two) times daily. (Patient not taking: Reported on 12/13/2024)   Not Taking   neomycin-polymyxin b-dexamethasone (MAXITROL) 3.5-10000-0.1 OINT Place 1 Application into the right eye 4 (four) times daily. (Patient not taking: Reported on 12/13/2024)   Not Taking   nitroGLYCERIN (NITROSTAT) 0.4 MG SL tablet Place under the tongue as needed.   Unknown   ofloxacin (OCUFLOX) 0.3 % ophthalmic solution Place 1 drop into the right eye 4 (four) times daily. (Patient not taking: Reported on 12/13/2024)   Not Taking   prednisoLONE acetate (PRED FORTE) 1 % ophthalmic suspension Place 1 drop into the right eye 4 (four) times daily. (Patient not taking: Reported on 12/13/2024)   Not Taking   sertraline  (ZOLOFT ) 50 MG tablet Take 1 tablet by mouth daily. (Patient not taking: Reported on 12/13/2024)   Not Taking   Social History   Socioeconomic History   Marital status: Married    Spouse name: Not on file   Number of children: Not on file   Years of education: Not  on file   Highest education level: Not on file  Occupational History   Not on file  Tobacco Use   Smoking status: Former    Current packs/day: 0.00    Average packs/day: 1.5 packs/day for 25.0 years (37.5 ttl pk-yrs)    Types: Cigarettes    Start date: 11/18/1959    Quit date: 11/17/1984    Years since quitting: 40.1   Smokeless tobacco: Never   Tobacco comments:    years ago  Vaping Use   Vaping status: Never Used  Substance and Sexual Activity   Alcohol use: No    Alcohol/week: 0.0 standard drinks of alcohol   Drug use: No   Sexual activity:  Not on file  Other Topics Concern   Not on file  Social History Narrative   Not on file   Social Drivers of Health   Tobacco Use: Medium Risk (10/31/2024)   Received from [facility]   Patient History    Smoking Tobacco Use: Former    Smokeless Tobacco Use: Never    Passive Exposure: Not on file  Financial Resource Strain: Low Risk  (09/07/2024)   Received from [facility]   Overall Financial Resource Strain (CARDIA)    Difficulty of Paying Living Expenses: Not hard at all  Food Insecurity: No Food Insecurity (09/07/2024)   Received from [facility]   Epic    Within the past 12 months, you worried that your food would run out before you got the money to buy more.: Never true    Within the past 12 months, the food you bought just didn't last and you didn't have money to get more.: Never true  Transportation Needs: No Transportation Needs (09/07/2024)   Received from [facility] - Transportation    In the past 12 months, has lack of transportation kept you from medical appointments or from getting medications?: No    Lack of Transportation (Non-Medical): No  Physical Activity: Not on file  Stress: Not on file  Social Connections: Not on file  Intimate Partner Violence: Not on file  Depression (EYV7-0): Not on file  Alcohol Screen: Not on file   Housing: Low Risk  (09/07/2024)   Received from [facility]   Epic    In the last 12 months, was there a time when you were not able to pay the mortgage or rent on time?: No    In the past 12 months, how many times have you moved where you were living?: 0    At any time in the past 12 months, were you homeless or living in a shelter (including now)?: No  Utilities: Not At Risk (09/07/2024)   Received from [facility]   Epic    In the past 12 months has the electric, gas, oil, or water company threatened to shut off services in your home?: No  Health Literacy: Not on file    Family History  Problem Relation Age of Onset   Kidney disease Neg Hx    Prostate cancer Neg Hx    Bladder Cancer Neg Hx    Kidney cancer Neg Hx      Vitals:   12/14/24 0402 12/14/24 0433 12/14/24 0743 12/14/24 0800  BP: (!) 81/62 (!) 119/93 90/73   Pulse: (!) 110 (!) 112 (!) 58 (!) 110  Resp: 18  20   Temp: 97.8 F (36.6 C)  98.9 F (37.2 C)   TempSrc: Axillary  Oral   SpO2: 98%  100%   Weight:      Height:        PHYSICAL EXAM General: Chronically ill-appearing elderly male, well nourished, in no acute distress. HEENT: Normocephalic and atraumatic. Neck: No JVD.  Lungs: Normal respiratory effort on 2L [place].  Diminished bilaterally Heart: Irregularly irregular, elevated rate. Normal S1 and S2 without gallops or murmurs.  Abdomen: Non-distended appearing.  Msk: Normal strength and tone for age. Extremities: Warm and well perfused. No clubbing, cyanosis.  Trace edema.  Neuro: Alert and oriented X 3. Psych: Answers questions appropriately.   Labs: Basic Metabolic Panel: Recent Labs    12/13/24 0841 12/13/24  1937 12/14/24 0436  NA  --  136 136  K  --  4.7 5.0  CL  --  101 100  CO2  --  16* 13*  GLUCOSE  --  154* 260*  BUN  --  90* 93*  CREATININE  --  4.22* 4.46*  CALCIUM   --  8.4* 8.4*  MG 2.3  --   --   PHOS 7.5*  --   --    Liver Function  Tests: Recent Labs    12/13/24 0812  AST 51*  ALT 22  ALKPHOS 120  BILITOT 0.9  PROT 6.6  ALBUMIN 3.8   No results for input(s): LIPASE, AMYLASE in the last 72 hours. CBC: Recent Labs    12/13/24 0812 12/14/24 0436  WBC 14.6* 12.0*  NEUTROABS 12.4*  --   HGB 11.4* 11.0*  HCT 37.3* 35.2*  MCV 86.1 83.2  PLT 329 210   Cardiac Enzymes: Recent Labs    12/13/24 0841  CKTOTAL 1,534*   BNP: No results for input(s): BNP in the last 72 hours. D-Dimer: No results for input(s): DDIMER in the last 72 hours. Hemoglobin A1C: Recent Labs    12/13/24 1638  HGBA1C 9.0*   Fasting Lipid Panel: No results for input(s): CHOL, HDL, LDLCALC, TRIG, CHOLHDL, LDLDIRECT in the last 72 hours. Thyroid Function Tests: No results for input(s): TSH, T4TOTAL, T3FREE, THYROIDAB in the last 72 hours.  Invalid input(s): FREET3 Anemia Panel: No results for input(s): VITAMINB12, FOLATE, FERRITIN, TIBC, IRON, RETICCTPCT in the last 72 hours.   Radiology: DG Chest 1 View Result Date: 12/14/2024 EXAM: 1 VIEW(S) XRAY OF THE CHEST 12/14/2024 07:07:00 AM COMPARISON: 12/13/2024 CLINICAL HISTORY: Congestive heart failure. FINDINGS: LUNGS AND PLEURA: Stable small left pleural effusion and left basilar opacity. No pneumothorax. HEART AND MEDIASTINUM: Cardiomegaly, unchanged. Atherosclerotic calcifications. BONES AND SOFT TISSUES: No acute osseous abnormality. IMPRESSION: 1. Stable small left pleural effusion and left basilar opacity. 2. Cardiomegaly, unchanged. Electronically signed by: Waddell Calk MD 12/14/2024 07:10 AM EST RP Workstation: [id number]   DG Chest 1 View Result Date: 12/13/2024 EXAM: 1 VIEW(S) XRAY OF THE CHEST 12/13/2024 06:08:00 PM COMPARISON: 12/13/2024 CLINICAL HISTORY: Congestive heart failure. ICD10: [id number] Congestive heart failure. FINDINGS: LUNGS AND PLEURA: Small left pleural effusion. Similar left retrocardiac opacity. No pneumothorax. HEART  AND MEDIASTINUM: Cardiomegaly. BONES AND SOFT TISSUES: No acute osseous abnormality. IMPRESSION: 1. Small left pleural effusion and similar left retrocardiac opacity. No change from the prior exam from earlier in the same day. Electronically signed by: Oneil Devonshire MD 12/13/2024 11:44 PM EST RP Workstation: [id number]   CT CHEST WO CONTRAST Result Date: 12/13/2024 EXAM: CT CHEST WITHOUT CONTRAST 12/13/2024 11:36:35 AM TECHNIQUE: CT of the chest was performed without the administration of intravenous contrast. Multiplanar reformatted images are provided for review. Automated exposure control, iterative reconstruction, and/or weight based adjustment of the mA/kV was utilized to reduce the radiation dose to as low as reasonably achievable. COMPARISON: None available. CLINICAL HISTORY: Pneumonia, complication suspected, x-ray done. FINDINGS: MEDIASTINUM: Heart: Mild cardiac enlargement. 3-vessel coronary artery calcifications. Vessels: The main pulmonary artery is increased in caliber, measuring 4 cm. Aortic atherosclerosis. Airways: The central airways are clear. LYMPH NODES: No enlarged mediastinal or axillary lymph nodes. LUNGS AND PLEURA: Pleura: Small-to-moderate bilateral pleural effusions are identified, left greater than right. No pneumothorax. Lungs: No airspace consolidation to suggest pneumonia. Mild dependent ground-glass attenuation is noted within the posterior upper and lower lung zones. Small calcified granuloma identified within the left lower lobe. SOFT  TISSUES/BONES: No acute abnormality of the bones or soft tissues. UPPER ABDOMEN: Calcification identified in the liver. No acute abnormality noted within the imaged portions of the upper abdomen. IMPRESSION: 1. No CT evidence of pneumonia. 2. Small-to-moderate bilateral pleural effusions, left greater than right. 3. Increased caliber of the main pulmonary artery compatible with pulmonary artery hypertension. 4. Aortic atherosclerosis and coronary  artery calcification. Electronically signed by: Waddell Calk MD 12/13/2024 12:01 PM EST RP Workstation: [id number]   CT ABDOMEN PELVIS WO CONTRAST Result Date: 12/13/2024 EXAM: CT ABDOMEN AND PELVIS WITHOUT CONTRAST 12/13/2024 10:07:26 AM TECHNIQUE: CT of the abdomen and pelvis was performed without the administration of intravenous contrast. Multiplanar reformatted images are provided for review. Automated exposure control, iterative reconstruction, and/or weight-based adjustment of the mA/kV was utilized to reduce the radiation dose to as low as reasonably achievable. COMPARISON: None available. CLINICAL HISTORY: Acute renal failure, right-sided abdominal tenderness. Evaluate ureteral obstruction. FINDINGS: LOWER CHEST: Bilateral layering pleural effusions. LIVER: The liver is unremarkable. GALLBLADDER AND BILE DUCTS: Gallbladder is unremarkable. No biliary ductal dilatation. SPLEEN: No acute abnormality. PANCREAS: No acute abnormality. ADRENAL GLANDS: A non left adrenal adenoma. KIDNEYS, URETERS AND BLADDER: No nephrolithiasis or ureterolithiasis. No obstructive uropathy. No hydronephrosis. No perinephric or periureteral stranding. Several small bladder diverticula are present, indicating bladder outlet obstruction. GI AND BOWEL: Stomach demonstrates no acute abnormality. There is no bowel obstruction. Appendix is normal. PERITONEUM AND RETROPERITONEUM: No ascites. No free air. VASCULATURE: Aorta is normal in caliber. LYMPH NODES: No lymphadenopathy. REPRODUCTIVE ORGANS: No acute enlargement of the posterior gland 10 mm. BONES AND SOFT TISSUES: No acute osseous abnormality. No focal soft tissue abnormality. IMPRESSION: 1. No nephrolithiasis, ureterolithiasis, or obstructive uropathy. 2. Several small bladder diverticula, potential partial bladder outlet obstruction. 3. No acute findings in the abdomen or pelvis, with normal appendix. 4. Bilateral pleural effusions. Electronically signed by: Norleen Boxer MD  12/13/2024 11:28 AM EST RP Workstation: [id number]   DG Chest Portable 1 View Result Date: 12/13/2024 EXAM: 1 VIEW(S) XRAY OF THE CHEST 12/13/2024 09:41:00 AM COMPARISON: 10/06/2023 CLINICAL HISTORY: Cough. FINDINGS: LUNGS AND PLEURA: Small left pleural effusion. Left lower lobe airspace opacity, possibly intracardiac in location. No pneumothorax. HEART AND MEDIASTINUM: Cardiomegaly. Atherosclerotic calcifications. BONES AND SOFT TISSUES: No acute osseous abnormality. IMPRESSION: 1. Left retrocardiac airspace opacity, atelectasis versus pneumonia. 2. Small left pleural effusion. 3. Cardiomegaly. Electronically signed by: Katheleen Faes MD 12/13/2024 09:46 AM EST RP Workstation: [id number]    ECHO ordered  TELEMETRY (personally reviewed): Atrial fibrillation rate 110-120s  EKG (personally reviewed): Atrial fibrillation rate 103 bpm  Data reviewed by me 12/14/2024: last 24h vitals tele labs imaging I/O ED provider note, admission H&P  Principal Problem:   AKI (acute kidney injury) Active Problems:   Acute metabolic encephalopathy   A-fib (HCC)    ASSESSMENT AND PLAN:  James [name] is a 79 y.o. male  with a past medical history of coronary artery disease s/p DES to proximal and mid LAD 2016 with staged DES to RCA 2016, mild ischemic cardiomyopathy, hypertension, hyperlipidemia, type 2 diabetes, OSA on CPAP, history of dementia, paroxysmal atrial fibrillation on Eliquis  who presented to the ED on 12/13/2024 for confusion, poor intake.  Has been in A-fib RVR and overnight was started on IV amiodarone .  Cardiology was consulted for further evaluation.   # Atrial fibrillation RVR # ?Acute on chronic HFrEF # AKI on CKD # Hypotension # Coronary artery disease Patient presented with confusion, decreased intake. Denies any CP, SOB, palpitations. CT with  pleural effusions. Developed AF RVR overnight and started on IV amiodarone . Cr up to 4.6 from baseline 1.4.  -Continue IV amiodarone . Additional  options for rate/rhythm control limited given BP, renal function.  -Continue IV heparin  for anticoagulation.  -Discussed with patients wife that he is quite ill with multiple comorbidities. She expressed understanding and is having continued discussions with palliative care regarding GOC. Prognosis is poor. -Mild troponin elevation most consistent with demand/supply mismatch and not ACS    This patient's plan of care was discussed and created with Dr. Wilburn and he is in agreement.  Signed: Danita Bloch, PA-C  12/14/2024, 9:40 AM [facility] Card[facility]      "

## 2024-12-14 NOTE — Progress Notes (Signed)
 Patient remains on RED MEWS, tachypneic and sinus tachycardia noted with HR ranging 120'150's. Rockie Rams NP made aware; metoprolol  tartrate 12.5mg  PO ordered and administered . One hour post-administration, Heart rate remained labile, ranging 120's 160's, patient asymptomatic. BP 91/74 map 81 MD notified. Amiodarone  infusion initiated per ordered. IV fluids restarted.

## 2024-12-14 NOTE — Progress Notes (Signed)
 "                                                                                                                                                                                                          Daily Progress Note   Patient Name: James [name]       Date: 12/14/2024 DOB: 01-Apr-1946  Age: 79 y.o. MRN#: [id number] Attending Physician: Lanetta Lingo, MD Primary Care Physician: Lenon Layman ORN, MD Admit Date: 12/13/2024  Reason for Consultation/Follow-up: Establishing goals of care  Subjective: Notes and labs reviewed.  In to see patient.  He is currently resting in bed at this time with wife at bedside.  He appears restless and uncomfortable.  Upon asking patient how he is doing he holds his arm up and gives me a thumbs down.  He states he is not feeling well.  Inquired if he was having pain, shortness of breath, nausea or vomiting, he denies symptoms and states he just does not feel well, and he is tired.  She states he has voiced for a while that he is tired.  Inquired if he wanted to continue the current care being provided in the hospital and he held his hand down in front of him, palm down and moved his hand from side-to-side, and said no.  He did not speak further.  Wife discusses that it is common for him to change his mind frequently.  She discusses that he looks to her to make healthcare decisions, and will look to her to make decisions moving forward.  We discuss his current status and concerns for prognosis.  She discusses that she knows his time is limited.  She states she would like to continue workup and treatment as per primary team and specialty services.    She discusses that she has been keeping their son and patient's brother updated. She discusses that she is a agricultural consultant for [employer], and begins to share concerns regarding hospice at home versus hospice facility, and care that he would require after discharge. She states their son lives in [place] . She  discusses that she is unsure if patient would be accepting of the word hospice upon discharge, and she discusses having had patient's that were not.   Discussed speaking with patient further to discern his current wishes.  She states she is aware of his wishes.  She voices concern that she does not want conversations with PMT to interfere with the workup being provided by the attending and specialty teams.  Discussed that at this time, patient appears to  be a candidate for the hospice facility should she choose to shift to comfort care.  She thanked me for the information, and would prefer to continue working with attending team and specialty services.  Nephrology in to evaluate.  Length of Stay: 1  Current Medications: Scheduled Meds:   allopurinol   100 mg Oral Daily   aspirin   81 mg Oral Daily   buPROPion   150 mg Oral Daily   Chlorhexidine  Gluconate Cloth  6 each Topical Daily   insulin  aspart  0-9 Units Subcutaneous TID WC   midodrine   10 mg Oral TID WC   tamsulosin   0.4 mg Oral QPC supper    Continuous Infusions:  amiodarone  30 mg/hr (12/14/24 0525)   cefTRIAXone  (ROCEPHIN )  IV 2 g (12/14/24 9161)   heparin  1,200 Units/hr (12/14/24 9386)    PRN Meds: acetaminophen  **OR** acetaminophen , HYDROmorphone  (DILAUDID ) injection, metoprolol  tartrate, ondansetron  **OR** ondansetron  (ZOFRAN ) IV, traZODone   Physical Exam Pulmonary:     Effort: Pulmonary effort is normal.  Skin:    General: Skin is warm and dry.  Neurological:     Mental Status: He is alert.             Vital Signs: BP (!) 81/51 (BP Location: Left Arm)   Pulse (!) 124   Temp 98.4 F (36.9 C) (Oral)   Resp (!) 23   Ht 5' 11 (1.803 m)   Wt 99.6 kg   SpO2 100%   BMI 30.62 kg/m  SpO2: SpO2: 100 % O2 Device: O2 Device: Nasal Cannula O2 Flow Rate: O2 Flow Rate (L/min): 2 L/min  Intake/output summary:  Intake/Output Summary (Last 24 hours) at 12/14/2024 1224 Last data filed at 12/14/2024 1003 Gross per 24 hour   Intake 3514.63 ml  Output 1350 ml  Net 2164.63 ml   LBM: Last BM Date :  (PTA) Baseline Weight: Weight: 99.6 kg Most recent weight: Weight: 99.6 kg    Patient Active Problem List   Diagnosis Date Noted   AKI (acute kidney injury) 12/13/2024   Acute metabolic encephalopathy 12/13/2024   A-fib (HCC) 12/13/2024   Gait disorder 02/24/2020   Asteatotic eczema 09/20/2019   Stasis dermatitis of both legs 09/20/2019   Ischemic cardiomyopathy 08/19/2019   Mixed Alzheimer's and vascular dementia (HCC) 05/06/2018   BMI 40.0-44.9, adult (HCC) 08/14/2017   Severe obesity (BMI 35.0-39.9) with comorbidity (HCC) 08/14/2017   Benign fibroma of prostate 10/24/2015   Gout 10/24/2015   Acute myocardial infarction of anterior wall (HCC) 10/10/2015   Acute blood loss anemia 09/22/2015   Ecchymosis 09/22/2015   Acute systolic heart failure (HCC) 09/21/2015   CAD in native artery 09/21/2015   Frank hematuria 09/21/2015   ST elevation myocardial infarction (STEMI) (HCC) 09/21/2015   Avitaminosis D 08/15/2015   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   Other long term (current) drug therapy 02/20/2015   Mild cognitive disorder 01/25/2015   Mild cognitive impairment 01/25/2015   Chest pain 09/20/2014   Breathlessness on exertion 09/20/2014   H/O transient cerebral ischemia 09/20/2014   B12 deficiency 08/13/2014   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 PMT will shadow.  Please reach out for immediate needs. Attending updated on conversation.  Code Status:    Code Status Orders  (From admission, onward)           Start     Ordered   12/13/24 1103  Do not attempt resuscitation (DNR)- Limited -Do Not Intubate (DNI)  Continuous       Question Answer Comment  If pulseless and not breathing No CPR or chest compressions.   In Pre-Arrest Conditions (Patient Is Breathing and Has A Pulse) Do not intubate. Provide all appropriate non-invasive medical interventions. Avoid ICU transfer unless indicated or required.   Consent: Discussion documented in EHR or advanced directives reviewed      12/13/24 1104           Code Status History     Date Active Date Inactive Code Status Order ID Comments User Context   12/13/2024 1026 12/13/2024 1104 Limited: Do not attempt resuscitation (DNR) -DNR-LIMITED -Do Not Intubate/DNI  [id number]  Claudene Rover, MD ED      Advance Directive Documentation    Flowsheet Row Most Recent Value  Type of Advance Directive Out of facility DNR (pink MOST or yellow form)  Pre-existing out of facility DNR order (yellow form or pink MOST form) --  MOST Form in Place? --    Thank you for allowing the Palliative Medicine Team to assist in the care of this patient.   Time In: 10:40 Time Out: 11:30 Total Time 50 min Prolonged Time Billed  no       Greater than 50%  of this time was spent counseling and coordinating care related to the above assessment and plan.  Camelia Lewis, NP  Please contact Palliative Medicine Team phone at [phone] for questions and concerns.       "

## 2024-12-15 DIAGNOSIS — N179 Acute kidney failure, unspecified: Secondary | ICD-10-CM | POA: Diagnosis not present

## 2024-12-15 LAB — CBC
HCT: 35.2 % — ABNORMAL LOW (ref 39.0–52.0)
Hemoglobin: 11.2 g/dL — ABNORMAL LOW (ref 13.0–17.0)
MCH: 26.5 pg (ref 26.0–34.0)
MCHC: 31.8 g/dL (ref 30.0–36.0)
MCV: 83.2 fL (ref 80.0–100.0)
Platelets: 181 10*3/uL (ref 150–400)
RBC: 4.23 MIL/uL (ref 4.22–5.81)
RDW: 15.7 % — ABNORMAL HIGH (ref 11.5–15.5)
WBC: 12.1 10*3/uL — ABNORMAL HIGH (ref 4.0–10.5)
nRBC: 0.2 % (ref 0.0–0.2)

## 2024-12-15 LAB — ECHOCARDIOGRAM COMPLETE
Est EF: <20
Height: 71 in
S' Lateral: 6.1 cm
Weight: 3513.25 [oz_av]

## 2024-12-15 LAB — GLUCOSE, CAPILLARY
Glucose-Capillary: 208 mg/dL — ABNORMAL HIGH (ref 70–99)
Glucose-Capillary: 210 mg/dL — ABNORMAL HIGH (ref 70–99)
Glucose-Capillary: 185 mg/dL — ABNORMAL HIGH (ref 70–99)
Glucose-Capillary: 176 mg/dL — ABNORMAL HIGH (ref 70–99)

## 2024-12-15 LAB — HEPARIN LEVEL (UNFRACTIONATED): Heparin Unfractionated: >1.1 [IU]/mL — ABNORMAL HIGH (ref 0.30–0.70)

## 2024-12-15 LAB — URINE CULTURE: Culture: >=100000 — AB

## 2024-12-15 LAB — BASIC METABOLIC PANEL WITH GFR
Anion gap: 24 — ABNORMAL HIGH (ref 5–15)
BUN: 102 mg/dL — ABNORMAL HIGH (ref 8–23)
CO2: 13 mmol/L — ABNORMAL LOW (ref 22–32)
Calcium: 8.2 mg/dL — ABNORMAL LOW (ref 8.9–10.3)
Chloride: 98 mmol/L (ref 98–111)
Creatinine, Ser: 5.3 mg/dL — ABNORMAL HIGH (ref 0.61–1.24)
GFR, Estimated: 10 mL/min — ABNORMAL LOW
Glucose, Bld: 222 mg/dL — ABNORMAL HIGH (ref 70–99)
Potassium: 4.7 mmol/L (ref 3.5–5.1)
Sodium: 135 mmol/L (ref 135–145)

## 2024-12-15 LAB — APTT: aPTT: 87 s — ABNORMAL HIGH (ref 24–36)

## 2024-12-15 MED ORDER — SENNOSIDES-DOCUSATE SODIUM 8.6-50 MG PO TABS
2.0000 | ORAL_TABLET | Freq: Every day | ORAL | Status: DC
  Administered 2024-12-15: 2 via ORAL
  Filled 2024-12-15: qty 2

## 2024-12-15 MED ORDER — SODIUM CHLORIDE 0.9 % IV SOLN
1.0000 g | INTRAVENOUS | Status: DC
  Administered 2024-12-15 – 2024-12-16 (×2): 1 g via INTRAVENOUS
  Filled 2024-12-15 (×2): qty 10

## 2024-12-15 MED ORDER — ALBUTEROL SULFATE HFA 108 (90 BASE) MCG/ACT IN AERS
2.0000 | INHALATION_SPRAY | RESPIRATORY_TRACT | Status: DC | PRN
  Administered 2024-12-15: 2 via RESPIRATORY_TRACT
  Filled 2024-12-15: qty 6.7

## 2024-12-15 MED ORDER — ALBUTEROL SULFATE (2.5 MG/3ML) 0.083% IN NEBU: 2.5000 mg | INHALATION_SOLUTION | RESPIRATORY_TRACT | Status: DC | PRN

## 2024-12-15 MED ORDER — POLYETHYLENE GLYCOL 3350 17 G PO PACK
17.0000 g | PACK | Freq: Every day | ORAL | Status: DC
  Administered 2024-12-15 – 2024-12-16 (×2): 17 g via ORAL
  Filled 2024-12-15 (×2): qty 1

## 2024-12-15 MED ORDER — APIXABAN 5 MG PO TABS
5.0000 mg | ORAL_TABLET | Freq: Two times a day (BID) | ORAL | Status: DC
  Administered 2024-12-15 – 2024-12-16 (×3): 5 mg via ORAL
  Filled 2024-12-15 (×3): qty 1

## 2024-12-15 NOTE — Consult Note (Signed)
 Consultation Note Date: 12/13/2024    Patient Name: James [name]  DOB:   05-08-1946          MRN:   [id number]      Age / Sex:       79 y.o., male  PCP: Lenon Layman ORN, MD Referring Physician: Laurita Cort DASEN, MD   Reason for Consultation: Establishing goals of care   HPI/Patient Profile: James [name] is a 79 y.o. male with medical history significant of dementia, CAD STEMI status post PCI and stenting 2016, ischemic cardiomyopathy, chronic HFrEF with LVEF less than 30%, PAF on Eliquis ,  IIDM, brought in by family member for evaluation of worsening of confusion, poor oral intake.    Clinical Assessment and Goals of Care: Notes and labs reviewed.  In to see patient.  He is currently resting in bed at this time with his wife at bedside.  Wife advises she is a agricultural consultant for [employer].  Patient does not engage and answer questions.   She discusses that at baseline patient enjoys watching westerns, but does not talk as much as he used to.  She states he does not initiate conversation, but many times he will repeat a comment.  She states he has been using depends for about the past year but will still try to go to the bathroom.  She states she helps him with hygiene.  She states sometimes he sits in the chair on the toilet for 30 minutes to an hour and appears lost.  She states his appetite has been decreasing and he has been sleeping more.   We discussed his diagnoses, prognosis, GOC, EOL wishes disposition and options.   A detailed discussion was had today regarding advanced directives.  Concepts specific to code status, artifical feeding and hydration, IV antibiotics and rehospitalization were discussed.  The difference between an aggressive medical intervention path and a comfort care path was discussed.  Values and goals of care important to patient and family were attempted to be elicited.   Discussed limitations of medical interventions to prolong quality of life in some situations and  discussed the concept of human mortality.   She has been updated and is able to articulate his status.  She states she is aware that her husband may be ready for hospice level care, but would like a couple of days to see how he does.   She confirms DNR/DNI.     SUMMARY OF RECOMMENDATIONS   PMT will follow.  Time for outcomes.            Primary Diagnoses: Present on Admission: **None**     I have reviewed the medical record, interviewed the patient and family, and examined the patient. The following aspects are pertinent.       Past Medical History:  Diagnosis Date   Asteatotic eczema     BPH (benign prostatic hyperplasia)     CAD (coronary artery disease)     Chronic cough     Chronic cough     Chronic diastolic CHF (congestive heart failure) (HCC)     Ejection fraction < 50%     Erectile dysfunction     Erectile dysfunction     Gait disorder     Gout     Grade I diastolic dysfunction     Heart attack (HCC) 2016   History of heart artery stent     History of ST elevation myocardial infarction (STEMI)     Hypertension associated with type 2 diabetes  mellitus (HCC)     Hypogonadism in male     Ischemic cardiomyopathy     Low left ventricular ejection fraction     Mixed Alzheimer's and vascular dementia (HCC)     OSA on CPAP     Paroxysmal atrial fibrillation (HCC)     Sleep apnea      CPAP   Stasis dermatitis of both legs     Stroke (HCC) 2009    Peripheral vision impairment   Type 2 diabetes mellitus (HCC)     Type 2 diabetes mellitus with other specified complication (HCC)          Social History         Socioeconomic History   Marital status: Married      Spouse name: Not on file   Number of children: Not on file   Years of education: Not on file   Highest education level: Not on file  Occupational History   Not on file  Tobacco Use   Smoking status: Former      Current packs/day: 0.00      Average packs/day: 1.5 packs/day for 25.0 years (37.5  ttl pk-yrs)      Types: Cigarettes      Start date: 11/18/1959      Quit date: 11/17/1984      Years since quitting: 40.0   Smokeless tobacco: Never   Tobacco comments:      years ago  Vaping Use   Vaping status: Never Used  Substance and Sexual Activity   Alcohol use: No      Alcohol/week: 0.0 standard drinks of alcohol   Drug use: No   Sexual activity: Not on file  Other Topics Concern   Not on file  Social History Narrative   Not on file    Social Drivers of Health        Tobacco Use: Medium Risk (10/31/2024)    Received from [facility]    Patient History     Smoking Tobacco Use: Former     Smokeless Tobacco Use: Never     Passive Exposure: Not on file  Financial Resource Strain: Low Risk  (09/07/2024)    Received from [facility]    Overall Financial Resource Strain (CARDIA)     Difficulty of Paying Living Expenses: Not hard at all  Food Insecurity: No Food Insecurity (09/07/2024)    Received from [facility]    Epic     Within the past 12 months, you worried that your food would run out before you got the money to buy more.: Never true     Within the past 12 months, the food you bought just didn't last and you didn't have money to get more.: Never true  Transportation Needs: No Transportation Needs (09/07/2024)    Received from [facility]     In the past 12 months, has lack of transportation kept you from medical appointments or from getting medications?: No     Lack of Transportation (Non-Medical): No  Physical Activity: Not on file  Stress: Not on file  Social Connections: Not on file  Depression (EYV7-0): Not on file  Alcohol Screen: Not on file  Housing: Low Risk  (09/07/2024)    Received from [facility]    Epic     In the last 12 months, was there a time when you were not able to pay  the mortgage or rent on time?: No     In the past 12  months, how many times have you moved where you were living?: 0     At any time in the past 12 months, were you homeless or living in a shelter (including now)?: No  Utilities: Not At Risk (09/07/2024)    Received from [facility]    Epic     In the past 12 months has the electric, gas, oil, or water company threatened to shut off services in your home?: No  Health Literacy: Not on file         Family History  Problem Relation Age of Onset   Kidney disease Neg Hx     Prostate cancer Neg Hx     Bladder Cancer Neg Hx     Kidney cancer Neg Hx          Scheduled Meds:  allopurinol   100 mg Oral Daily   aspirin   81 mg Oral Daily   atorvastatin   80 mg Oral Daily   buPROPion   150 mg Oral Daily   Chlorhexidine  Gluconate Cloth  6 each Topical Daily   insulin  aspart  0-9 Units Subcutaneous TID WC   tamsulosin   0.4 mg Oral QPC supper        Continuous Infusions:  [START ON 12/14/2024] cefTRIAXone  (ROCEPHIN )  IV     heparin  1,200 Units/hr (12/13/24 1203)   sodium bicarbonate  75 mEq in sodium chloride  0.45 % 1,075 mL infusion          PRN Meds:. acetaminophen  **OR** acetaminophen , HYDROmorphone  (DILAUDID ) injection, metoprolol  tartrate, ondansetron  **OR** ondansetron  (ZOFRAN ) IV, traZODone      Medications Prior to Admission:         Prior to Admission medications  Medication Sig Start Date End Date Taking? Authorizing Provider  allopurinol  (ZYLOPRIM ) 100 MG tablet Take 1 tablet by mouth daily. 02/24/20   Yes [provider]  apixaban  (ELIQUIS ) 5 MG TABS tablet Take 5 mg by mouth 2 (two) times daily.     Yes [provider]  aspirin  81 MG tablet Take 81 mg by mouth daily.     Yes [provider]  atorvastatin  (LIPITOR ) 80 MG tablet Take 1 tablet by mouth. 10/04/15   Yes [provider]  buPROPion  (WELLBUTRIN  XL) 150 MG 24 hr tablet Take 150 mg by mouth daily.     Yes [provider]  cyanocobalamin (VITAMIN B12) 1000 MCG  tablet Take 1,000 mcg by mouth daily.     Yes [provider]  donepezil  (ARICEPT ) 10 MG tablet TAKE 1 TABLET BY MOUTH EVERY DAY AT NIGHT 07/12/18   Yes [provider]  metFORMIN (GLUCOPHAGE-XR) 500 MG 24 hr tablet Take 1,500 mg by mouth daily. 06/01/23   Yes [provider]  metoprolol  succinate (TOPROL -XL) 25 MG 24 hr tablet Take 25 mg by mouth daily. 09/22/15   Yes [provider]  POTASSIUM CHLORIDE PO Take 10 mEq by mouth daily.     Yes [provider]  torsemide (DEMADEX) 20 MG tablet Take 20 mg by mouth daily.     Yes [provider]  Vitamin D, Cholecalciferol, 1000 UNITS CAPS Take by mouth daily.     Yes [provider]  colchicine 0.6 MG tablet Take 2 tablets (1.2mg ) by mouth at first sign of gout flare followed by 1 tablet (0.6mg ) after 1 hour. (Max 1.8mg  within 1 hour) Patient not taking: Reported on 01/14/2024 07/30/16  [provider]  memantine  (NAMENDA ) 10 MG tablet Take 10 mg by mouth 2 (two) times daily. Patient not taking: Reported on 12/13/2024 08/03/20     [provider]  neomycin-polymyxin b-dexamethasone (MAXITROL) 3.5-10000-0.1 OINT Place 1 Application into the right eye 4 (four) times daily. Patient not taking: Reported on 12/13/2024 07/22/23     [provider]  nitroGLYCERIN (NITROSTAT) 0.4 MG SL tablet Place under the tongue as needed. 06/11/20     [provider]  ofloxacin (OCUFLOX) 0.3 % ophthalmic solution Place 1 drop into the right eye 4 (four) times daily. Patient not taking: Reported on 12/13/2024 07/22/23     [provider]  prednisoLONE acetate (PRED FORTE) 1 % ophthalmic suspension Place 1 drop into the right eye 4 (four) times daily. Patient not taking: Reported on 12/13/2024 07/22/23     [provider]  sertraline  (ZOLOFT ) 50 MG tablet Take 1 tablet by mouth daily. Patient not taking: Reported on 12/13/2024       [provider]     [Allergies]  [Allergies]      Allergen Reactions   Ramipril        Other reaction(s):   Hiccups   Empagliflozin Other (See Comments)      Other reaction(s): dysuria   Review of Systems  All other systems reviewed and are negative.    Physical Exam Pulmonary:     Effort: Pulmonary effort is normal.  Skin:    General: Skin is warm and dry.  Neurological:     Mental Status: He is alert.       Vital Signs: BP 101/62   Pulse 88   Temp 97.6 F (36.4 C) (Oral)   Resp (!) 36   Ht 5' 11 (1.803 m)   Wt 99.6 kg   SpO2 97%   BMI 30.62 kg/m  Pain Scale: 0-10 Pain Score: 0-No pain     SpO2: SpO2: 97 % O2 Device:SpO2: 97 % O2 Flow Rate: .    IO: Intake/output summary:  Intake/Output Summary (Last 24 hours) at 12/13/2024 1536 Last data filed at 12/13/2024 1355    Gross per 24 hour  Intake 2268.65 ml  Output --  Net 2268.65 ml      LBM:   Baseline Weight: Weight: 99.6 kg Most recent weight: Weight: 99.6 kg         Time In: 1:00 Time Out: 2:00 Time Total: 60 min Greater than 50%  of this time was spent counseling and coordinating care related to the above assessment and plan.   Signed by: Camelia Lewis, NP   Please contact Palliative Medicine Team phone at [phone] for questions and concerns.  For individual provider: See Tracey

## 2024-12-15 NOTE — Progress Notes (Addendum)
 [facility] Liaison Note  Received request from Inpatient Care Manger for family interest in [facility].  Eligibility has been confirmed.  Met/Spoke with patient's spouse  to confirm interest and explain services. Spouse  agreeable to transfer tomorrow morning.  Transitions of Care manager aware.  RN please call report to [phone]  [facility]) prior to patient leaving the unit.  Please send signed and completed DNR with patient at discharge.  Thank you  Marinell Nova,  [facility] Liaison 336 [phone]

## 2024-12-15 NOTE — Progress Notes (Addendum)
 " Progress Note   Patient: James [name] DOB: 1946-06-03 DOA: 12/13/2024     2 DOS: the patient was seen and examined on 12/15/2024   Brief hospital course:  James [name] is a 79 y.o. male with medical history significant of dementia, CAD STEMI status post PCI and stenting 2016, ischemic cardiomyopathy, chronic HFrEF with LVEF less than 30%, PAF on Eliquis ,  IIDM, brought in by family member for evaluation of worsening of confusion, poor oral intake.   Patient is confused and unable to provide any history, with history of provided by wife at bedside.  Patient started to deteriorated about 10 days ago, when he has had significant decrease of oral intake including meals and fluid, wife however has been able to feed him with his pills.  Last 3 days, patient has become more confused, and on average sleeps 14-15 hours a day, when he was awake he mainly stayed in the bed.  Wife found patient has significant decrease of urine output.  There was no nausea vomiting or diarrhea.  And patient has not been complaining of any pain.  Increasedly UF has been having difficulty to help patient to eat or go to bathroom and eventually she decided to bring him to the hospital.   ED Course:  Afebrile, tachycardia EKG showed uncontrolled A-fib, blood pressure 112/79 O2 saturation 100% on room air.  Chest x-ray suspicious for left lower lobe pneumonia, blood work showed AKI creatinine 93 BUN 4.6 glucose 322 bicarb 13K 5.8.   Patient was given ceftriaxone  doxycycline , IV bolus of 2000 mL normal saline and 1 dose of IV Lasix  in the ED.    Assessment and Plan:  Acute metabolic encephalopathy Alzheimer's dementia Patient with a history of Alzheimer's dementia admitted now for evaluation of worsening mental status in the setting of AKI most likely related to renal azotemia Patient remains confused and is only oriented to person, not to place or time There has been no significant improvement in patient's mental  status since his hospitalization     AKI Renal azotemia Compensated combined metabolic acidosis and respiratory alkalosis Hyperkalemia - Clinically patient appeared to be volume depleted, mucous membranes were dry and likely secondary to poor oral intake for last 10 days, but patient also had signs of fluid overload with bilateral ankle edema as well as bilateral pleural effusions on CT scan Patient has a baseline serum creatinine of 0.94 from 07/24 and on admission his serum creatinine was 4.60 >>4.22>>4.46>>5.30 There has not been any significant improvement in his renal function following hydration with bicarbonate infusion.  CT scan of the abdomen and pelvis without contrast showed no nephrolithiasis or ureterolithiasis. No obstructive uropathy. No hydronephrosis. No perinephric or periureteral stranding. Several small bladder diverticula are present, indicating bladder outlet obstruction.  Patient has a Foley catheter in place. Worsening renal function appears to be secondary to ATN from hypotension Patient had hyperkalemia on admission which has improved with bicarbonate infusion and IV Lasix . Appreciate nephrology input        Acute on chronic HFrEF decompensated Acute hypoxic respiratory failure Possible cardiorenal syndrome Last 2D echocardiogram from 2023 showed an LVEF of 30% with grade 1 diastolic dysfunction.  Repeat 2D echocardiogram showed an LVEF of less than 20% Concern for possible cardiorenal syndrome due to worsening renal function associated with bilateral pleural effusions and evidence of fluid overload including bilateral pedal edema. Noted to have room air pulse oximetry of 88%  with associated tachypnea and is currently on 3 L to  maintain pulse oximetry greater than 92% Unable to diurese patient due to relative hypotension Appreciate cardiology input       NSTEMI ruled out Myocardial injury Patient has no chest pains, EKG negative for acute ST changes. Suspect  elevated troponin secondary to demand ischemia from AKI, rapid A-fib and CHF decompensation. Troponin is flat and not consistent with an NSTEMI Will discontinue heparin  Resume patient's Eliquis        PAF with RVR Remains on an amiodarone  drip without any improvement in his heart rate Remains hypotensive which precludes administration of metoprolol  for rate control Not a candidate for digoxin due to AKI Appreciate cardiology input     Sepsis, with acute endorgan damage from a urinary source As evidenced by lactic acidosis, leukocytosis, hypotension and source of sepsis which appears to be urinary Urine culture yielded Enterobacter Cloacae We will switch IV Rocephin  to IV cefepime  Unable to continue IV fluid resuscitation in this patient due to acute systolic CHF Continue midodrine  Leukocytosis persists but shows a downward trend       IIDM Poorly controlled Hemoglobin A1c 9.0 Continue sliding scale insulin  Continue Lantus  10 units and monitor blood sugar closely       Dementia Memantine  and Aricept  remain on hold, due to acute mentation changes.           Subjective: Remains oriented only to person.  Not to place or time.  Oral intake remains very poor.  Physical Exam: Vitals:   12/15/24 0202 12/15/24 0419 12/15/24 0758 12/15/24 1208  BP: (!) 86/75 (!) 103/90 (!) 86/60 (!) 88/57  Pulse:  (!) 105  76  Resp: 20 20 20  (!) 21  Temp: 98.2 F (36.8 C) 98.1 F (36.7 C) 98.3 F (36.8 C) 98.4 F (36.9 C)  TempSrc:      SpO2: 97% 98% 100% 100%  Weight:      Height:       General Exam: Obese, sitting up in bed.  Appears comfortable in no obvious distress Eyes: Pale conjunctiva ENMT: Moist mucous membranes Neck: normal, supple, no masses, no thyromegaly Respiratory: Diminished breathing sound in bilateral lower fields, fine crackles on bilateral lower fields, increasing respiratory effort. No accessory muscle use.  Cardiovascular: Regular rate and rhythm, no murmurs  / rubs / gallops.  2+ extremity edema. 2+ pedal pulses. No carotid bruits.  Abdomen: no tenderness, no masses palpated. No hepatosplenomegaly. Bowel sounds positive.  Musculoskeletal: no clubbing / cyanosis. No joint deformity upper and lower extremities. Good ROM, no contractures. Normal muscle tone.  Skin: no rashes, lesions, ulcers. No induration Neurologic: No facial droops, moving all limbs, oriented only to person not to place or time Psychiatric: Awake, confused   Data Reviewed: Labs reviewed.  White count 12.1, hemoglobin 11.2, platelet count 181, bicarb 13, BUN 102, creatinine 5.30 Labs reviewed  Family Communication: Had an extensive discussion with patient's wife at the bedside.  She understands that he has multiorgan failure and that his overall prognosis is poor. She is interested in hospice evaluation and would like to speak to the hospice liaison.  She had asked a question about renal replacement therapy but due to patient's continued hypotension, he will not be able to tolerate dialysis.  Disposition: Status is: Inpatient Remains inpatient appropriate because: Hospice evaluation discussed with patient's wife at the bedside.  She is agreeable.    Planned Discharge Destination: Inpatient hospice    Time spent: 60 minutes  Author: Aimee Somerset, MD 12/15/2024 12:11 PM  For on call review  [email or web address]  "

## 2024-12-15 NOTE — TOC Initial Note (Signed)
 Transition of Care [facility]) - Initial/Assessment Note    Patient Details  Name: James [name] MRN: [id number] Date of Birth: [birth year]  [facility] CM/SW Contact:    Lauraine JAYSON Carpen, LCSW Phone Number: 12/15/2024, 11:04 AM  Clinical Narrative:   Patient only oriented to self. Wife at bedside. CSW introduced role and explained that hospice would be discussed. Wife would prefer hospice facility if possible. She is a agricultural consultant with [employer] so she is familiar with the facility and their services. If not, she would like to take patient home with hospice but would need to hire caregivers. She gave CSW permission to make referral to [facility] [employer] to assist. CSW is waiting on a call back from their liaison. Patient does not have DME at home except for a CPAP. He would likely need oxygen, wheelchair, BSC, and a rail for the side of his bed. [facility] liaison is aware. No further concerns. CSW will continue to follow patient and his wife for support and facilitate discharge once plan determined. Patient will need ambulance transport at discharge and wife is aware insurance might not cover it.               Expected Discharge Plan: Hospice Medical Facility (vs home with hospice) Barriers to Discharge: Continued Medical Work up   Patient Goals and CMS Choice            Expected Discharge Plan and Services     Post Acute Care Choice: Hospice Living arrangements for the past 2 months: Single Family Home                                      Prior Living Arrangements/Services Living arrangements for the past 2 months: Single Family Home Lives with:: Spouse Patient language and need for interpreter reviewed:: Yes Do you feel safe going back to the place where you live?: Yes      Need for Family Participation in Patient Care: Yes (Comment) Care giver support system in place?: Yes (comment) Current home services: DME Criminal Activity/Legal Involvement Pertinent to Current  Situation/Hospitalization: No - Comment as needed  Activities of Daily Living      Permission Sought/Granted Permission sought to share information with : Facility Medical Sales Representative, Family Supports    Share Information with NAME: James [name]  Permission granted to share info w AGENCY: [facility], [employer]  Permission granted to share info w Relationship: Wife  Permission granted to share info w Contact Information: [phone]  Emotional Assessment Appearance:: Appears stated age Attitude/Demeanor/Rapport: Unable to Assess Affect (typically observed): Unable to Assess Orientation: : Oriented to Self Alcohol / Substance Use: Not Applicable Psych Involvement: No (comment)  Admission diagnosis:  End of life care [Z51.5] NSTEMI (non-ST elevated myocardial infarction) (HCC) [I21.4] AKI (acute kidney injury) [N17.9] Severe sepsis (HCC) [A41.9, R65.20] Patient Active Problem List   Diagnosis Date Noted   AKI (acute kidney injury) 12/13/2024   Acute metabolic encephalopathy 12/13/2024   A-fib (HCC) 12/13/2024   Gait disorder 02/24/2020   Asteatotic eczema 09/20/2019   Stasis dermatitis of both legs 09/20/2019   Ischemic cardiomyopathy 08/19/2019   Mixed Alzheimer's and vascular dementia (HCC) 05/06/2018   BMI 40.0-44.9, adult (HCC) 08/14/2017   Severe obesity (BMI 35.0-39.9) with comorbidity (HCC) 08/14/2017   Benign fibroma of prostate 10/24/2015   Gout 10/24/2015   Acute myocardial infarction of anterior wall (HCC) 10/10/2015   Acute  blood loss anemia 09/22/2015   Ecchymosis 09/22/2015   Acute systolic heart failure (HCC) 09/21/2015   CAD in native artery 09/21/2015   Frank hematuria 09/21/2015   ST elevation myocardial infarction (STEMI) (HCC) 09/21/2015   Avitaminosis D 08/15/2015   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   Other long term (current) drug therapy 02/20/2015   Mild  cognitive disorder 01/25/2015   Mild cognitive impairment 01/25/2015   Chest pain 09/20/2014   Breathlessness on exertion 09/20/2014   H/O transient cerebral ischemia 09/20/2014   B12 deficiency 08/13/2014   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 [place] [address] Phone: [phone] Fax: [phone]  [facility] - [place], [place] - [address] [place] [address] Phone: [phone] Fax: [phone]     Social Drivers of Health (SDOH) Social History: SDOH Screenings   Food Insecurity: No Food Insecurity (09/07/2024)   Received from [facility]  Housing: Low Risk  (09/07/2024)   Received from [facility]  Transportation Needs: No Transportation Needs (09/07/2024)   Received from [facility]  Utilities: Not At Risk (09/07/2024)   Received from [facility]  Financial Resource Strain: Low Risk  (09/07/2024)   Received from [facility]  Tobacco Use: Medium Risk (10/31/2024)   Received from [facility]   SDOH Interventions:     Readmission Risk Interventions     No data to display

## 2024-12-15 NOTE — Progress Notes (Signed)
 " [facility]  ROUNDING NOTE   Subjective:   Patient seen sitting up in bed Spouse at caregiver at bedside Appetite remains poor  Creatinine 5.30 (10)  Objective:  Vital signs in last 24 hours:  Temp:  [98.1 F (36.7 C)-98.4 F (36.9 C)] 98.4 F (36.9 C) (01/29 1208) Pulse Rate:  [63-119] 76 (01/29 1208) Resp:  [16-21] 21 (01/29 1208) BP: (48-129)/(32-90) 88/57 (01/29 1208) SpO2:  [97 %-100 %] 100 % (01/29 1208)  Weight change:  Filed Weights   12/13/24 0809  Weight: 99.6 kg    Intake/Output: I/O last 3 completed shifts: In: 1856.9 [P.O.:270; I.V.:1491.6; IV Piggyback:95.3] Out: 1250 [Urine:1250]   Intake/Output this shift:  No intake/output data recorded.  Physical Exam: General: NAD  Head: Normocephalic, atraumatic. Moist oral mucosal membranes  Eyes: Anicteric  Lungs:  Clear to auscultation, normal effort  Heart: Regular rate and rhythm  Abdomen:  Soft, nontender  Extremities:  No peripheral edema.  Neurologic: Awake, alert, conversant  Skin: Warm,dry, no rash  Access: None    Basic Metabolic Panel: Recent Labs  Lab 12/13/24 0812 12/13/24 0841 12/13/24 1937 12/14/24 0436 12/15/24 0734  NA 134*  --  136 136 135  K 5.8*  --  4.7 5.0 4.7  CL 96*  --  101 100 98  CO2 13*  --  16* 13* 13*  GLUCOSE 322*  --  154* 260* 222*  BUN 93*  --  90* 93* 102*  CREATININE 4.60*  --  4.22* 4.46* 5.30*  CALCIUM  9.3  --  8.4* 8.4* 8.2*  MG  --  2.3  --   --   --   PHOS  --  7.5*  --   --   --     Liver Function Tests: Recent Labs  Lab 12/13/24 0812  AST 51*  ALT 22  ALKPHOS 120  BILITOT 0.9  PROT 6.6  ALBUMIN 3.8   No results for input(s): LIPASE, AMYLASE in the last 168 hours. No results for input(s): AMMONIA in the last 168 hours.  CBC: Recent Labs  Lab 12/13/24 0812 12/14/24 0436 12/15/24 0734  WBC 14.6* 12.0* 12.1*  NEUTROABS 12.4*  --   --   HGB 11.4* 11.0* 11.2*  HCT 37.3* 35.2* 35.2*  MCV 86.1 83.2 83.2  PLT 329 210  181    Cardiac Enzymes: Recent Labs  Lab 12/13/24 0841  CKTOTAL 1,534*    BNP: Invalid input(s): POCBNP  CBG: Recent Labs  Lab 12/14/24 1150 12/14/24 1622 12/14/24 2044 12/15/24 0800 12/15/24 1210  GLUCAP 230* 224* 224* 208* 210*    Microbiology: Results for orders placed or performed during the hospital encounter of 12/13/24  Urine Culture     Status: Abnormal   Collection Time: 12/13/24  8:41 AM   Specimen: Urine, Clean Catch  Result Value Ref Range Status   Specimen Description   Final    URINE, CLEAN CATCH Performed at [facility], [address], [place], [place] [address]    Special Requests   Final    NONE Performed at [facility], [address] [place], [place] [address]    Culture >=100,000 COLONIES/mL ENTEROBACTER CLOACAE (A)  Final   Report Status 12/15/2024 FINAL  Final   Organism ID, Bacteria ENTEROBACTER CLOACAE (A)  Final      Susceptibility   Enterobacter cloacae - MIC*    CEFEPIME  <=0.12 SENSITIVE Sensitive     ERTAPENEM <=0.12 SENSITIVE Sensitive     CIPROFLOXACIN <=0.06  SENSITIVE Sensitive     GENTAMICIN <=1 SENSITIVE Sensitive     NITROFURANTOIN 64 INTERMEDIATE Intermediate     TRIMETH /SULFA  <=20 SENSITIVE Sensitive     PIP/TAZO Value in next row Sensitive      <=4 SENSITIVEThis is a modified FDA-approved test that has been validated and its performance characteristics determined by the reporting laboratory.  This laboratory is certified under the Clinical Laboratory Improvement Amendments CLIA as qualified to perform high complexity clinical laboratory testing.    MEROPENEM Value in next row Sensitive      <=4 SENSITIVEThis is a modified FDA-approved test that has been validated and its performance characteristics determined by the reporting laboratory.  This laboratory is certified under the Clinical Laboratory Improvement Amendments CLIA as qualified to perform high complexity clinical laboratory testing.    *  >=100,000 COLONIES/mL ENTEROBACTER CLOACAE  Resp panel by RT-PCR (RSV, Flu A&B, Covid) Anterior Nasal Swab     Status: None   Collection Time: 12/13/24  8:49 AM   Specimen: Anterior Nasal Swab  Result Value Ref Range Status   SARS Coronavirus 2 by RT PCR NEGATIVE NEGATIVE Final    Comment: (NOTE) SARS-CoV-2 target nucleic acids are NOT DETECTED.  The SARS-CoV-2 RNA is generally detectable in upper respiratory specimens during the acute phase of infection. The lowest concentration of SARS-CoV-2 viral copies this assay can detect is 138 copies/mL. A negative result does not preclude SARS-Cov-2 infection and should not be used as the sole basis for treatment or other patient management decisions. A negative result may occur with  improper specimen collection/handling, submission of specimen other than nasopharyngeal swab, presence of viral mutation(s) within the areas targeted by this assay, and inadequate number of viral copies(<138 copies/mL). A negative result must be combined with clinical observations, patient history, and epidemiological information. The expected result is Negative.  Fact Sheet for Patients:  bloggercourse.com  Fact Sheet for Healthcare Providers:  seriousbroker.it  This test is no t yet approved or cleared by the [place]  FDA and  has been authorized for detection and/or diagnosis of SARS-CoV-2 by FDA under an Emergency Use Authorization (EUA). This EUA will remain  in effect (meaning this test can be used) for the duration of the COVID-19 declaration under Section 564(b)(1) of the Act, 21 U.S.C.section 360bbb-3(b)(1), unless the authorization is terminated  or revoked sooner.       Influenza A by PCR NEGATIVE NEGATIVE Final   Influenza B by PCR NEGATIVE NEGATIVE Final    Comment: (NOTE) The Xpert Xpress SARS-CoV-2/FLU/RSV plus assay is intended as an aid in the diagnosis of influenza from  Nasopharyngeal swab specimens and should not be used as a sole basis for treatment. Nasal washings and aspirates are unacceptable for Xpert Xpress SARS-CoV-2/FLU/RSV testing.  Fact Sheet for Patients: bloggercourse.com  Fact Sheet for Healthcare Providers: seriousbroker.it  This test is not yet approved or cleared by the [place]  FDA and has been authorized for detection and/or diagnosis of SARS-CoV-2 by FDA under an Emergency Use Authorization (EUA). This EUA will remain in effect (meaning this test can be used) for the duration of the COVID-19 declaration under Section 564(b)(1) of the Act, 21 U.S.C. section 360bbb-3(b)(1), unless the authorization is terminated or revoked.     Resp Syncytial Virus by PCR NEGATIVE NEGATIVE Final    Comment: (NOTE) Fact Sheet for Patients: bloggercourse.com  Fact Sheet for Healthcare Providers: seriousbroker.it  This test is not yet approved or cleared by the [place]  FDA and has been  authorized for detection and/or diagnosis of SARS-CoV-2 by FDA under an Emergency Use Authorization (EUA). This EUA will remain in effect (meaning this test can be used) for the duration of the COVID-19 declaration under Section 564(b)(1) of the Act, 21 U.S.C. section 360bbb-3(b)(1), unless the authorization is terminated or revoked.  Performed at [facility], [address] [place], [place] [address]   Blood culture (routine x 2)     Status: None (Preliminary result)   Collection Time: 12/13/24  9:25 AM   Specimen: BLOOD  Result Value Ref Range Status   Specimen Description BLOOD BLOOD LEFT ARM  Final   Special Requests   Final    BOTTLES DRAWN AEROBIC AND ANAEROBIC Blood Culture adequate volume   Culture   Final    NO GROWTH 2 DAYS Performed at [facility], [address], [place], [place] [address]    Report Status  PENDING  Incomplete  Blood culture (routine x 2)     Status: None (Preliminary result)   Collection Time: 12/13/24  9:25 AM   Specimen: BLOOD  Result Value Ref Range Status   Specimen Description BLOOD BLOOD LEFT ARM  Final   Special Requests   Final    BOTTLES DRAWN AEROBIC AND ANAEROBIC Blood Culture results may not be optimal due to an inadequate volume of blood received in culture bottles   Culture   Final    NO GROWTH 2 DAYS Performed at [facility], [address] [place], [place] [address]    Report Status PENDING  Incomplete    Coagulation Studies: Recent Labs    12/13/24 1035  LABPROT 21.8*  INR 1.8*    Urinalysis: Recent Labs    12/13/24 0842  COLORURINE YELLOW*  LABSPEC 1.015  PHURINE 5.0  GLUCOSEU 50*  HGBUR MODERATE*  BILIRUBINUR NEGATIVE  KETONESUR NEGATIVE  PROTEINUR 30*  NITRITE NEGATIVE  LEUKOCYTESUR LARGE*      Imaging: ECHOCARDIOGRAM COMPLETE Result Date: 12/15/2024    ECHOCARDIOGRAM REPORT   Patient Name:   James [name] Date of Exam: 12/14/2024 Medical Rec #:  [id number]    Height:       71.0 in Accession #:    [id number]   Weight:       219.6 lb Date of Birth:  10/04/1946     BSA:          2.194 m Patient Age:    78 years     BP:           76/54 mmHg Patient Gender: M            HR:           120 bpm. Exam Location:  [facility] Procedure: 2D Echo, Cardiac Doppler and Color Doppler (Both Spectral and Color            Flow Doppler were utilized during procedure). Indications:     I48.91 Atrial Fibrillation  History:         Patient has no prior history of Echocardiogram examinations.                  Cardiomyopathy, CAD and Previous Myocardial Infarction, Stroke;                  Risk Factors:Hypertension, Diabetes and Sleep Apnea.  Sonographer:     Carl Coma RDCS Referring Phys:  [id number] CARALYN HUDSON Diagnosing Phys: James Alluri IMPRESSIONS  1. Left ventricular ejection fraction, by estimation, is <20%. The left ventricle  has severely  decreased function. The left ventricle demonstrates regional wall motion abnormalities (see scoring diagram/findings for description). The left ventricular internal cavity size was moderately dilated. There is mild left ventricular hypertrophy. Left ventricular diastolic parameters are consistent with Grade II diastolic dysfunction (pseudonormalization).  2. Right ventricular systolic function is severely reduced. The right ventricular size is mildly enlarged.  3. Left atrial size was mildly dilated.  4. The mitral valve is normal in structure. Moderate to severe mitral valve regurgitation.  5. The aortic valve is tricuspid. Aortic valve regurgitation is not visualized. FINDINGS  Left Ventricle: Left ventricular ejection fraction, by estimation, is <20%. The left ventricle has severely decreased function. The left ventricle demonstrates regional wall motion abnormalities. The left ventricular internal cavity size was moderately dilated. There is mild left ventricular hypertrophy. Left ventricular diastolic parameters are consistent with Grade II diastolic dysfunction (pseudonormalization).  LV Wall Scoring: The mid and distal anterior wall, mid and distal anterior septum, entire apex, and mid inferoseptal segment are akinetic. The antero-lateral wall, inferior wall, posterior wall, basal anteroseptal segment, basal anterior segment, and basal inferoseptal segment are hypokinetic. Right Ventricle: The right ventricular size is mildly enlarged. No increase in right ventricular wall thickness. Right ventricular systolic function is severely reduced. Left Atrium: Left atrial size was mildly dilated. Right Atrium: Right atrial size was normal in size. Pericardium: Trivial pericardial effusion is present. Mitral Valve: The mitral valve is normal in structure. Moderate to severe mitral valve regurgitation. Tricuspid Valve: The tricuspid valve is normal in structure. Tricuspid valve regurgitation is trivial. Aortic Valve:  The aortic valve is tricuspid. Aortic valve regurgitation is not visualized. Pulmonic Valve: The pulmonic valve was not well visualized. Pulmonic valve regurgitation is mild. Aorta: The aortic root is normal in size and structure. IAS/Shunts: The atrial septum is grossly normal. Additional Comments: There is pleural effusion in the left lateral region.  LEFT VENTRICLE PLAX 2D LVIDd:         6.60 cm LVIDs:         6.10 cm LV PW:         1.20 cm LV IVS:        1.10 cm LVOT diam:     2.30 cm LV SV:         17 LV SV Index:   8 LVOT Area:     4.15 cm  RIGHT VENTRICLE            IVC RV Basal diam:  3.90 cm    IVC diam: 1.80 cm RV S prime:     5.44 cm/s TAPSE (M-mode): 1.0 cm LEFT ATRIUM             Index        RIGHT ATRIUM           Index LA diam:        5.10 cm 2.32 cm/m   RA Area:     17.50 cm LA Vol (A2C):   49.7 ml 22.65 ml/m  RA Volume:   47.90 ml  21.83 ml/m LA Vol (A4C):   78.2 ml 35.64 ml/m LA Biplane Vol: 64.2 ml 29.26 ml/m  AORTIC VALVE LVOT Vmax:   36.14 cm/s LVOT Vmean:  23.620 cm/s LVOT VTI:    0.041 m  AORTA Ao Root diam: 3.60 cm Ao Asc diam:  3.90 cm MV E velocity: 104.00 cm/s  TRICUSPID VALVE  TR Peak grad:   24.2 mmHg                             TR Vmax:        246.00 cm/s                              SHUNTS                             Systemic VTI:  0.04 m                             Systemic Diam: 2.30 cm James [name] Electronically signed by James [name] Signature Date/Time: 12/15/2024/2:05:26 PM    Final    DG Chest 1 View Result Date: 12/14/2024 EXAM: 1 VIEW(S) XRAY OF THE CHEST 12/14/2024 07:07:00 AM COMPARISON: 12/13/2024 CLINICAL HISTORY: Congestive heart failure. FINDINGS: LUNGS AND PLEURA: Stable small left pleural effusion and left basilar opacity. No pneumothorax. HEART AND MEDIASTINUM: Cardiomegaly, unchanged. Atherosclerotic calcifications. BONES AND SOFT TISSUES: No acute osseous abnormality. IMPRESSION: 1. Stable small left pleural effusion and left  basilar opacity. 2. Cardiomegaly, unchanged. Electronically signed by: Waddell Calk MD 12/14/2024 07:10 AM EST RP Workstation: [id number]   DG Chest 1 View Result Date: 12/13/2024 EXAM: 1 VIEW(S) XRAY OF THE CHEST 12/13/2024 06:08:00 PM COMPARISON: 12/13/2024 CLINICAL HISTORY: Congestive heart failure. ICD10: [id number] Congestive heart failure. FINDINGS: LUNGS AND PLEURA: Small left pleural effusion. Similar left retrocardiac opacity. No pneumothorax. HEART AND MEDIASTINUM: Cardiomegaly. BONES AND SOFT TISSUES: No acute osseous abnormality. IMPRESSION: 1. Small left pleural effusion and similar left retrocardiac opacity. No change from the prior exam from earlier in the same day. Electronically signed by: Oneil Devonshire MD 12/13/2024 11:44 PM EST RP Workstation: [id number]     Medications:    amiodarone  30 mg/hr (12/15/24 0018)   ceFEPime  (MAXIPIME ) IV 1 g (12/15/24 0924)    allopurinol   100 mg Oral Daily   apixaban   5 mg Oral BID   aspirin   81 mg Oral Daily   buPROPion   150 mg Oral Daily   Chlorhexidine  Gluconate Cloth  6 each Topical Daily   insulin  aspart  0-9 Units Subcutaneous TID WC   insulin  glargine-yfgn  10 Units Subcutaneous QHS   midodrine   10 mg Oral TID WC   tamsulosin   0.4 mg Oral QPC supper   acetaminophen  **OR** acetaminophen , albuterol , HYDROmorphone  (DILAUDID ) injection, metoprolol  tartrate, ondansetron  **OR** ondansetron  (ZOFRAN ) IV, traZODone   Assessment/ Plan:  James [name] is a 79 y.o.  male with past medical conditions including CAD, STEMI status post PCI, chronic heart failure, diabetes, atrial fibrillation on Eliquis , and dementia, who was admitted to [facility] on 12/13/2024 for End of life care [Z51.5] NSTEMI (non-ST elevated myocardial infarction) (HCC) [I21.4] AKI (acute kidney injury) [N17.9] Severe sepsis (HCC) [A41.9, R65.20]   Acute kidney injury on chronic kidney disease stage III A.  Baseline creatinine appears to be 1.4 with GFR 51 on 10/27/2024.  Acute  kidney injury appears to be multifactorial from dehydration and infection.  Recent increase in metformin, 1500 mg daily, currently held.  Creatinine continues to rise, 5.3 today. Discussed with caregiver our concern for continued renal failure. She is appreciative but feels dialysis would not benefit his current condition and prove burdensome. Also voices concerns of increased fatigue  with treatment, when he's already so weak now. Family leaning towards transition to comfort/hospice care.   Lab Results  Component Value Date   CREATININE 5.30 (H) 12/15/2024   CREATININE 4.46 (H) 12/14/2024   CREATININE 4.22 (H) 12/13/2024    Intake/Output Summary (Last 24 hours) at 12/15/2024 1500 Last data filed at 12/15/2024 0300 Gross per 24 hour  Intake 510.87 ml  Output --  Net 510.87 ml    2. Anemia of chronic kidney disease Lab Results  Component Value Date   HGB 11.2 (L) 12/15/2024    Hgb stable  3. Acute metabolic acidosis, serum bicarb remains decreased. Stopped due to concern for volume status. Could consider oral supplementation.    LOS: 2 Amairani Shuey 1/29/20263:00 PM   "

## 2024-12-15 NOTE — Consult Note (Signed)
 PHARMACY - ANTICOAGULATION CONSULT NOTE  Pharmacy Consult for heparin  dosing Indication: NSTEMI  Allergies[1]  Patient Measurements: Height: 5' 11 (180.3 cm) Weight: 99.6 kg (219 lb 9.3 oz) IBW/kg (Calculated) : 75.3 HEPARIN  DW (KG): 95.8  Vital Signs: Temp: 98.3 F (36.8 C) (01/29 0758) BP: 86/60 (01/29 0758) Pulse Rate: 105 (01/29 0419)  Labs: Recent Labs    12/13/24 0812 12/13/24 0841 12/13/24 1035 12/13/24 1035 12/13/24 1937 12/14/24 0436 12/15/24 0734  HGB 11.4*  --   --   --   --  11.0* 11.2*  HCT 37.3*  --   --   --   --  35.2* 35.2*  PLT 329  --   --   --   --  210 181  APTT  --   --  32   < > 87* 79* 87*  LABPROT  --   --  21.8*  --   --   --   --   INR  --   --  1.8*  --   --   --   --   HEPARINUNFRC  --   --  >1.10*  --   --  >1.10* >1.10*  CREATININE 4.60*  --   --   --  4.22* 4.46* 5.30*  CKTOTAL  --  1,534*  --   --   --   --   --    < > = values in this interval not displayed.    Estimated Creatinine Clearance: 13.8 mL/min (A) (by C-G formula based on SCr of 5.3 mg/dL (H)).   Medical History: Past Medical History:  Diagnosis Date   Asteatotic eczema    BPH (benign prostatic hyperplasia)    CAD (coronary artery disease)    Chronic cough    Chronic cough    Chronic diastolic CHF (congestive heart failure) (HCC)    Ejection fraction < 50%    Erectile dysfunction    Erectile dysfunction    Gait disorder    Gout    Grade I diastolic dysfunction    Heart attack (HCC) 2016   History of heart artery stent    History of ST elevation myocardial infarction (STEMI)    Hypertension associated with type 2 diabetes mellitus (HCC)    Hypogonadism in male    Ischemic cardiomyopathy    Low left ventricular ejection fraction    Mixed Alzheimer's and vascular dementia (HCC)    OSA on CPAP    Paroxysmal atrial fibrillation (HCC)    Sleep apnea    CPAP   Stasis dermatitis of both legs    Stroke (HCC) 2009   Peripheral vision impairment   Type 2  diabetes mellitus (HCC)    Type 2 diabetes mellitus with other specified complication (HCC)     Medications:  PTA apixaban  5mg  BID (last dose 01/26 ~1800)  Assessment: James [name] is a 19 yoM presenting with concerns for NSTEMI. Past medical history is notable for CAD and ischemic cardiomyopathy, STEMI (2016), TIA, CHF, HLD, paroxysmal A-fib, DM, anticoagulated on Eliquis  (last dose 1/26 ~1800), and progressive dementia.  On arrival, unconfirmed EKG demonstrating atrial fibrillation and nonspecific T abnormalities. Troponin 693. CT chest has been ordered.  Baseline hgb 11.4, plt 329, INR 1.8, aPTT 32, and HL >1.10.  Goal of Therapy:  Heparin  level 0.3-0.7 units/ml aPTT 66-102 seconds Monitor platelets by anticoagulation protocol: Yes   01/27 1937 aPTT 87, therapeutic x 1 01/28 0436 aPTT 79,  HL > 1.10 01/29 0734 aPTT 87,  HL > 1.10  Plan:  - aPTT therapeutic X 3 , HL still elevated from Eliquis  PTA - Will continue pt on current heparin  rate and recheck aPTT and HL with AM labs - Adjust based on aPTT until correlation with Anti-Xa level - CBC daily while on heparin    Ransom Blanch PGY-1 Pharmacy Resident  [place] [facility]  12/15/2024 9:48 AM         [1]  Allergies Allergen Reactions   Ramipril     Other reaction(s):   Hiccups   Empagliflozin Other (See Comments)    Other reaction(s): dysuria

## 2024-12-15 NOTE — TOC CM/SW Note (Signed)
 Transition of Care [facility]) - Inpatient Brief Assessment   Patient Details  Name: James [name] MRN: [id number] Date of Birth: February 26, 1946  Transition of [facility]) CM/SW Contact:    Lauraine JAYSON Carpen, LCSW Phone Number: 12/15/2024, 9:08 AM   Clinical Narrative: CSW reviewed chart. Patient is currently on acute oxygen. Will follow for this potential discharge need. No other TOC needs identified at this time. Please place [facility] consult if any needs arise.  Transition of Care Asessment: Insurance and Status: Insurance coverage has been reviewed Patient has primary care physician: Yes Home environment has been reviewed: Single family home Prior level of function:: Not documented Prior/Current Home Services: No current home services Social Drivers of Health Review: SDOH reviewed no interventions necessary Readmission risk has been reviewed: Yes Transition of care needs: no transition of care needs at this time

## 2024-12-15 NOTE — Progress Notes (Addendum)
 New stage 2 pressure injury found on left buttock. MD made aware. Skin care standing orders placed. Wound consult in.

## 2024-12-15 NOTE — Plan of Care (Signed)
   Problem: Skin Integrity: Goal: Risk for impaired skin integrity will decrease Outcome: Progressing

## 2024-12-15 NOTE — Consult Note (Addendum)
"  °  CLINICAL SUPPORT TEAM - WOUND OSTOMY AND CONTINENCE TEAM  CONSULTATION SERVICES   WOC Nurse-Inpatient Note  WOC Nurse Consult Note: Reason for Consult: stage 2 buttocks  Wound type: Stage 2 Pressure Injury L buttock  Pressure Injury POA: Yes Measurement: 1 cm x 1 cm per nursing flowsheet  Wound bed: red moist  Drainage (amount, consistency, odor) serosanguinous  Periwound: appears macerated, moisture and friction  Dressing procedure/placement/frequency: Cleanse L buttock wound with NS, apply silver hydrofiber ([id number]) to wound bed daily and secure with silicone foam. May lift foam daily to replace silver, change foam q3 days and prn soiling.   POC discussed with bedside nurse. WOC team will not follow. Reconsult if further needs arise.   Thank you,    Charlita Brian MSN, RN-BC, CWOCN     "

## 2024-12-15 NOTE — Plan of Care (Signed)
 PMT shadowing given wife's preference to work directly with attending and specialty teams. Current plans in place for patient to discharge to hospice facility tomorrow morning.  As goals are set, PMT will sign off.  Please reconsult if needs arise.

## 2024-12-15 NOTE — Progress Notes (Signed)
 MD aware of morning BP. No new orders.

## 2024-12-15 NOTE — Progress Notes (Signed)
 " [facility] CLINIC CARDIOLOGY PROGRESS NOTE       Patient ID: James [name] MRN: [id number] DOB/AGE: Jul 23, 1946 79 y.o.  Admit date: 12/13/2024 Referring Physician Dr. Lanetta Primary Physician Lenon Layman ORN, MD  Primary Cardiologist Dr. Raina Pierre, PA Reason for Consultation AF RVR  HPI: [name] is a 79 y.o. male  with a past medical history of coronary artery disease s/p DES to proximal and mid LAD 2016 with staged DES to RCA 2016, mild ischemic cardiomyopathy, hypertension, hyperlipidemia, type 2 diabetes, OSA on CPAP, history of dementia, paroxysmal atrial fibrillation on Eliquis  who presented to the ED on 12/13/2024 for confusion, poor intake.  Has been in A-fib RVR and overnight was started on IV amiodarone .  Cardiology was consulted for further evaluation.   Interval history: - Patient seen and examined this morning, resting comfortably in hospital bed.  No family at bedside. - He denies any chest discomfort, shortness of breath, palpitations today. - Heart rate remains elevated on telemetry, remains on IV amiodarone  infusion.  BP is improved with p.o. midodrine .  Review of systems complete and found to be negative unless listed above    Past Medical History:  Diagnosis Date   Asteatotic eczema    BPH (benign prostatic hyperplasia)    CAD (coronary artery disease)    Chronic cough    Chronic cough    Chronic diastolic CHF (congestive heart failure) (HCC)    Ejection fraction < 50%    Erectile dysfunction    Erectile dysfunction    Gait disorder    Gout    Grade I diastolic dysfunction    Heart attack (HCC) 2016   History of heart artery stent    History of ST elevation myocardial infarction (STEMI)    Hypertension associated with type 2 diabetes mellitus (HCC)    Hypogonadism in male    Ischemic cardiomyopathy    Low left ventricular ejection fraction    Mixed Alzheimer's and vascular dementia (HCC)    OSA on CPAP    Paroxysmal atrial fibrillation  (HCC)    Sleep apnea    CPAP   Stasis dermatitis of both legs    Stroke (HCC) 2009   Peripheral vision impairment   Type 2 diabetes mellitus (HCC)    Type 2 diabetes mellitus with other specified complication [facility])     Past Surgical History:  Procedure Laterality Date   abdominal cyst surgery     CARDIAC CATHETERIZATION  2016   3 stents   CATARACT EXTRACTION W/PHACO Left 01/25/2024   Procedure: CATARACT EXTRACTION PHACO AND INTRAOCULAR LENS PLACEMENT (IOC) LEFT DIABETIC 7.63, 00:43.3;  Surgeon: Myrna Adine Anes, MD;  Location: [facility];  Service: Ophthalmology;  Laterality: Left;   RETINAL DETACHMENT SURGERY Right 07/22/2023   [facility]    Medications Prior to Admission  Medication Sig Dispense Refill Last Dose/Taking   allopurinol  (ZYLOPRIM ) 100 MG tablet Take 1 tablet by mouth daily.   12/12/2024 Evening   apixaban  (ELIQUIS ) 5 MG TABS tablet Take 5 mg by mouth 2 (two) times daily.   12/12/2024 Evening   aspirin  81 MG tablet Take 81 mg by mouth daily.   12/12/2024 Morning   atorvastatin  (LIPITOR ) 80 MG tablet Take 1 tablet by mouth.   12/12/2024 Morning   buPROPion  (WELLBUTRIN  XL) 150 MG 24 hr tablet Take 150 mg by mouth daily.   12/12/2024 Morning   cyanocobalamin (VITAMIN B12) 1000 MCG tablet Take 1,000 mcg by mouth daily.   12/12/2024 Evening   donepezil  (  ARICEPT ) 10 MG tablet TAKE 1 TABLET BY MOUTH EVERY DAY AT NIGHT  2 12/12/2024 Evening   metFORMIN (GLUCOPHAGE-XR) 500 MG 24 hr tablet Take 1,500 mg by mouth daily.   12/12/2024 Evening   metoprolol  succinate (TOPROL -XL) 25 MG 24 hr tablet Take 25 mg by mouth daily.   12/12/2024 Morning   POTASSIUM CHLORIDE PO Take 10 mEq by mouth daily.   12/12/2024 Morning   torsemide (DEMADEX) 20 MG tablet Take 20 mg by mouth daily.   12/12/2024 Morning   Vitamin D, Cholecalciferol, 1000 UNITS CAPS Take by mouth daily.   12/12/2024 Morning   colchicine 0.6 MG tablet Take 2 tablets (1.2mg ) by mouth at first sign of gout flare followed by 1 tablet  (0.6mg ) after 1 hour. (Max 1.8mg  within 1 hour) (Patient not taking: Reported on 01/14/2024)   Unknown   memantine  (NAMENDA ) 10 MG tablet Take 10 mg by mouth 2 (two) times daily. (Patient not taking: Reported on 12/13/2024)   Not Taking   neomycin-polymyxin b-dexamethasone (MAXITROL) 3.5-10000-0.1 OINT Place 1 Application into the right eye 4 (four) times daily. (Patient not taking: Reported on 12/13/2024)   Not Taking   nitroGLYCERIN (NITROSTAT) 0.4 MG SL tablet Place under the tongue as needed.   Unknown   ofloxacin (OCUFLOX) 0.3 % ophthalmic solution Place 1 drop into the right eye 4 (four) times daily. (Patient not taking: Reported on 12/13/2024)   Not Taking   prednisoLONE acetate (PRED FORTE) 1 % ophthalmic suspension Place 1 drop into the right eye 4 (four) times daily. (Patient not taking: Reported on 12/13/2024)   Not Taking   sertraline  (ZOLOFT ) 50 MG tablet Take 1 tablet by mouth daily. (Patient not taking: Reported on 12/13/2024)   Not Taking   Social History   Socioeconomic History   Marital status: Married    Spouse name: Not on file   Number of children: Not on file   Years of education: Not on file   Highest education level: Not on file  Occupational History   Not on file  Tobacco Use   Smoking status: Former    Current packs/day: 0.00    Average packs/day: 1.5 packs/day for 25.0 years (37.5 ttl pk-yrs)    Types: Cigarettes    Start date: 11/18/1959    Quit date: 11/17/1984    Years since quitting: 40.1   Smokeless tobacco: Never   Tobacco comments:    years ago  Vaping Use   Vaping status: Never Used  Substance and Sexual Activity   Alcohol use: No    Alcohol/week: 0.0 standard drinks of alcohol   Drug use: No   Sexual activity: Not on file  Other Topics Concern   Not on file  Social History Narrative   Not on file   Social Drivers of Health   Tobacco Use: Medium Risk (10/31/2024)   Received from [facility]   Patient History    Smoking Tobacco  Use: Former    Smokeless Tobacco Use: Never    Passive Exposure: Not on file  Financial Resource Strain: Low Risk  (09/07/2024)   Received from [facility]   Overall Financial Resource Strain (CARDIA)    Difficulty of Paying Living Expenses: Not hard at all  Food Insecurity: No Food Insecurity (09/07/2024)   Received from [facility]   Epic    Within the past 12 months, you worried that your food would run out before you got the money to buy more.:  Never true    Within the past 12 months, the food you bought just didn't last and you didn't have money to get more.: Never true  Transportation Needs: No Transportation Needs (09/07/2024)   Received from [facility] - Transportation    In the past 12 months, has lack of transportation kept you from medical appointments or from getting medications?: No    Lack of Transportation (Non-Medical): No  Physical Activity: Not on file  Stress: Not on file  Social Connections: Not on file  Intimate Partner Violence: Not on file  Depression (EYV7-0): Not on file  Alcohol Screen: Not on file  Housing: Low Risk  (09/07/2024)   Received from [facility]   Epic    In the last 12 months, was there a time when you were not able to pay the mortgage or rent on time?: No    In the past 12 months, how many times have you moved where you were living?: 0    At any time in the past 12 months, were you homeless or living in a shelter (including now)?: No  Utilities: Not At Risk (09/07/2024)   Received from [facility]   Epic    In the past 12 months has the electric, gas, oil, or water company threatened to shut off services in your home?: No  Health Literacy: Not on file    Family History  Problem Relation Age of Onset   Kidney disease Neg Hx    Prostate cancer Neg Hx    Bladder Cancer Neg Hx    Kidney cancer Neg Hx      Vitals:   12/14/24 2329 12/15/24  0202 12/15/24 0419 12/15/24 0758  BP: (!) 84/59 (!) 86/75 (!) 103/90 (!) 86/60  Pulse: (!) 107  (!) 105   Resp: 20 20 20 20   Temp: 98.2 F (36.8 C) 98.2 F (36.8 C) 98.1 F (36.7 C) 98.3 F (36.8 C)  TempSrc:      SpO2: 98% 97% 98% 100%  Weight:      Height:        PHYSICAL EXAM General: Chronically ill-appearing elderly male, well nourished, in no acute distress. HEENT: Normocephalic and atraumatic. Neck: No JVD.  Lungs: Normal respiratory effort on 2L [place].  Diminished bilaterally Heart: Irregularly irregular, elevated rate. Normal S1 and S2 without gallops or murmurs.  Abdomen: Non-distended appearing.  Msk: Normal strength and tone for age. Extremities: Warm and well perfused. No clubbing, cyanosis.  Trace edema.  Neuro: Alert and oriented X 3. Psych: Answers questions appropriately.   Labs: Basic Metabolic Panel: Recent Labs    12/13/24 0841 12/13/24 1937 12/14/24 0436 12/15/24 0734  NA  --    < > 136 135  K  --    < > 5.0 4.7  CL  --    < > 100 98  CO2  --    < > 13* 13*  GLUCOSE  --    < > 260* 222*  BUN  --    < > 93* 102*  CREATININE  --    < > 4.46* 5.30*  CALCIUM   --    < > 8.4* 8.2*  MG 2.3  --   --   --   PHOS 7.5*  --   --   --    < > = values in this interval not displayed.   Liver Function Tests: Recent Labs    12/13/24  0812  AST 51*  ALT 22  ALKPHOS 120  BILITOT 0.9  PROT 6.6  ALBUMIN 3.8   No results for input(s): LIPASE, AMYLASE in the last 72 hours. CBC: Recent Labs    12/13/24 0812 12/14/24 0436 12/15/24 0734  WBC 14.6* 12.0* 12.1*  NEUTROABS 12.4*  --   --   HGB 11.4* 11.0* 11.2*  HCT 37.3* 35.2* 35.2*  MCV 86.1 83.2 83.2  PLT 329 210 181   Cardiac Enzymes: Recent Labs    12/13/24 0841  CKTOTAL 1,534*   BNP: No results for input(s): BNP in the last 72 hours. D-Dimer: No results for input(s): DDIMER in the last 72 hours. Hemoglobin A1C: Recent Labs    12/13/24 1638  HGBA1C 9.0*   Fasting Lipid  Panel: No results for input(s): CHOL, HDL, LDLCALC, TRIG, CHOLHDL, LDLDIRECT in the last 72 hours. Thyroid Function Tests: No results for input(s): TSH, T4TOTAL, T3FREE, THYROIDAB in the last 72 hours.  Invalid input(s): FREET3 Anemia Panel: No results for input(s): VITAMINB12, FOLATE, FERRITIN, TIBC, IRON, RETICCTPCT in the last 72 hours.   Radiology: DG Chest 1 View Result Date: 12/14/2024 EXAM: 1 VIEW(S) XRAY OF THE CHEST 12/14/2024 07:07:00 AM COMPARISON: 12/13/2024 CLINICAL HISTORY: Congestive heart failure. FINDINGS: LUNGS AND PLEURA: Stable small left pleural effusion and left basilar opacity. No pneumothorax. HEART AND MEDIASTINUM: Cardiomegaly, unchanged. Atherosclerotic calcifications. BONES AND SOFT TISSUES: No acute osseous abnormality. IMPRESSION: 1. Stable small left pleural effusion and left basilar opacity. 2. Cardiomegaly, unchanged. Electronically signed by: Waddell Calk MD 12/14/2024 07:10 AM EST RP Workstation: [id number]   DG Chest 1 View Result Date: 12/13/2024 EXAM: 1 VIEW(S) XRAY OF THE CHEST 12/13/2024 06:08:00 PM COMPARISON: 12/13/2024 CLINICAL HISTORY: Congestive heart failure. ICD10: [id number] Congestive heart failure. FINDINGS: LUNGS AND PLEURA: Small left pleural effusion. Similar left retrocardiac opacity. No pneumothorax. HEART AND MEDIASTINUM: Cardiomegaly. BONES AND SOFT TISSUES: No acute osseous abnormality. IMPRESSION: 1. Small left pleural effusion and similar left retrocardiac opacity. No change from the prior exam from earlier in the same day. Electronically signed by: Oneil Devonshire MD 12/13/2024 11:44 PM EST RP Workstation: [id number]   CT CHEST WO CONTRAST Result Date: 12/13/2024 EXAM: CT CHEST WITHOUT CONTRAST 12/13/2024 11:36:35 AM TECHNIQUE: CT of the chest was performed without the administration of intravenous contrast. Multiplanar reformatted images are provided for review. Automated exposure control, iterative  reconstruction, and/or weight based adjustment of the mA/kV was utilized to reduce the radiation dose to as low as reasonably achievable. COMPARISON: None available. CLINICAL HISTORY: Pneumonia, complication suspected, x-ray done. FINDINGS: MEDIASTINUM: Heart: Mild cardiac enlargement. 3-vessel coronary artery calcifications. Vessels: The main pulmonary artery is increased in caliber, measuring 4 cm. Aortic atherosclerosis. Airways: The central airways are clear. LYMPH NODES: No enlarged mediastinal or axillary lymph nodes. LUNGS AND PLEURA: Pleura: Small-to-moderate bilateral pleural effusions are identified, left greater than right. No pneumothorax. Lungs: No airspace consolidation to suggest pneumonia. Mild dependent ground-glass attenuation is noted within the posterior upper and lower lung zones. Small calcified granuloma identified within the left lower lobe. SOFT TISSUES/BONES: No acute abnormality of the bones or soft tissues. UPPER ABDOMEN: Calcification identified in the liver. No acute abnormality noted within the imaged portions of the upper abdomen. IMPRESSION: 1. No CT evidence of pneumonia. 2. Small-to-moderate bilateral pleural effusions, left greater than right. 3. Increased caliber of the main pulmonary artery compatible with pulmonary artery hypertension. 4. Aortic atherosclerosis and coronary artery calcification. Electronically signed by: Waddell Calk MD 12/13/2024 12:01 PM EST RP Workstation: [id number]  CT ABDOMEN PELVIS WO CONTRAST Result Date: 12/13/2024 EXAM: CT ABDOMEN AND PELVIS WITHOUT CONTRAST 12/13/2024 10:07:26 AM TECHNIQUE: CT of the abdomen and pelvis was performed without the administration of intravenous contrast. Multiplanar reformatted images are provided for review. Automated exposure control, iterative reconstruction, and/or weight-based adjustment of the mA/kV was utilized to reduce the radiation dose to as low as reasonably achievable. COMPARISON: None available.  CLINICAL HISTORY: Acute renal failure, right-sided abdominal tenderness. Evaluate ureteral obstruction. FINDINGS: LOWER CHEST: Bilateral layering pleural effusions. LIVER: The liver is unremarkable. GALLBLADDER AND BILE DUCTS: Gallbladder is unremarkable. No biliary ductal dilatation. SPLEEN: No acute abnormality. PANCREAS: No acute abnormality. ADRENAL GLANDS: A non left adrenal adenoma. KIDNEYS, URETERS AND BLADDER: No nephrolithiasis or ureterolithiasis. No obstructive uropathy. No hydronephrosis. No perinephric or periureteral stranding. Several small bladder diverticula are present, indicating bladder outlet obstruction. GI AND BOWEL: Stomach demonstrates no acute abnormality. There is no bowel obstruction. Appendix is normal. PERITONEUM AND RETROPERITONEUM: No ascites. No free air. VASCULATURE: Aorta is normal in caliber. LYMPH NODES: No lymphadenopathy. REPRODUCTIVE ORGANS: No acute enlargement of the posterior gland 10 mm. BONES AND SOFT TISSUES: No acute osseous abnormality. No focal soft tissue abnormality. IMPRESSION: 1. No nephrolithiasis, ureterolithiasis, or obstructive uropathy. 2. Several small bladder diverticula, potential partial bladder outlet obstruction. 3. No acute findings in the abdomen or pelvis, with normal appendix. 4. Bilateral pleural effusions. Electronically signed by: Norleen Boxer MD 12/13/2024 11:28 AM EST RP Workstation: [id number]   DG Chest Portable 1 View Result Date: 12/13/2024 EXAM: 1 VIEW(S) XRAY OF THE CHEST 12/13/2024 09:41:00 AM COMPARISON: 10/06/2023 CLINICAL HISTORY: Cough. FINDINGS: LUNGS AND PLEURA: Small left pleural effusion. Left lower lobe airspace opacity, possibly intracardiac in location. No pneumothorax. HEART AND MEDIASTINUM: Cardiomegaly. Atherosclerotic calcifications. BONES AND SOFT TISSUES: No acute osseous abnormality. IMPRESSION: 1. Left retrocardiac airspace opacity, atelectasis versus pneumonia. 2. Small left pleural effusion. 3. Cardiomegaly.  Electronically signed by: Katheleen Faes MD 12/13/2024 09:46 AM EST RP Workstation: [id number]    ECHO ordered  TELEMETRY (personally reviewed): Atrial fibrillation rate 110-120s  EKG (personally reviewed): Atrial fibrillation rate 103 bpm  Data reviewed by me 12/15/2024: last 24h vitals tele labs imaging I/O ED provider note, admission H&P  Principal Problem:   AKI (acute kidney injury) Active Problems:   Acute metabolic encephalopathy   A-fib (HCC)    ASSESSMENT AND PLAN:  Knut Rondinelli is a 79 y.o. male  with a past medical history of coronary artery disease s/p DES to proximal and mid LAD 2016 with staged DES to RCA 2016, mild ischemic cardiomyopathy, hypertension, hyperlipidemia, type 2 diabetes, OSA on CPAP, history of dementia, paroxysmal atrial fibrillation on Eliquis  who presented to the ED on 12/13/2024 for confusion, poor intake.  Has been in A-fib RVR and overnight was started on IV amiodarone .  Cardiology was consulted for further evaluation.   # Atrial fibrillation RVR # ?Acute on chronic HFrEF # AKI on CKD # Hypotension # Coronary artery disease Patient presented with confusion, decreased intake. Denies any CP, SOB, palpitations. CT with pleural effusions. Developed AF RVR overnight and started on IV amiodarone . Cr up to 4.6 from baseline 1.4.  -Continue IV amiodarone . Additional options for rate/rhythm control limited given BP, renal function.  -Continue IV heparin  for anticoagulation.  -Discussed with patients wife that he is quite ill with multiple comorbidities. She expressed understanding and is having continued discussions with palliative care regarding GOC. Prognosis is poor. -Mild troponin elevation most consistent with demand/supply mismatch and not ACS  This patient's plan of care was discussed and created with Dr. Wilburn and he is in agreement.  Signed: Danita Bloch, PA-C  12/15/2024, 8:40 AM [facility]      "

## 2024-12-16 ENCOUNTER — Inpatient Hospital Stay

## 2024-12-16 DIAGNOSIS — A419 Sepsis, unspecified organism: Secondary | ICD-10-CM | POA: Diagnosis present

## 2024-12-16 DIAGNOSIS — L8990 Pressure ulcer of unspecified site, unspecified stage: Secondary | ICD-10-CM | POA: Insufficient documentation

## 2024-12-16 DIAGNOSIS — N390 Urinary tract infection, site not specified: Secondary | ICD-10-CM | POA: Diagnosis present

## 2024-12-16 LAB — CBC
HCT: 35.5 % — ABNORMAL LOW (ref 39.0–52.0)
Hemoglobin: 11.5 g/dL — ABNORMAL LOW (ref 13.0–17.0)
MCH: 26.9 pg (ref 26.0–34.0)
MCHC: 32.4 g/dL (ref 30.0–36.0)
MCV: 83.1 fL (ref 80.0–100.0)
Platelets: 184 10*3/uL (ref 150–400)
RBC: 4.27 MIL/uL (ref 4.22–5.81)
RDW: 15.6 % — ABNORMAL HIGH (ref 11.5–15.5)
WBC: 15.7 10*3/uL — ABNORMAL HIGH (ref 4.0–10.5)
nRBC: 0 % (ref 0.0–0.2)

## 2024-12-16 LAB — GLUCOSE, CAPILLARY
Glucose-Capillary: 152 mg/dL — ABNORMAL HIGH (ref 70–99)
Glucose-Capillary: 175 mg/dL — ABNORMAL HIGH (ref 70–99)

## 2024-12-16 MED ORDER — MIDODRINE HCL 5 MG PO TABS
5.0000 mg | ORAL_TABLET | Freq: Once | ORAL | Status: AC
  Administered 2024-12-16: 5 mg via ORAL
  Filled 2024-12-16: qty 1

## 2024-12-16 MED ORDER — LORAZEPAM 2 MG/ML IJ SOLN: 1.0000 mg | Freq: Once | INTRAMUSCULAR | Status: DC | PRN

## 2024-12-16 MED ORDER — HYDROMORPHONE HCL 1 MG/ML IJ SOLN
0.5000 mg | INTRAMUSCULAR | Status: DC | PRN
  Administered 2024-12-16: 0.5 mg via INTRAVENOUS
  Filled 2024-12-16: qty 0.5

## 2024-12-16 MED ORDER — LORAZEPAM 2 MG/ML IJ SOLN
1.0000 mg | INTRAMUSCULAR | Status: DC | PRN
  Administered 2024-12-16: 1 mg via INTRAVENOUS
  Filled 2024-12-16: qty 1

## 2024-12-16 MED ORDER — SODIUM CHLORIDE 0.9 % IV BOLUS: 250.0000 mL | Freq: Once | INTRAVENOUS | Status: DC

## 2024-12-16 NOTE — Progress Notes (Signed)
 Report given earlier to hospice center. Wife called to let her know ambulance is here for husband. IV ativan  given prior to transport per hospice request

## 2024-12-16 NOTE — Progress Notes (Signed)
 Report called and given to Hospice Nurse. Foley in place, Right wrist IV in place. Wife has belongings.

## 2024-12-16 NOTE — Progress Notes (Signed)
"  ° °      Overnight   NAME: James [name] MRN: [id number] DOB : [birth year]    Date of Service   12/16/2024   HPI/Events of Note   HPI:  79 y.o. male with medical history significant of dementia, CAD STEMI status post PCI and stenting 2016, ischemic cardiomyopathy, chronic HFrEF with LVEF less than 30%, PAF on Eliquis ,  IIDM, brought in by family member for evaluation of worsening of confusion, poor oral intake.   Patient is confused and unable to provide any history, with history of provided by wife at bedside.  Patient started to deteriorated about 10 days ago, when he has had significant decrease of oral intake including meals and fluid, wife however has been able to feed him with his pills.  Last 3 days, patient has become more confused, and on average sleeps 14-15 hours a day, when he was awake he mainly stayed in the bed.  Wife found patient has significant decrease of urine output.  There was no nausea vomiting or diarrhea.  And patient has not been complaining of any pain.  Increasedly UF has been having difficulty to help patient to eat or go to bathroom and eventually she decided to bring him to the hospital.   Overnight:  RR called due to hypotension, RN reports patient was tachycardic in the 150s so she administered the PRN metoprolol  5 mg IV dose. Shortly after he became hypotensive 60s/40s. On arrival to room pt pale, sweaty and sob. Paused amiodarone  gtt,  Ordered 500 ML NS bolus, EKG, and Cxray.   Physical Exam Vitals reviewed.  Constitutional:      General: He is in acute distress.     Appearance: He is ill-appearing.  Eyes:     Pupils: Pupils are equal, round, and reactive to light.  Cardiovascular:     Rate and Rhythm: Tachycardia present. Rhythm irregularly irregular.     Pulses:          Radial pulses are 1+ on the right side and 1+ on the left side.     Heart sounds: S1 normal and S2 normal.  Pulmonary:     Effort: Tachypnea present.  Musculoskeletal:     Right lower  leg: No edema.     Left lower leg: No edema.  Skin:    General: Skin is warm and dry.     Capillary Refill: Capillary refill takes 2 to 3 seconds.  Neurological:     Mental Status: He is alert and oriented to person, place, and time.     GCS: GCS eye subscore is 4. GCS verbal subscore is 5. GCS motor subscore is 6.     Motor: Weakness and tremor present.       Interventions/ Plan   500 ML NS bolus 5 MG midodrine   Pause amiodarone   D/c PRN metoprolol   Chest xray  EKG- Afib with no ST elevation noted.     Updates     Halia Franey Donati- Aram BSN RN CCRN AGACNP-BC Acute Care Nurse Practitioner [facility] [place]  "

## 2024-12-16 NOTE — Progress Notes (Signed)
 [facility] Cardiology  CARDIOLOGY PROGRESS NOTE  Patient ID: James [name] MRN: [id number] DOB/AGE: 1946-08-22 79 y.o.  Admit date: 12/13/2024 Referring Physician Dr. Jens Reason for Consultation heart failure  HPI: Patient sleeping.  Wife at bedside  Review of systems complete and found to be negative unless listed above     Past Medical History:  Diagnosis Date   Asteatotic eczema    BPH (benign prostatic hyperplasia)    CAD (coronary artery disease)    Chronic cough    Chronic cough    Chronic diastolic CHF (congestive heart failure) (HCC)    Ejection fraction < 50%    Erectile dysfunction    Erectile dysfunction    Gait disorder    Gout    Grade I diastolic dysfunction    Heart attack (HCC) 2016   History of heart artery stent    History of ST elevation myocardial infarction (STEMI)    Hypertension associated with type 2 diabetes mellitus (HCC)    Hypogonadism in male    Ischemic cardiomyopathy    Low left ventricular ejection fraction    Mixed Alzheimer's and vascular dementia (HCC)    OSA on CPAP    Paroxysmal atrial fibrillation (HCC)    Sleep apnea    CPAP   Stasis dermatitis of both legs    Stroke (HCC) 2009   Peripheral vision impairment   Type 2 diabetes mellitus (HCC)    Type 2 diabetes mellitus with other specified complication [facility])     Past Surgical History:  Procedure Laterality Date   abdominal cyst surgery     CARDIAC CATHETERIZATION  2016   3 stents   CATARACT EXTRACTION W/PHACO Left 01/25/2024   Procedure: CATARACT EXTRACTION PHACO AND INTRAOCULAR LENS PLACEMENT (IOC) LEFT DIABETIC 7.63, 00:43.3;  Surgeon: Myrna Adine Anes, MD;  Location: [facility];  Service: Ophthalmology;  Laterality: Left;   RETINAL DETACHMENT SURGERY Right 07/22/2023   [facility]    Medications Prior to Admission  Medication Sig Dispense Refill Last Dose/Taking   allopurinol  (ZYLOPRIM ) 100 MG tablet Take 1 tablet by mouth daily.   12/12/2024 Evening   apixaban  (ELIQUIS ) 5  MG TABS tablet Take 5 mg by mouth 2 (two) times daily.   12/12/2024 Evening   aspirin  81 MG tablet Take 81 mg by mouth daily.   12/12/2024 Morning   atorvastatin  (LIPITOR ) 80 MG tablet Take 1 tablet by mouth.   12/12/2024 Morning   buPROPion  (WELLBUTRIN  XL) 150 MG 24 hr tablet Take 150 mg by mouth daily.   12/12/2024 Morning   cyanocobalamin (VITAMIN B12) 1000 MCG tablet Take 1,000 mcg by mouth daily.   12/12/2024 Evening   donepezil  (ARICEPT ) 10 MG tablet TAKE 1 TABLET BY MOUTH EVERY DAY AT NIGHT  2 12/12/2024 Evening   metFORMIN (GLUCOPHAGE-XR) 500 MG 24 hr tablet Take 1,500 mg by mouth daily.   12/12/2024 Evening   metoprolol  succinate (TOPROL -XL) 25 MG 24 hr tablet Take 25 mg by mouth daily.   12/12/2024 Morning   POTASSIUM CHLORIDE PO Take 10 mEq by mouth daily.   12/12/2024 Morning   torsemide (DEMADEX) 20 MG tablet Take 20 mg by mouth daily.   12/12/2024 Morning   Vitamin D, Cholecalciferol, 1000 UNITS CAPS Take by mouth daily.   12/12/2024 Morning   colchicine 0.6 MG tablet Take 2 tablets (1.2mg ) by mouth at first sign of gout flare followed by 1 tablet (0.6mg ) after 1 hour. (Max 1.8mg  within 1 hour) (Patient not taking: Reported on 01/14/2024)  Unknown   memantine  (NAMENDA ) 10 MG tablet Take 10 mg by mouth 2 (two) times daily. (Patient not taking: Reported on 12/13/2024)   Not Taking   neomycin-polymyxin b-dexamethasone (MAXITROL) 3.5-10000-0.1 OINT Place 1 Application into the right eye 4 (four) times daily. (Patient not taking: Reported on 12/13/2024)   Not Taking   nitroGLYCERIN (NITROSTAT) 0.4 MG SL tablet Place under the tongue as needed.   Unknown   ofloxacin (OCUFLOX) 0.3 % ophthalmic solution Place 1 drop into the right eye 4 (four) times daily. (Patient not taking: Reported on 12/13/2024)   Not Taking   prednisoLONE acetate (PRED FORTE) 1 % ophthalmic suspension Place 1 drop into the right eye 4 (four) times daily. (Patient not taking: Reported on 12/13/2024)   Not Taking   sertraline  (ZOLOFT ) 50  MG tablet Take 1 tablet by mouth daily. (Patient not taking: Reported on 12/13/2024)   Not Taking   Social History   Socioeconomic History   Marital status: Married    Spouse name: Not on file   Number of children: Not on file   Years of education: Not on file   Highest education level: Not on file  Occupational History   Not on file  Tobacco Use   Smoking status: Former    Current packs/day: 0.00    Average packs/day: 1.5 packs/day for 25.0 years (37.5 ttl pk-yrs)    Types: Cigarettes    Start date: 11/18/1959    Quit date: 11/17/1984    Years since quitting: 40.1   Smokeless tobacco: Never   Tobacco comments:    years ago  Vaping Use   Vaping status: Never Used  Substance and Sexual Activity   Alcohol use: No    Alcohol/week: 0.0 standard drinks of alcohol   Drug use: No   Sexual activity: Not on file  Other Topics Concern   Not on file  Social History Narrative   Not on file   Social Drivers of Health   Tobacco Use: Medium Risk (10/31/2024)   Received from [facility]   Patient History    Smoking Tobacco Use: Former    Smokeless Tobacco Use: Never    Passive Exposure: Not on file  Financial Resource Strain: Low Risk  (09/07/2024)   Received from [facility]   Overall Financial Resource Strain (CARDIA)    Difficulty of Paying Living Expenses: Not hard at all  Food Insecurity: No Food Insecurity (09/07/2024)   Received from [facility]   Epic    Within the past 12 months, you worried that your food would run out before you got the money to buy more.: Never true    Within the past 12 months, the food you bought just didn't last and you didn't have money to get more.: Never true  Transportation Needs: No Transportation Needs (09/07/2024)   Received from [facility] - Transportation    In the past 12 months, has lack of transportation kept you from medical appointments or from getting  medications?: No    Lack of Transportation (Non-Medical): No  Physical Activity: Not on file  Stress: Not on file  Social Connections: Not on file  Intimate Partner Violence: Not on file  Depression (EYV7-0): Not on file  Alcohol Screen: Not on file  Housing: Low Risk  (09/07/2024)   Received from [facility]   Epic    In the last 12 months, was there a time when  you were not able to pay the mortgage or rent on time?: No    In the past 12 months, how many times have you moved where you were living?: 0    At any time in the past 12 months, were you homeless or living in a shelter (including now)?: No  Utilities: Not At Risk (09/07/2024)   Received from [facility]   Epic    In the past 12 months has the electric, gas, oil, or water company threatened to shut off services in your home?: No  Health Literacy: Not on file    Family History  Problem Relation Age of Onset   Kidney disease Neg Hx    Prostate cancer Neg Hx    Bladder Cancer Neg Hx    Kidney cancer Neg Hx       Review of systems complete and found to be negative unless listed above      PHYSICAL EXAM  +1 pedal edema No obvious Systolic murmur mitral area  Labs:   Lab Results  Component Value Date   WBC 15.7 (H) 12/16/2024   HGB 11.5 (L) 12/16/2024   HCT 35.5 (L) 12/16/2024   MCV 83.1 12/16/2024   PLT 184 12/16/2024    Recent Labs  Lab 12/13/24 0812 12/13/24 1937 12/15/24 0734  NA 134*   < > 135  K 5.8*   < > 4.7  CL 96*   < > 98  CO2 13*   < > 13*  BUN 93*   < > 102*  CREATININE 4.60*   < > 5.30*  CALCIUM  9.3   < > 8.2*  PROT 6.6  --   --   BILITOT 0.9  --   --   ALKPHOS 120  --   --   ALT 22  --   --   AST 51*  --   --   GLUCOSE 322*   < > 222*   < > = values in this interval not displayed.   Lab Results  Component Value Date   CKTOTAL 1,534 (H) 12/13/2024   No results found for: CHOL No results found for: HDL No results found for: LDLCALC No  results found for: TRIG No results found for: CHOLHDL No results found for: LDLDIRECT    Radiology: DG Chest 1 View Result Date: 12/16/2024 EXAM: 1 VIEW(S) XRAY OF THE CHEST 12/16/2024 01:04:42 AM COMPARISON: Portable chest 12/14/2024. CLINICAL HISTORY: SOB (shortness of breath). FINDINGS: LINES, TUBES AND DEVICES: Multiple overlying telemetry leads. LUNGS AND PLEURA: There are moderate left and small right pleural effusions with atelectasis or consolidation continuing to be seen in the left base. The lungs are otherwise clear. No pneumothorax. HEART AND MEDIASTINUM: There is stable cardiomegaly without findings of acute CHF. The mediastinum is stable with a tortuous and calcified aorta. BONES AND SOFT TISSUES: There is advanced thoracic spondylosis. No acute osseous abnormality. IMPRESSION: 1. Moderate left and small right pleural effusions with atelectasis or consolidation in the left base. Overall aeration seems unchanged. 2. Stable cardiomegaly without findings of acute CHF. Electronically signed by: Francis Quam MD 12/16/2024 01:12 AM EST RP Workstation: [id number]   ECHOCARDIOGRAM COMPLETE Result Date: 12/15/2024    ECHOCARDIOGRAM REPORT   Patient Name:   James [name] Date of Exam: 12/14/2024 Medical Rec #:  [id number]    Height:       71.0 in Accession #:    [id number]   Weight:       219.6 lb Date  of Birth:  June 21, 1946     BSA:          2.194 m Patient Age:    78 years     BP:           76/54 mmHg Patient Gender: M            HR:           120 bpm. Exam Location:  [facility] Procedure: 2D Echo, Cardiac Doppler and Color Doppler (Both Spectral and Color            Flow Doppler were utilized during procedure). Indications:     I48.91 Atrial Fibrillation  History:         Patient has no prior history of Echocardiogram examinations.                  Cardiomyopathy, CAD and Previous Myocardial Infarction, Stroke;                  Risk Factors:Hypertension, Diabetes and Sleep Apnea.  Sonographer:      Carl Coma RDCS Referring Phys:  [id number] CARALYN HUDSON Diagnosing Phys: Keller Kirtan Sada IMPRESSIONS  1. Left ventricular ejection fraction, by estimation, is <20%. The left ventricle has severely decreased function. The left ventricle demonstrates regional wall motion abnormalities (see scoring diagram/findings for description). The left ventricular internal cavity size was moderately dilated. There is mild left ventricular hypertrophy. Left ventricular diastolic parameters are consistent with Grade II diastolic dysfunction (pseudonormalization).  2. Right ventricular systolic function is severely reduced. The right ventricular size is mildly enlarged.  3. Left atrial size was mildly dilated.  4. The mitral valve is normal in structure. Moderate to severe mitral valve regurgitation.  5. The aortic valve is tricuspid. Aortic valve regurgitation is not visualized. FINDINGS  Left Ventricle: Left ventricular ejection fraction, by estimation, is <20%. The left ventricle has severely decreased function. The left ventricle demonstrates regional wall motion abnormalities. The left ventricular internal cavity size was moderately dilated. There is mild left ventricular hypertrophy. Left ventricular diastolic parameters are consistent with Grade II diastolic dysfunction (pseudonormalization).  LV Wall Scoring: The mid and distal anterior wall, mid and distal anterior septum, entire apex, and mid inferoseptal segment are akinetic. The antero-lateral wall, inferior wall, posterior wall, basal anteroseptal segment, basal anterior segment, and basal inferoseptal segment are hypokinetic. Right Ventricle: The right ventricular size is mildly enlarged. No increase in right ventricular wall thickness. Right ventricular systolic function is severely reduced. Left Atrium: Left atrial size was mildly dilated. Right Atrium: Right atrial size was normal in size. Pericardium: Trivial pericardial effusion is present. Mitral  Valve: The mitral valve is normal in structure. Moderate to severe mitral valve regurgitation. Tricuspid Valve: The tricuspid valve is normal in structure. Tricuspid valve regurgitation is trivial. Aortic Valve: The aortic valve is tricuspid. Aortic valve regurgitation is not visualized. Pulmonic Valve: The pulmonic valve was not well visualized. Pulmonic valve regurgitation is mild. Aorta: The aortic root is normal in size and structure. IAS/Shunts: The atrial septum is grossly normal. Additional Comments: There is pleural effusion in the left lateral region.  LEFT VENTRICLE PLAX 2D LVIDd:         6.60 cm LVIDs:         6.10 cm LV PW:         1.20 cm LV IVS:        1.10 cm LVOT diam:     2.30 cm LV SV:  17 LV SV Index:   8 LVOT Area:     4.15 cm  RIGHT VENTRICLE            IVC RV Basal diam:  3.90 cm    IVC diam: 1.80 cm RV S prime:     5.44 cm/s TAPSE (M-mode): 1.0 cm LEFT ATRIUM             Index        RIGHT ATRIUM           Index LA diam:        5.10 cm 2.32 cm/m   RA Area:     17.50 cm LA Vol (A2C):   49.7 ml 22.65 ml/m  RA Volume:   47.90 ml  21.83 ml/m LA Vol (A4C):   78.2 ml 35.64 ml/m LA Biplane Vol: 64.2 ml 29.26 ml/m  AORTIC VALVE LVOT Vmax:   36.14 cm/s LVOT Vmean:  23.620 cm/s LVOT VTI:    0.041 m  AORTA Ao Root diam: 3.60 cm Ao Asc diam:  3.90 cm MV E velocity: 104.00 cm/s  TRICUSPID VALVE                             TR Peak grad:   24.2 mmHg                             TR Vmax:        246.00 cm/s                              SHUNTS                             Systemic VTI:  0.04 m                             Systemic Diam: 2.30 cm Keller Paterson Electronically signed by Keller Paterson Signature Date/Time: 12/15/2024/2:05:26 PM    Final    DG Chest 1 View Result Date: 12/14/2024 EXAM: 1 VIEW(S) XRAY OF THE CHEST 12/14/2024 07:07:00 AM COMPARISON: 12/13/2024 CLINICAL HISTORY: Congestive heart failure. FINDINGS: LUNGS AND PLEURA: Stable small left pleural effusion and left basilar  opacity. No pneumothorax. HEART AND MEDIASTINUM: Cardiomegaly, unchanged. Atherosclerotic calcifications. BONES AND SOFT TISSUES: No acute osseous abnormality. IMPRESSION: 1. Stable small left pleural effusion and left basilar opacity. 2. Cardiomegaly, unchanged. Electronically signed by: Waddell Calk MD 12/14/2024 07:10 AM EST RP Workstation: [id number]   DG Chest 1 View Result Date: 12/13/2024 EXAM: 1 VIEW(S) XRAY OF THE CHEST 12/13/2024 06:08:00 PM COMPARISON: 12/13/2024 CLINICAL HISTORY: Congestive heart failure. ICD10: [id number] Congestive heart failure. FINDINGS: LUNGS AND PLEURA: Small left pleural effusion. Similar left retrocardiac opacity. No pneumothorax. HEART AND MEDIASTINUM: Cardiomegaly. BONES AND SOFT TISSUES: No acute osseous abnormality. IMPRESSION: 1. Small left pleural effusion and similar left retrocardiac opacity. No change from the prior exam from earlier in the same day. Electronically signed by: Oneil Devonshire MD 12/13/2024 11:44 PM EST RP Workstation: [id number]   CT CHEST WO CONTRAST Result Date: 12/13/2024 EXAM: CT CHEST WITHOUT CONTRAST 12/13/2024 11:36:35 AM TECHNIQUE: CT of the chest was performed without the administration of intravenous contrast. Multiplanar reformatted images are provided for review. Automated exposure control, iterative reconstruction, and/or weight based adjustment of  the mA/kV was utilized to reduce the radiation dose to as low as reasonably achievable. COMPARISON: None available. CLINICAL HISTORY: Pneumonia, complication suspected, x-ray done. FINDINGS: MEDIASTINUM: Heart: Mild cardiac enlargement. 3-vessel coronary artery calcifications. Vessels: The main pulmonary artery is increased in caliber, measuring 4 cm. Aortic atherosclerosis. Airways: The central airways are clear. LYMPH NODES: No enlarged mediastinal or axillary lymph nodes. LUNGS AND PLEURA: Pleura: Small-to-moderate bilateral pleural effusions are identified, left greater than right. No  pneumothorax. Lungs: No airspace consolidation to suggest pneumonia. Mild dependent ground-glass attenuation is noted within the posterior upper and lower lung zones. Small calcified granuloma identified within the left lower lobe. SOFT TISSUES/BONES: No acute abnormality of the bones or soft tissues. UPPER ABDOMEN: Calcification identified in the liver. No acute abnormality noted within the imaged portions of the upper abdomen. IMPRESSION: 1. No CT evidence of pneumonia. 2. Small-to-moderate bilateral pleural effusions, left greater than right. 3. Increased caliber of the main pulmonary artery compatible with pulmonary artery hypertension. 4. Aortic atherosclerosis and coronary artery calcification. Electronically signed by: Waddell Calk MD 12/13/2024 12:01 PM EST RP Workstation: [id number]   CT ABDOMEN PELVIS WO CONTRAST Result Date: 12/13/2024 EXAM: CT ABDOMEN AND PELVIS WITHOUT CONTRAST 12/13/2024 10:07:26 AM TECHNIQUE: CT of the abdomen and pelvis was performed without the administration of intravenous contrast. Multiplanar reformatted images are provided for review. Automated exposure control, iterative reconstruction, and/or weight-based adjustment of the mA/kV was utilized to reduce the radiation dose to as low as reasonably achievable. COMPARISON: None available. CLINICAL HISTORY: Acute renal failure, right-sided abdominal tenderness. Evaluate ureteral obstruction. FINDINGS: LOWER CHEST: Bilateral layering pleural effusions. LIVER: The liver is unremarkable. GALLBLADDER AND BILE DUCTS: Gallbladder is unremarkable. No biliary ductal dilatation. SPLEEN: No acute abnormality. PANCREAS: No acute abnormality. ADRENAL GLANDS: A non left adrenal adenoma. KIDNEYS, URETERS AND BLADDER: No nephrolithiasis or ureterolithiasis. No obstructive uropathy. No hydronephrosis. No perinephric or periureteral stranding. Several small bladder diverticula are present, indicating bladder outlet obstruction. GI AND BOWEL:  Stomach demonstrates no acute abnormality. There is no bowel obstruction. Appendix is normal. PERITONEUM AND RETROPERITONEUM: No ascites. No free air. VASCULATURE: Aorta is normal in caliber. LYMPH NODES: No lymphadenopathy. REPRODUCTIVE ORGANS: No acute enlargement of the posterior gland 10 mm. BONES AND SOFT TISSUES: No acute osseous abnormality. No focal soft tissue abnormality. IMPRESSION: 1. No nephrolithiasis, ureterolithiasis, or obstructive uropathy. 2. Several small bladder diverticula, potential partial bladder outlet obstruction. 3. No acute findings in the abdomen or pelvis, with normal appendix. 4. Bilateral pleural effusions. Electronically signed by: Norleen Boxer MD 12/13/2024 11:28 AM EST RP Workstation: [id number]   DG Chest Portable 1 View Result Date: 12/13/2024 EXAM: 1 VIEW(S) XRAY OF THE CHEST 12/13/2024 09:41:00 AM COMPARISON: 10/06/2023 CLINICAL HISTORY: Cough. FINDINGS: LUNGS AND PLEURA: Small left pleural effusion. Left lower lobe airspace opacity, possibly intracardiac in location. No pneumothorax. HEART AND MEDIASTINUM: Cardiomegaly. Atherosclerotic calcifications. BONES AND SOFT TISSUES: No acute osseous abnormality. IMPRESSION: 1. Left retrocardiac airspace opacity, atelectasis versus pneumonia. 2. Small left pleural effusion. 3. Cardiomegaly. Electronically signed by: Katheleen Faes MD 12/13/2024 09:46 AM EST RP Workstation: [id number]    EKG: Atrial fibrillation  ASSESSMENT AND PLAN:  Advanced heart failure with reduced EF, LVEF 10% Severe biventricular failure Moderate to severe MR A-fib with RVR AKI on CKD Hypotension Dementia  In A-fib with heart rate 100-110 bpm, continue amiodarone  drip for heart rate control.  Can change to p.o. amiodarone  200 mg twice daily for 10 days followed by 200 mg daily at discharge.  No  other rate control options at this time with soft blood pressure. Continue midodrine  Continue Eliquis  for anticoagulation Guarded prognosis.  Wife is  aware with plans for home hospice  Signed: Keller JAYSON Paterson MD 12/16/2024, 8:13 AM

## 2024-12-16 NOTE — Care Management Important Message (Signed)
 Important Message  Patient Details  Name: James [name] MRN: [id number] Date of Birth: 1946-10-02   Important Message Given:  Yes - Medicare IM     Johannah Rozas 12/16/2024, 1:56 PM

## 2024-12-16 NOTE — Plan of Care (Signed)
  Problem: Fluid Volume: Goal: Ability to maintain a balanced intake and output will improve Outcome: Progressing   Problem: Health Behavior/Discharge Planning: Goal: Ability to identify and utilize available resources and services will improve Outcome: Progressing   Problem: Nutritional: Goal: Maintenance of adequate nutrition will improve Outcome: Progressing

## 2024-12-16 NOTE — Discharge Summary (Addendum)
 " Physician Discharge Summary   Patient: James [name] MRN: [id number] DOB: 10/09/1946  Admit date:     12/13/2024  Discharge date: 12/16/24  Discharge Physician: AIDA CHO   PCP: Lenon Layman ORN, MD   Recommendations at discharge:   Follow-up with the hospice team  Discharge Diagnoses: Principal Problem:   AKI (acute kidney injury) Active Problems:   Acute systolic heart failure (HCC)   Dementia without behavioral disturbance (HCC)   Acute metabolic encephalopathy   A-fib (HCC)   Pressure injury of skin   Severe sepsis (HCC)   Acute UTI  Resolved Problems:   * No resolved hospital problems. *  Hospital Course:  James [name] is a 79 y.o. 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  He had had oral poor oral intake for about 10 days prior to admission.  Wife noticed increasing confusion in the 3 days preceding admission.  Wife also noticed decreased urine output.     ED Course:  Afebrile, tachycardia EKG showed uncontrolled A-fib, blood pressure 112/79 O2 saturation 100% on room air.  Chest x-ray suspicious for left lower lobe pneumonia, blood work showed AKI creatinine 93 BUN 4.6 glucose 322 bicarb 13K 5.8.   Patient was given ceftriaxone  doxycycline , IV bolus of 2000 mL normal saline and 1 dose of IV Lasix  in the ED.    Assessment and Plan:    Acute metabolic encephalopathy on underlying Alzheimer's dementia: Patient is still confused.  Continue supportive care.   Worsening AKI and metabolic acidosis: Possible cardiorenal syndrome complicating AKI.  S/p hydration with IV sodium bicarbonate  infusion.  He was evaluated by the nephrologist.  No plan for hemodialysis because family opted for comfort care discharge to hospice  house. Creatinine up to 5.3, BUN 102. Baseline creatinine 0.94 from July 2024. Foley catheter in place   Hyperkalemia: Improved   Acute on chronic HFrEF, bilateral pleural effusions: Patient could not have adequate diuresis because of hypotension and worsening AKI. 2D echo on 12/14/2024 showed EF of less than 20%, grade 2 diastolic dysfunction. Previous 2D echo in July 2023 showed EF estimated at 30%, grade 1 diastolic dysfunction.   Acute hypoxic respiratory failure: Continue 2 L/min oxygen via [place].   Paroxysmal atrial fibrillation with RVR: He was treated with IV amiodarone  infusion.  He was also seen by cardiologist.   Elevated troponins: This is due to demand ischemia.  Acute NSTEMI ruled out.   Severe sepsis secondary to acute UTI: Urine culture showed Enterobacter cloacae.  He was initially treated with IV ceftriaxone  and then later switched to IV cefepime .   Hypotension: He was treated with midodrine .   Type II DM: He was treated with Lantus  and NovoLog . Hemoglobin A1c 9.0   His condition continued to worsen.  His family requested comfort measures with hospice.  He was evaluated by the hospice team and he was deemed eligible for discharge to hospice house.  He will be discharged to the hospice house today.  Discharge plan was discussed with patient's wife and sons James [name] and James [name]) at the bedside.  They are agreeable with the plan.       Consultants: Cardiologist, nephrologist, palliative care, hospice Procedures performed: None Disposition: Hospice house Diet recommendation:  Regular diet DISCHARGE MEDICATION: Allergies as of 12/16/2024  Reactions   Ramipril    Other reaction(s):   Hiccups   Empagliflozin Other (See Comments)   Other reaction(s): dysuria        Medication List     STOP taking these medications    allopurinol  100 MG tablet Commonly known as: ZYLOPRIM    aspirin  81 MG tablet   atorvastatin  80 MG tablet Commonly known as:  LIPITOR    buPROPion  150 MG 24 hr tablet Commonly known as: WELLBUTRIN  XL   colchicine 0.6 MG tablet   cyanocobalamin 1000 MCG tablet Commonly known as: VITAMIN B12   donepezil  10 MG tablet Commonly known as: ARICEPT    Eliquis  5 MG Tabs tablet Generic drug: apixaban    memantine  10 MG tablet Commonly known as: NAMENDA    metFORMIN 500 MG 24 hr tablet Commonly known as: GLUCOPHAGE-XR   metoprolol  succinate 25 MG 24 hr tablet Commonly known as: TOPROL -XL   neomycin-polymyxin b-dexamethasone 3.5-10000-0.1 Oint Commonly known as: MAXITROL   nitroGLYCERIN 0.4 MG SL tablet Commonly known as: NITROSTAT   ofloxacin 0.3 % ophthalmic solution Commonly known as: OCUFLOX   POTASSIUM CHLORIDE PO   prednisoLONE acetate 1 % ophthalmic suspension Commonly known as: PRED FORTE   sertraline  50 MG tablet Commonly known as: ZOLOFT    torsemide 20 MG tablet Commonly known as: DEMADEX   Vitamin D (Cholecalciferol) 25 MCG (1000 UT) Caps        Follow-up Information     Clarisa Kung, PA-C. Go in 1 week(s).   Contact information: [address] [facility] [place] [address] [phone]                Discharge Exam: Filed Weights   12/13/24 0809  Weight: 99.6 kg   GEN: NAD SKIN: Warm and dry EYES: No pallor or icterus ENT: MMM CV: RRR PULM: Bibasilar rales.  No wheezing ABD: soft, ND, NT, +BS CNS: Alert but disoriented and confused.  He does not follow commands. EXT: No edema or tenderness GU: Foley catheter draining amber urine    Condition at discharge: worsening  The results of significant diagnostics from this hospitalization (including imaging, microbiology, ancillary and laboratory) are listed below for reference.   Imaging Studies: DG Chest 1 View Result Date: 12/16/2024 EXAM: 1 VIEW(S) XRAY OF THE CHEST 12/16/2024 01:04:42 AM COMPARISON: Portable chest 12/14/2024. CLINICAL HISTORY: SOB (shortness of breath). FINDINGS: LINES, TUBES AND  DEVICES: Multiple overlying telemetry leads. LUNGS AND PLEURA: There are moderate left and small right pleural effusions with atelectasis or consolidation continuing to be seen in the left base. The lungs are otherwise clear. No pneumothorax. HEART AND MEDIASTINUM: There is stable cardiomegaly without findings of acute CHF. The mediastinum is stable with a tortuous and calcified aorta. BONES AND SOFT TISSUES: There is advanced thoracic spondylosis. No acute osseous abnormality. IMPRESSION: 1. Moderate left and small right pleural effusions with atelectasis or consolidation in the left base. Overall aeration seems unchanged. 2. Stable cardiomegaly without findings of acute CHF. Electronically signed by: Francis Quam MD 12/16/2024 01:12 AM EST RP Workstation: [id number]   ECHOCARDIOGRAM COMPLETE Result Date: 12/15/2024    ECHOCARDIOGRAM REPORT   Patient Name:   James [name] Date of Exam: 12/14/2024 Medical Rec #:  [id number]    Height:       71.0 in Accession #:    [id number]   Weight:       219.6 lb Date of Birth:  July 03, 1946     BSA:          2.194 m  Patient Age:    78 years     BP:           76/54 mmHg Patient Gender: M            HR:           120 bpm. Exam Location:  [facility] Procedure: 2D Echo, Cardiac Doppler and Color Doppler (Both Spectral and Color            Flow Doppler were utilized during procedure). Indications:     I48.91 Atrial Fibrillation  History:         Patient has no prior history of Echocardiogram examinations.                  Cardiomyopathy, CAD and Previous Myocardial Infarction, Stroke;                  Risk Factors:Hypertension, Diabetes and Sleep Apnea.  Sonographer:     Carl Coma RDCS Referring Phys:  [id number] CARALYN HUDSON Diagnosing Phys: Keller Alluri IMPRESSIONS  1. Left ventricular ejection fraction, by estimation, is <20%. The left ventricle has severely decreased function. The left ventricle demonstrates regional wall motion abnormalities (see scoring diagram/findings  for description). The left ventricular internal cavity size was moderately dilated. There is mild left ventricular hypertrophy. Left ventricular diastolic parameters are consistent with Grade II diastolic dysfunction (pseudonormalization).  2. Right ventricular systolic function is severely reduced. The right ventricular size is mildly enlarged.  3. Left atrial size was mildly dilated.  4. The mitral valve is normal in structure. Moderate to severe mitral valve regurgitation.  5. The aortic valve is tricuspid. Aortic valve regurgitation is not visualized. FINDINGS  Left Ventricle: Left ventricular ejection fraction, by estimation, is <20%. The left ventricle has severely decreased function. The left ventricle demonstrates regional wall motion abnormalities. The left ventricular internal cavity size was moderately dilated. There is mild left ventricular hypertrophy. Left ventricular diastolic parameters are consistent with Grade II diastolic dysfunction (pseudonormalization).  LV Wall Scoring: The mid and distal anterior wall, mid and distal anterior septum, entire apex, and mid inferoseptal segment are akinetic. The antero-lateral wall, inferior wall, posterior wall, basal anteroseptal segment, basal anterior segment, and basal inferoseptal segment are hypokinetic. Right Ventricle: The right ventricular size is mildly enlarged. No increase in right ventricular wall thickness. Right ventricular systolic function is severely reduced. Left Atrium: Left atrial size was mildly dilated. Right Atrium: Right atrial size was normal in size. Pericardium: Trivial pericardial effusion is present. Mitral Valve: The mitral valve is normal in structure. Moderate to severe mitral valve regurgitation. Tricuspid Valve: The tricuspid valve is normal in structure. Tricuspid valve regurgitation is trivial. Aortic Valve: The aortic valve is tricuspid. Aortic valve regurgitation is not visualized. Pulmonic Valve: The pulmonic valve was  not well visualized. Pulmonic valve regurgitation is mild. Aorta: The aortic root is normal in size and structure. IAS/Shunts: The atrial septum is grossly normal. Additional Comments: There is pleural effusion in the left lateral region.  LEFT VENTRICLE PLAX 2D LVIDd:         6.60 cm LVIDs:         6.10 cm LV PW:         1.20 cm LV IVS:        1.10 cm LVOT diam:     2.30 cm LV SV:         17 LV SV Index:   8 LVOT Area:     4.15 cm  RIGHT VENTRICLE  IVC RV Basal diam:  3.90 cm    IVC diam: 1.80 cm RV S prime:     5.44 cm/s TAPSE (M-mode): 1.0 cm LEFT ATRIUM             Index        RIGHT ATRIUM           Index LA diam:        5.10 cm 2.32 cm/m   RA Area:     17.50 cm LA Vol (A2C):   49.7 ml 22.65 ml/m  RA Volume:   47.90 ml  21.83 ml/m LA Vol (A4C):   78.2 ml 35.64 ml/m LA Biplane Vol: 64.2 ml 29.26 ml/m  AORTIC VALVE LVOT Vmax:   36.14 cm/s LVOT Vmean:  23.620 cm/s LVOT VTI:    0.041 m  AORTA Ao Root diam: 3.60 cm Ao Asc diam:  3.90 cm MV E velocity: 104.00 cm/s  TRICUSPID VALVE                             TR Peak grad:   24.2 mmHg                             TR Vmax:        246.00 cm/s                              SHUNTS                             Systemic VTI:  0.04 m                             Systemic Diam: 2.30 cm Keller Paterson Electronically signed by Keller Paterson Signature Date/Time: 12/15/2024/2:05:26 PM    Final    DG Chest 1 View Result Date: 12/14/2024 EXAM: 1 VIEW(S) XRAY OF THE CHEST 12/14/2024 07:07:00 AM COMPARISON: 12/13/2024 CLINICAL HISTORY: Congestive heart failure. FINDINGS: LUNGS AND PLEURA: Stable small left pleural effusion and left basilar opacity. No pneumothorax. HEART AND MEDIASTINUM: Cardiomegaly, unchanged. Atherosclerotic calcifications. BONES AND SOFT TISSUES: No acute osseous abnormality. IMPRESSION: 1. Stable small left pleural effusion and left basilar opacity. 2. Cardiomegaly, unchanged. Electronically signed by: Waddell Calk MD 12/14/2024 07:10 AM EST RP  Workstation: [id number]   DG Chest 1 View Result Date: 12/13/2024 EXAM: 1 VIEW(S) XRAY OF THE CHEST 12/13/2024 06:08:00 PM COMPARISON: 12/13/2024 CLINICAL HISTORY: Congestive heart failure. ICD10: [id number] Congestive heart failure. FINDINGS: LUNGS AND PLEURA: Small left pleural effusion. Similar left retrocardiac opacity. No pneumothorax. HEART AND MEDIASTINUM: Cardiomegaly. BONES AND SOFT TISSUES: No acute osseous abnormality. IMPRESSION: 1. Small left pleural effusion and similar left retrocardiac opacity. No change from the prior exam from earlier in the same day. Electronically signed by: Oneil Devonshire MD 12/13/2024 11:44 PM EST RP Workstation: [id number]   CT CHEST WO CONTRAST Result Date: 12/13/2024 EXAM: CT CHEST WITHOUT CONTRAST 12/13/2024 11:36:35 AM TECHNIQUE: CT of the chest was performed without the administration of intravenous contrast. Multiplanar reformatted images are provided for review. Automated exposure control, iterative reconstruction, and/or weight based adjustment of the mA/kV was utilized to reduce the radiation dose to as low as reasonably achievable. COMPARISON: None available. CLINICAL HISTORY: Pneumonia, complication suspected, x-ray done. FINDINGS: MEDIASTINUM: Heart: Mild  cardiac enlargement. 3-vessel coronary artery calcifications. Vessels: The main pulmonary artery is increased in caliber, measuring 4 cm. Aortic atherosclerosis. Airways: The central airways are clear. LYMPH NODES: No enlarged mediastinal or axillary lymph nodes. LUNGS AND PLEURA: Pleura: Small-to-moderate bilateral pleural effusions are identified, left greater than right. No pneumothorax. Lungs: No airspace consolidation to suggest pneumonia. Mild dependent ground-glass attenuation is noted within the posterior upper and lower lung zones. Small calcified granuloma identified within the left lower lobe. SOFT TISSUES/BONES: No acute abnormality of the bones or soft tissues. UPPER ABDOMEN: Calcification identified in  the liver. No acute abnormality noted within the imaged portions of the upper abdomen. IMPRESSION: 1. No CT evidence of pneumonia. 2. Small-to-moderate bilateral pleural effusions, left greater than right. 3. Increased caliber of the main pulmonary artery compatible with pulmonary artery hypertension. 4. Aortic atherosclerosis and coronary artery calcification. Electronically signed by: Waddell Calk MD 12/13/2024 12:01 PM EST RP Workstation: [id number]   CT ABDOMEN PELVIS WO CONTRAST Result Date: 12/13/2024 EXAM: CT ABDOMEN AND PELVIS WITHOUT CONTRAST 12/13/2024 10:07:26 AM TECHNIQUE: CT of the abdomen and pelvis was performed without the administration of intravenous contrast. Multiplanar reformatted images are provided for review. Automated exposure control, iterative reconstruction, and/or weight-based adjustment of the mA/kV was utilized to reduce the radiation dose to as low as reasonably achievable. COMPARISON: None available. CLINICAL HISTORY: Acute renal failure, right-sided abdominal tenderness. Evaluate ureteral obstruction. FINDINGS: LOWER CHEST: Bilateral layering pleural effusions. LIVER: The liver is unremarkable. GALLBLADDER AND BILE DUCTS: Gallbladder is unremarkable. No biliary ductal dilatation. SPLEEN: No acute abnormality. PANCREAS: No acute abnormality. ADRENAL GLANDS: A non left adrenal adenoma. KIDNEYS, URETERS AND BLADDER: No nephrolithiasis or ureterolithiasis. No obstructive uropathy. No hydronephrosis. No perinephric or periureteral stranding. Several small bladder diverticula are present, indicating bladder outlet obstruction. GI AND BOWEL: Stomach demonstrates no acute abnormality. There is no bowel obstruction. Appendix is normal. PERITONEUM AND RETROPERITONEUM: No ascites. No free air. VASCULATURE: Aorta is normal in caliber. LYMPH NODES: No lymphadenopathy. REPRODUCTIVE ORGANS: No acute enlargement of the posterior gland 10 mm. BONES AND SOFT TISSUES: No acute osseous abnormality.  No focal soft tissue abnormality. IMPRESSION: 1. No nephrolithiasis, ureterolithiasis, or obstructive uropathy. 2. Several small bladder diverticula, potential partial bladder outlet obstruction. 3. No acute findings in the abdomen or pelvis, with normal appendix. 4. Bilateral pleural effusions. Electronically signed by: Norleen Boxer MD 12/13/2024 11:28 AM EST RP Workstation: [id number]   DG Chest Portable 1 View Result Date: 12/13/2024 EXAM: 1 VIEW(S) XRAY OF THE CHEST 12/13/2024 09:41:00 AM COMPARISON: 10/06/2023 CLINICAL HISTORY: Cough. FINDINGS: LUNGS AND PLEURA: Small left pleural effusion. Left lower lobe airspace opacity, possibly intracardiac in location. No pneumothorax. HEART AND MEDIASTINUM: Cardiomegaly. Atherosclerotic calcifications. BONES AND SOFT TISSUES: No acute osseous abnormality. IMPRESSION: 1. Left retrocardiac airspace opacity, atelectasis versus pneumonia. 2. Small left pleural effusion. 3. Cardiomegaly. Electronically signed by: Katheleen Faes MD 12/13/2024 09:46 AM EST RP Workstation: [id number]    Microbiology: Results for orders placed or performed during the hospital encounter of 12/13/24  Urine Culture     Status: Abnormal   Collection Time: 12/13/24  8:41 AM   Specimen: Urine, Clean Catch  Result Value Ref Range Status   Specimen Description   Final    URINE, CLEAN CATCH Performed at [facility], [address] [place], [place] [address]    Special Requests   Final    NONE Performed at [facility], [address] [place], [place] [address]    Culture >=100,000 COLONIES/mL  ENTEROBACTER CLOACAE (A)  Final   Report Status 12/15/2024 FINAL  Final   Organism ID, Bacteria ENTEROBACTER CLOACAE (A)  Final      Susceptibility   Enterobacter cloacae - MIC*    CEFEPIME  <=0.12 SENSITIVE Sensitive     ERTAPENEM <=0.12 SENSITIVE Sensitive     CIPROFLOXACIN <=0.06 SENSITIVE Sensitive     GENTAMICIN <=1 SENSITIVE Sensitive     NITROFURANTOIN 64  INTERMEDIATE Intermediate     TRIMETH /SULFA  <=20 SENSITIVE Sensitive     PIP/TAZO Value in next row Sensitive      <=4 SENSITIVEThis is a modified FDA-approved test that has been validated and its performance characteristics determined by the reporting laboratory.  This laboratory is certified under the Clinical Laboratory Improvement Amendments CLIA as qualified to perform high complexity clinical laboratory testing.    MEROPENEM Value in next row Sensitive      <=4 SENSITIVEThis is a modified FDA-approved test that has been validated and its performance characteristics determined by the reporting laboratory.  This laboratory is certified under the Clinical Laboratory Improvement Amendments CLIA as qualified to perform high complexity clinical laboratory testing.    * >=100,000 COLONIES/mL ENTEROBACTER CLOACAE  Resp panel by RT-PCR (RSV, Flu A&B, Covid) Anterior Nasal Swab     Status: None   Collection Time: 12/13/24  8:49 AM   Specimen: Anterior Nasal Swab  Result Value Ref Range Status   SARS Coronavirus 2 by RT PCR NEGATIVE NEGATIVE Final    Comment: (NOTE) SARS-CoV-2 target nucleic acids are NOT DETECTED.  The SARS-CoV-2 RNA is generally detectable in upper respiratory specimens during the acute phase of infection. The lowest concentration of SARS-CoV-2 viral copies this assay can detect is 138 copies/mL. A negative result does not preclude SARS-Cov-2 infection and should not be used as the sole basis for treatment or other patient management decisions. A negative result may occur with  improper specimen collection/handling, submission of specimen other than nasopharyngeal swab, presence of viral mutation(s) within the areas targeted by this assay, and inadequate number of viral copies(<138 copies/mL). A negative result must be combined with clinical observations, patient history, and epidemiological information. The expected result is Negative.  Fact Sheet for Patients:   bloggercourse.com  Fact Sheet for Healthcare Providers:  seriousbroker.it  This test is no t yet approved or cleared by the [place]  FDA and  has been authorized for detection and/or diagnosis of SARS-CoV-2 by FDA under an Emergency Use Authorization (EUA). This EUA will remain  in effect (meaning this test can be used) for the duration of the COVID-19 declaration under Section 564(b)(1) of the Act, 21 U.S.C.section 360bbb-3(b)(1), unless the authorization is terminated  or revoked sooner.       Influenza A by PCR NEGATIVE NEGATIVE Final   Influenza B by PCR NEGATIVE NEGATIVE Final    Comment: (NOTE) The Xpert Xpress SARS-CoV-2/FLU/RSV plus assay is intended as an aid in the diagnosis of influenza from Nasopharyngeal swab specimens and should not be used as a sole basis for treatment. Nasal washings and aspirates are unacceptable for Xpert Xpress SARS-CoV-2/FLU/RSV testing.  Fact Sheet for Patients: bloggercourse.com  Fact Sheet for Healthcare Providers: seriousbroker.it  This test is not yet approved or cleared by the [place]  FDA and has been authorized for detection and/or diagnosis of SARS-CoV-2 by FDA under an Emergency Use Authorization (EUA). This EUA will remain in effect (meaning this test can be used) for the duration of the COVID-19 declaration under Section 564(b)(1) of the Act, 21  U.S.C. section 360bbb-3(b)(1), unless the authorization is terminated or revoked.     Resp Syncytial Virus by PCR NEGATIVE NEGATIVE Final    Comment: (NOTE) Fact Sheet for Patients: bloggercourse.com  Fact Sheet for Healthcare Providers: seriousbroker.it  This test is not yet approved or cleared by the [place]  FDA and has been authorized for detection and/or diagnosis of SARS-CoV-2 by FDA under an Emergency Use  Authorization (EUA). This EUA will remain in effect (meaning this test can be used) for the duration of the COVID-19 declaration under Section 564(b)(1) of the Act, 21 U.S.C. section 360bbb-3(b)(1), unless the authorization is terminated or revoked.  Performed at [facility], [address] [place], [place] [address]   Blood culture (routine x 2)     Status: None (Preliminary result)   Collection Time: 12/13/24  9:25 AM   Specimen: BLOOD  Result Value Ref Range Status   Specimen Description BLOOD BLOOD LEFT ARM  Final   Special Requests   Final    BOTTLES DRAWN AEROBIC AND ANAEROBIC Blood Culture adequate volume   Culture   Final    NO GROWTH 3 DAYS Performed at [facility], [address] [place], [place] [address]    Report Status PENDING  Incomplete  Blood culture (routine x 2)     Status: None (Preliminary result)   Collection Time: 12/13/24  9:25 AM   Specimen: BLOOD  Result Value Ref Range Status   Specimen Description BLOOD BLOOD LEFT ARM  Final   Special Requests   Final    BOTTLES DRAWN AEROBIC AND ANAEROBIC Blood Culture results may not be optimal due to an inadequate volume of blood received in culture bottles   Culture   Final    NO GROWTH 3 DAYS Performed at [facility], [address] [place], [place] [address]    Report Status PENDING  Incomplete    Labs: CBC: Recent Labs  Lab 12/13/24 0812 12/14/24 0436 12/15/24 0734 12/16/24 0346  WBC 14.6* 12.0* 12.1* 15.7*  NEUTROABS 12.4*  --   --   --   HGB 11.4* 11.0* 11.2* 11.5*  HCT 37.3* 35.2* 35.2* 35.5*  MCV 86.1 83.2 83.2 83.1  PLT 329 210 181 184   Basic Metabolic Panel: Recent Labs  Lab 12/13/24 0812 12/13/24 0841 12/13/24 1937 12/14/24 0436 12/15/24 0734  NA 134*  --  136 136 135  K 5.8*  --  4.7 5.0 4.7  CL 96*  --  101 100 98  CO2 13*  --  16* 13* 13*  GLUCOSE 322*  --  154* 260* 222*  BUN 93*  --  90* 93* 102*  CREATININE 4.60*  --  4.22* 4.46* 5.30*   CALCIUM  9.3  --  8.4* 8.4* 8.2*  MG  --  2.3  --   --   --   PHOS  --  7.5*  --   --   --    Liver Function Tests: Recent Labs  Lab 12/13/24 0812  AST 51*  ALT 22  ALKPHOS 120  BILITOT 0.9  PROT 6.6  ALBUMIN 3.8   CBG: Recent Labs  Lab 12/15/24 1210 12/15/24 1621 12/15/24 2102 12/16/24 0039 12/16/24 0833  GLUCAP 210* 185* 176* 152* 175*    Discharge time spent: greater than 30 minutes.  Signed: AIDA CHO, MD [facility] 12/16/2024 "

## 2024-12-16 NOTE — Plan of Care (Addendum)
 Amiodarone  stop because bp was 62/53(58). Per Laneta, NP Problem: Education: Goal: Ability to describe self-care measures that may prevent or decrease complications (Diabetes Survival Skills Education) will improve Outcome: Progressing Goal: Individualized Educational Video(s) Outcome: Progressing   Problem: Coping: Goal: Ability to adjust to condition or change in health will improve Outcome: Progressing   Problem: Fluid Volume: Goal: Ability to maintain a balanced intake and output will improve Outcome: Progressing   Problem: Health Behavior/Discharge Planning: Goal: Ability to identify and utilize available resources and services will improve Outcome: Progressing Goal: Ability to manage health-related needs will improve Outcome: Progressing   Problem: Metabolic: Goal: Ability to maintain appropriate glucose levels will improve Outcome: Progressing   Problem: Nutritional: Goal: Maintenance of adequate nutrition will improve Outcome: Progressing Goal: Progress toward achieving an optimal weight will improve Outcome: Progressing   Problem: Skin Integrity: Goal: Risk for impaired skin integrity will decrease Outcome: Progressing   Problem: Tissue Perfusion: Goal: Adequacy of tissue perfusion will improve Outcome: Progressing   Problem: Education: Goal: Knowledge of General Education information will improve Description: Including pain rating scale, medication(s)/side effects and non-pharmacologic comfort measures Outcome: Progressing   Problem: Health Behavior/Discharge Planning: Goal: Ability to manage health-related needs will improve Outcome: Progressing   Problem: Clinical Measurements: Goal: Ability to maintain clinical measurements within normal limits will improve Outcome: Progressing Goal: Will remain free from infection Outcome: Progressing Goal: Diagnostic test results will improve Outcome: Progressing Goal: Respiratory complications will  improve Outcome: Progressing Goal: Cardiovascular complication will be avoided Outcome: Progressing   Problem: Activity: Goal: Risk for activity intolerance will decrease Outcome: Progressing   Problem: Nutrition: Goal: Adequate nutrition will be maintained Outcome: Progressing   Problem: Coping: Goal: Level of anxiety will decrease Outcome: Progressing   Problem: Elimination: Goal: Will not experience complications related to bowel motility Outcome: Progressing Goal: Will not experience complications related to urinary retention Outcome: Progressing   Problem: Pain Managment: Goal: General experience of comfort will improve and/or be controlled Outcome: Progressing   Problem: Safety: Goal: Ability to remain free from injury will improve Outcome: Progressing   Problem: Skin Integrity: Goal: Risk for impaired skin integrity will decrease Outcome: Progressing

## 2024-12-16 NOTE — TOC Transition Note (Signed)
 Transition of Care [facility]) - Discharge Note   Patient Details  Name: James [name] MRN: [id number] Date of Birth: 02/17/1946  Transition of Care [facility]) CM/SW Contact:  Lauraine JAYSON Carpen, LCSW Phone Number: 12/16/2024, 11:05 AM   Clinical Narrative:  Patient has orders to discharge to the [facility] today. RN has already called report. [facility] liaison will arrange [employer], [employer]/clothing. DNR and transport paperwork are in the discharge packet. No further concerns. CSW signing off.   Final next level of care: Hospice Medical Facility Barriers to Discharge: Barriers Resolved   Patient Goals and CMS Choice            Discharge Placement                Patient to be transferred to facility by: [facility]   Patient and family notified of of transfer: 12/16/24  Discharge Plan and Services Additional resources added to the After Visit Summary for       Post Acute Care Choice: Hospice                               Social Drivers of Health (SDOH) Interventions SDOH Screenings   Food Insecurity: No Food Insecurity (12/16/2024)  Housing: Low Risk (12/16/2024)  Transportation Needs: No Transportation Needs (12/16/2024)  Utilities: Not At Risk (12/16/2024)  Financial Resource Strain: Low Risk  (09/07/2024)   Received from [facility]  Social Connections: Socially Integrated (12/16/2024)  Tobacco Use: Medium Risk (10/31/2024)   Received from [facility]     Readmission Risk Interventions     No data to display

## 2024-12-16 NOTE — Plan of Care (Signed)
" °  Problem: Education: Goal: Ability to describe self-care measures that may prevent or decrease complications (Diabetes Survival Skills Education) will improve Outcome: Adequate for Discharge Goal: Individualized Educational Video(s) Outcome: Adequate for Discharge   Problem: Coping: Goal: Ability to adjust to condition or change in health will improve Outcome: Adequate for Discharge   Problem: Fluid Volume: Goal: Ability to maintain a balanced intake and output will improve 12/16/2024 1036 by Jazzlin Clements K, RN Outcome: Adequate for Discharge 12/16/2024 1029 by Freeda Leon POUR, RN Outcome: Progressing   Problem: Health Behavior/Discharge Planning: Goal: Ability to identify and utilize available resources and services will improve 12/16/2024 1036 by Freeda Leon POUR, RN Outcome: Adequate for Discharge 12/16/2024 1029 by Freeda Leon POUR, RN Outcome: Progressing Goal: Ability to manage health-related needs will improve Outcome: Adequate for Discharge   Problem: Metabolic: Goal: Ability to maintain appropriate glucose levels will improve Outcome: Adequate for Discharge   Problem: Nutritional: Goal: Maintenance of adequate nutrition will improve 12/16/2024 1036 by Freeda Leon POUR, RN Outcome: Adequate for Discharge 12/16/2024 1029 by Freeda Leon POUR, RN Outcome: Progressing Goal: Progress toward achieving an optimal weight will improve Outcome: Adequate for Discharge   Problem: Skin Integrity: Goal: Risk for impaired skin integrity will decrease Outcome: Adequate for Discharge   Problem: Tissue Perfusion: Goal: Adequacy of tissue perfusion will improve Outcome: Adequate for Discharge   Problem: Education: Goal: Knowledge of General Education information will improve Description: Including pain rating scale, medication(s)/side effects and non-pharmacologic comfort measures Outcome: Adequate for Discharge   Problem: Health Behavior/Discharge Planning: Goal: Ability to  manage health-related needs will improve Outcome: Adequate for Discharge   Problem: Clinical Measurements: Goal: Ability to maintain clinical measurements within normal limits will improve Outcome: Adequate for Discharge Goal: Will remain free from infection Outcome: Adequate for Discharge Goal: Diagnostic test results will improve Outcome: Adequate for Discharge Goal: Respiratory complications will improve Outcome: Adequate for Discharge Goal: Cardiovascular complication will be avoided Outcome: Adequate for Discharge   Problem: Activity: Goal: Risk for activity intolerance will decrease Outcome: Adequate for Discharge   Problem: Nutrition: Goal: Adequate nutrition will be maintained Outcome: Adequate for Discharge   Problem: Coping: Goal: Level of anxiety will decrease Outcome: Adequate for Discharge   Problem: Elimination: Goal: Will not experience complications related to bowel motility Outcome: Adequate for Discharge Goal: Will not experience complications related to urinary retention Outcome: Adequate for Discharge   Problem: Pain Managment: Goal: General experience of comfort will improve and/or be controlled Outcome: Adequate for Discharge   Problem: Safety: Goal: Ability to remain free from injury will improve Outcome: Adequate for Discharge   Problem: Skin Integrity: Goal: Risk for impaired skin integrity will decrease Outcome: Adequate for Discharge   "

## 2024-12-16 NOTE — Progress Notes (Signed)
 Rapid Response Event Note   Reason for Call: Hypotension   Initial Focused Assessment:  RR RN in to see patient. Primary nurse states that patient had been tachycardic with HR in the 150s, so she administered 5 mg of metoprolol  per PRN order. Soon after, a repeat BP was checked and he was found to be hypotensive with a BP of 62/53 (58). Patient complaining of feeling weak and he is diaphoretic. He appeared short of breath and fingers were cold, which made it difficult to assess SPO2. Probe site changed to earlobe and SPO2 found to be 93%. Radial pulse difficult to palpate but was verified by doppler to be in the 80-90s. Patient responds to voice and is able to answer questions appropriately. On amiodarone  drip. Family at bedside able to provide additional information about the patient. Natalie, NP, in to assess the patient.  Interventions:  - 500 mL NS Bolus - Pause amio drip - Chest x-ray - 5 mg midodrine  - 12-lead EKG  Plan of Care:  After initiating fluid, BP gradually improving, last one 98/59 (71). 2A staff will continue to recheck BP to ensure it does not drop again.   Event Summary:   MD Notified: Laneta Boston, NP Call Time: 0036 Arrival Time: 9960 End Time: 0100  Arlean FORBES Bowers, RN

## 2024-12-18 LAB — CULTURE, BLOOD (ROUTINE X 2)
Culture: NO GROWTH
Culture: NO GROWTH
Special Requests: ADEQUATE
